# Patient Record
Sex: Female | Born: 1945 | Race: White | Hispanic: No | Smoking: Never smoker
Health system: Southern US, Community
[De-identification: ages and names within clinical notes are randomized; demographics above are authoritative.]

## PROBLEM LIST (undated history)

## (undated) DIAGNOSIS — E890 Postprocedural hypothyroidism: Secondary | ICD-10-CM

## (undated) DIAGNOSIS — T7840XA Allergy, unspecified, initial encounter: Secondary | ICD-10-CM

## (undated) DIAGNOSIS — N840 Polyp of corpus uteri: Secondary | ICD-10-CM

## (undated) DIAGNOSIS — R1900 Intra-abdominal and pelvic swelling, mass and lump, unspecified site: Secondary | ICD-10-CM

## (undated) DIAGNOSIS — I059 Rheumatic mitral valve disease, unspecified: Secondary | ICD-10-CM

## (undated) DIAGNOSIS — M810 Age-related osteoporosis without current pathological fracture: Secondary | ICD-10-CM

## (undated) DIAGNOSIS — I10 Essential (primary) hypertension: Secondary | ICD-10-CM

## (undated) DIAGNOSIS — Z973 Presence of spectacles and contact lenses: Secondary | ICD-10-CM

## (undated) DIAGNOSIS — I340 Nonrheumatic mitral (valve) insufficiency: Secondary | ICD-10-CM

## (undated) DIAGNOSIS — M8580 Other specified disorders of bone density and structure, unspecified site: Secondary | ICD-10-CM

## (undated) DIAGNOSIS — E079 Disorder of thyroid, unspecified: Secondary | ICD-10-CM

## (undated) DIAGNOSIS — E210 Primary hyperparathyroidism: Secondary | ICD-10-CM

## (undated) DIAGNOSIS — I341 Nonrheumatic mitral (valve) prolapse: Secondary | ICD-10-CM

## (undated) DIAGNOSIS — E785 Hyperlipidemia, unspecified: Secondary | ICD-10-CM

## (undated) HISTORY — PX: BREAST SURGERY: SHX581

## (undated) HISTORY — DX: Hyperlipidemia, unspecified: E78.5

## (undated) HISTORY — DX: Allergy, unspecified, initial encounter: T78.40XA

## (undated) HISTORY — DX: Other specified disorders of bone density and structure, unspecified site: M85.80

## (undated) HISTORY — PX: BREAST BIOPSY: SHX20

## (undated) HISTORY — PX: COLONOSCOPY: SHX174

## (undated) HISTORY — DX: Rheumatic mitral valve disease, unspecified: I05.9

## (undated) HISTORY — PX: WISDOM TOOTH EXTRACTION: SHX21

## (undated) HISTORY — DX: Disorder of thyroid, unspecified: E07.9

## (undated) HISTORY — DX: Intra-abdominal and pelvic swelling, mass and lump, unspecified site: R19.00

---

## 1988-03-15 HISTORY — PX: BREAST SURGERY: SHX581

## 1998-05-06 ENCOUNTER — Other Ambulatory Visit: Admission: RE | Admit: 1998-05-06 | Discharge: 1998-05-06 | Payer: Self-pay | Admitting: Obstetrics and Gynecology

## 1999-08-12 ENCOUNTER — Other Ambulatory Visit: Admission: RE | Admit: 1999-08-12 | Discharge: 1999-08-12 | Payer: Self-pay | Admitting: Obstetrics and Gynecology

## 2000-09-19 ENCOUNTER — Other Ambulatory Visit: Admission: RE | Admit: 2000-09-19 | Discharge: 2000-09-19 | Payer: Self-pay | Admitting: Obstetrics and Gynecology

## 2001-10-06 ENCOUNTER — Other Ambulatory Visit: Admission: RE | Admit: 2001-10-06 | Discharge: 2001-10-06 | Payer: Self-pay | Admitting: Obstetrics and Gynecology

## 2002-11-05 ENCOUNTER — Other Ambulatory Visit: Admission: RE | Admit: 2002-11-05 | Discharge: 2002-11-05 | Payer: Self-pay | Admitting: Obstetrics and Gynecology

## 2003-11-11 ENCOUNTER — Other Ambulatory Visit: Admission: RE | Admit: 2003-11-11 | Discharge: 2003-11-11 | Payer: Self-pay | Admitting: Obstetrics and Gynecology

## 2004-11-12 ENCOUNTER — Other Ambulatory Visit: Admission: RE | Admit: 2004-11-12 | Discharge: 2004-11-12 | Payer: Self-pay | Admitting: Obstetrics and Gynecology

## 2005-11-16 ENCOUNTER — Other Ambulatory Visit: Admission: RE | Admit: 2005-11-16 | Discharge: 2005-11-16 | Payer: Self-pay | Admitting: Obstetrics and Gynecology

## 2005-12-06 ENCOUNTER — Encounter: Admission: RE | Admit: 2005-12-06 | Discharge: 2005-12-06 | Payer: Self-pay | Admitting: Endocrinology

## 2005-12-13 DIAGNOSIS — Z8639 Personal history of other endocrine, nutritional and metabolic disease: Secondary | ICD-10-CM

## 2005-12-13 HISTORY — DX: Personal history of other endocrine, nutritional and metabolic disease: Z86.39

## 2005-12-23 ENCOUNTER — Encounter: Admission: RE | Admit: 2005-12-23 | Discharge: 2005-12-23 | Payer: Self-pay | Admitting: Endocrinology

## 2005-12-23 IMAGING — NM NM RAI THERAPY FOR HYPERTHYROIDISM
1 series · 1 of 1 positions shown · non-contrast
Comparison: none

CLINICAL DATA: Hyperthyroidism.  
 NM KLPIGBB THERAPY FOR HYPERTHYROIDISM:
 Today the patient was treated with 16.1 mCi [0X] in oral form.  The patient was given appropriate instructions by the technologist and myself.

[st statics, dual detec · 1 of 1 slices shown]
[im 1/1]
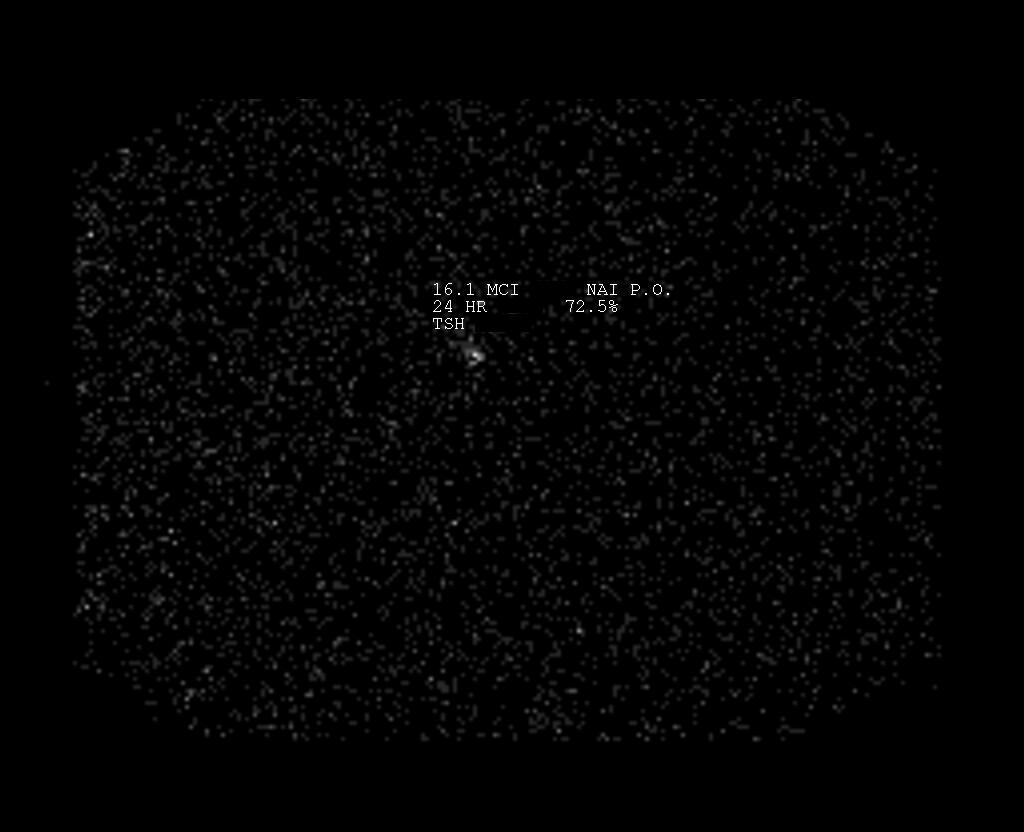

[1 of 1 positions shown; findings below may reference images not displayed]

IMPRESSION: KLPIGBB therapy today with 16.1 mCi [0X].

## 2006-11-24 ENCOUNTER — Other Ambulatory Visit: Admission: RE | Admit: 2006-11-24 | Discharge: 2006-11-24 | Payer: Self-pay | Admitting: Obstetrics and Gynecology

## 2007-11-30 ENCOUNTER — Ambulatory Visit: Payer: Self-pay | Admitting: Obstetrics and Gynecology

## 2007-11-30 ENCOUNTER — Encounter: Payer: Self-pay | Admitting: Obstetrics and Gynecology

## 2007-11-30 ENCOUNTER — Other Ambulatory Visit: Admission: RE | Admit: 2007-11-30 | Discharge: 2007-11-30 | Payer: Self-pay | Admitting: Obstetrics and Gynecology

## 2007-12-19 ENCOUNTER — Ambulatory Visit: Payer: Self-pay | Admitting: Obstetrics and Gynecology

## 2008-12-02 ENCOUNTER — Ambulatory Visit: Payer: Self-pay | Admitting: Obstetrics and Gynecology

## 2008-12-02 ENCOUNTER — Other Ambulatory Visit: Admission: RE | Admit: 2008-12-02 | Discharge: 2008-12-02 | Payer: Self-pay | Admitting: Obstetrics and Gynecology

## 2008-12-02 ENCOUNTER — Encounter: Payer: Self-pay | Admitting: Obstetrics and Gynecology

## 2009-12-16 ENCOUNTER — Ambulatory Visit: Payer: Self-pay | Admitting: Obstetrics and Gynecology

## 2009-12-16 ENCOUNTER — Other Ambulatory Visit: Admission: RE | Admit: 2009-12-16 | Discharge: 2009-12-16 | Payer: Self-pay | Admitting: Obstetrics and Gynecology

## 2010-01-15 ENCOUNTER — Ambulatory Visit: Payer: Self-pay | Admitting: Obstetrics and Gynecology

## 2010-10-05 ENCOUNTER — Other Ambulatory Visit: Payer: Self-pay | Admitting: *Deleted

## 2010-10-05 MED ORDER — ZOLPIDEM TARTRATE 10 MG PO TABS: 10.0000 mg | ORAL_TABLET | Freq: Every evening | ORAL | Status: DC | PRN

## 2010-10-05 NOTE — Telephone Encounter (Signed)
Faxed refill received. Please fill out refills. Last filled 05/24/10. Will be e-scribed.

## 2010-10-05 NOTE — Telephone Encounter (Signed)
Called in refill for Ambien 10 mg #30 no refills to [facility]

## 2011-01-19 ENCOUNTER — Encounter: Payer: Self-pay | Admitting: Gynecology

## 2011-01-19 ENCOUNTER — Other Ambulatory Visit: Payer: Self-pay | Admitting: *Deleted

## 2011-01-19 DIAGNOSIS — N6489 Other specified disorders of breast: Secondary | ICD-10-CM

## 2011-01-20 ENCOUNTER — Other Ambulatory Visit: Payer: Self-pay | Admitting: Obstetrics and Gynecology

## 2011-01-20 DIAGNOSIS — N6489 Other specified disorders of breast: Secondary | ICD-10-CM

## 2011-01-21 ENCOUNTER — Encounter: Payer: Self-pay | Admitting: Obstetrics and Gynecology

## 2011-02-02 ENCOUNTER — Other Ambulatory Visit (HOSPITAL_COMMUNITY)
Admission: RE | Admit: 2011-02-02 | Discharge: 2011-02-02 | Disposition: A | Discharge status: Home / Self Care | Payer: BC Managed Care – PPO | Source: Ambulatory Visit | Attending: Obstetrics and Gynecology | Admitting: Obstetrics and Gynecology

## 2011-02-02 ENCOUNTER — Ambulatory Visit (INDEPENDENT_AMBULATORY_CARE_PROVIDER_SITE_OTHER): Payer: BC Managed Care – PPO | Admitting: Obstetrics and Gynecology

## 2011-02-02 ENCOUNTER — Encounter: Payer: Self-pay | Admitting: Obstetrics and Gynecology

## 2011-02-02 VITALS — BP 124/76 | Ht 63.5 in | Wt 108.0 lb

## 2011-02-02 DIAGNOSIS — M949 Disorder of cartilage, unspecified: Secondary | ICD-10-CM

## 2011-02-02 DIAGNOSIS — Z01419 Encounter for gynecological examination (general) (routine) without abnormal findings: Secondary | ICD-10-CM | POA: Insufficient documentation

## 2011-02-02 DIAGNOSIS — I341 Nonrheumatic mitral (valve) prolapse: Secondary | ICD-10-CM | POA: Insufficient documentation

## 2011-02-02 DIAGNOSIS — E079 Disorder of thyroid, unspecified: Secondary | ICD-10-CM | POA: Insufficient documentation

## 2011-02-02 DIAGNOSIS — R82998 Other abnormal findings in urine: Secondary | ICD-10-CM

## 2011-02-02 DIAGNOSIS — M8580 Other specified disorders of bone density and structure, unspecified site: Secondary | ICD-10-CM

## 2011-02-02 DIAGNOSIS — N952 Postmenopausal atrophic vaginitis: Secondary | ICD-10-CM

## 2011-02-02 MED ORDER — ZOLPIDEM TARTRATE 10 MG PO TABS: 10.0000 mg | ORAL_TABLET | Freq: Every evening | ORAL | Status: DC | PRN

## 2011-02-02 NOTE — Progress Notes (Signed)
The patient came back to see me today for her annual GYN visit. She had a go back for followup mammogram but all was okay. She has low bone mass without an elevated FRAX risk. She is taking her calcium and vitamin D. She's had no fractures. She is having no vaginal bleeding or pelvic pain. She does have vaginal dryness but is not sexually active due to her husband's dementia.  ROS: 12 system review done. Only pertinent positives other than above are mitral valve prolapse and hypothyroidism both been treated.  Physical examination: Kennon Portela present HEENT within normal limits. Neck: Thyroid not large. No masses. Supraclavicular nodes: not enlarged. Breasts: Examined in both sitting midline position. No skin changes and no masses. Abdomen: Soft no guarding rebound or masses or hernia. Pelvic: External: Within normal limits. BUS: Within normal limits. Vaginal:within normal limits. Poor estrogen effect with decreased diameter of vagina. No evidence of cystocele rectocele or enterocele. Cervix: clean. Uterus: Normal size and shape. Adnexa: No masses. Rectovaginal exam: Confirmatory and negative. Extremities: Within normal limits.  Assessment: #1. Atrophic vaginitis #2. Low bone mass  Plan: Continue yearly mammograms. Bone density in 2013.

## 2011-02-02 NOTE — Progress Notes (Signed)
Addended by: Landis Martins R on: 02/02/2011 02:55 PM   Modules accepted: Orders

## 2011-02-04 LAB — URINE CULTURE: Colony Count: 9000

## 2011-11-30 ENCOUNTER — Other Ambulatory Visit: Payer: Self-pay | Admitting: Obstetrics and Gynecology

## 2011-12-02 NOTE — Telephone Encounter (Signed)
Rx phone into pharmacy. .

## 2012-01-27 ENCOUNTER — Encounter: Payer: Self-pay | Admitting: Obstetrics and Gynecology

## 2012-02-08 ENCOUNTER — Encounter: Payer: Self-pay | Admitting: Obstetrics and Gynecology

## 2012-02-08 ENCOUNTER — Other Ambulatory Visit (HOSPITAL_COMMUNITY)
Admission: RE | Admit: 2012-02-08 | Discharge: 2012-02-08 | Disposition: A | Discharge status: Home / Self Care | Payer: Medicare Other | Source: Ambulatory Visit | Attending: Obstetrics and Gynecology | Admitting: Obstetrics and Gynecology

## 2012-02-08 ENCOUNTER — Ambulatory Visit (INDEPENDENT_AMBULATORY_CARE_PROVIDER_SITE_OTHER): Payer: Medicare Other | Admitting: Obstetrics and Gynecology

## 2012-02-08 VITALS — BP 124/80 | Ht 64.0 in | Wt 101.0 lb

## 2012-02-08 DIAGNOSIS — R232 Flushing: Secondary | ICD-10-CM

## 2012-02-08 DIAGNOSIS — M8580 Other specified disorders of bone density and structure, unspecified site: Secondary | ICD-10-CM

## 2012-02-08 DIAGNOSIS — R350 Frequency of micturition: Secondary | ICD-10-CM

## 2012-02-08 DIAGNOSIS — N952 Postmenopausal atrophic vaginitis: Secondary | ICD-10-CM

## 2012-02-08 DIAGNOSIS — N951 Menopausal and female climacteric states: Secondary | ICD-10-CM

## 2012-02-08 DIAGNOSIS — M949 Disorder of cartilage, unspecified: Secondary | ICD-10-CM

## 2012-02-08 DIAGNOSIS — Z124 Encounter for screening for malignant neoplasm of cervix: Secondary | ICD-10-CM | POA: Insufficient documentation

## 2012-02-08 NOTE — Patient Instructions (Signed)
Schedule bone density.    

## 2012-02-08 NOTE — Progress Notes (Signed)
Patient came back to see me today for further followup. She is getting divorced. Her husband has dementia and he is changed  Completely. They have not been sexually active for a while. She has always had normal Pap smears. Her last Pap smear was 2012. She is having no vaginal bleeding. She is having no pelvic pain. She does have significant urinary frequency but drinks a lot of water. She will have occasional nocturia. She has no dysuria, urgency of urination or incontinence. She has osteopenia on bone density. She does not have an elevated fracture risk. Her bone densities remained stable. She has not had a fracture. It is time for followup bone density. She does have atrophic vaginitis but is not clinically symptomatic probably, she is not sexually active. She will have some hot flashes but they're tolerable.Her labs done by her PCP.  ROS: 12 system review done. Pertinent positives above. Patient has significant mitral valve prolapse and is followed by her cardiologist and is stable. Patient also is hypothyroid.  Physical examination:Kim Julian Reil present. HEENT within normal limits. Neck: Thyroid not large. No masses. Supraclavicular nodes: not enlarged. Breasts: Examined in both sitting and lying  position. No skin changes and no masses. Abdomen: Soft no guarding rebound or masses or hernia. Pelvic: External: Within normal limits. BUS: Within normal limits. Vaginal:within normal limits. Poor estrogen effect. No evidence of cystocele rectocele or enterocele. Cervix: clean. Uterus: Normal size and shape. Adnexa: No masses. Rectovaginal exam: Confirmatory and negative. Extremities: Within normal limits.  Assessment: #1. Atrophic vaginitis #2. Osteopenia #3. Urinary frequency #4. Mild menopausal symptoms  Plan: Discussed vaginal estrogen if she becomes sexually active. Continue yearly mammograms. Bone density.The new Pap smear guidelines were discussed with the patient. Pap done at patient's request.

## 2012-03-20 ENCOUNTER — Other Ambulatory Visit: Payer: Self-pay | Admitting: Gynecology

## 2012-03-20 DIAGNOSIS — M8580 Other specified disorders of bone density and structure, unspecified site: Secondary | ICD-10-CM

## 2012-03-30 ENCOUNTER — Ambulatory Visit (INDEPENDENT_AMBULATORY_CARE_PROVIDER_SITE_OTHER): Payer: Medicare Other

## 2012-03-30 DIAGNOSIS — M949 Disorder of cartilage, unspecified: Secondary | ICD-10-CM

## 2012-03-30 DIAGNOSIS — M8580 Other specified disorders of bone density and structure, unspecified site: Secondary | ICD-10-CM

## 2012-04-03 ENCOUNTER — Encounter: Payer: Self-pay | Admitting: Gynecology

## 2012-07-04 ENCOUNTER — Other Ambulatory Visit (HOSPITAL_COMMUNITY): Payer: Self-pay | Admitting: Cardiovascular Disease

## 2012-07-04 DIAGNOSIS — I10 Essential (primary) hypertension: Secondary | ICD-10-CM

## 2012-07-07 ENCOUNTER — Telehealth (HOSPITAL_COMMUNITY): Payer: Self-pay | Admitting: *Deleted

## 2012-07-20 ENCOUNTER — Ambulatory Visit (HOSPITAL_COMMUNITY)
Admission: RE | Admit: 2012-07-20 | Discharge: 2012-07-20 | Disposition: A | Discharge status: Home / Self Care | Payer: Medicare Other | Source: Ambulatory Visit | Attending: Cardiovascular Disease | Admitting: Cardiovascular Disease

## 2012-07-20 DIAGNOSIS — I059 Rheumatic mitral valve disease, unspecified: Secondary | ICD-10-CM | POA: Insufficient documentation

## 2012-07-20 DIAGNOSIS — I10 Essential (primary) hypertension: Secondary | ICD-10-CM | POA: Insufficient documentation

## 2012-07-20 DIAGNOSIS — I509 Heart failure, unspecified: Secondary | ICD-10-CM | POA: Insufficient documentation

## 2012-07-20 DIAGNOSIS — E785 Hyperlipidemia, unspecified: Secondary | ICD-10-CM | POA: Insufficient documentation

## 2012-07-20 DIAGNOSIS — I079 Rheumatic tricuspid valve disease, unspecified: Secondary | ICD-10-CM | POA: Insufficient documentation

## 2012-07-20 NOTE — Progress Notes (Signed)
[facility]   2D echo completed 07/20/2012.   Cindy Edvardo Honse, RDCS  

## 2012-08-11 ENCOUNTER — Telehealth: Payer: Self-pay | Admitting: Cardiovascular Disease

## 2012-08-11 NOTE — Telephone Encounter (Signed)
Left message for patient to call me back to reschedule

## 2012-08-24 ENCOUNTER — Encounter: Payer: Self-pay | Admitting: Cardiovascular Disease

## 2012-08-24 ENCOUNTER — Ambulatory Visit (INDEPENDENT_AMBULATORY_CARE_PROVIDER_SITE_OTHER): Payer: Medicare Other | Admitting: Cardiology

## 2012-08-24 ENCOUNTER — Encounter: Payer: Self-pay | Admitting: Cardiology

## 2012-08-24 VITALS — BP 120/78 | HR 58 | Ht 64.0 in | Wt 101.3 lb

## 2012-08-24 DIAGNOSIS — I059 Rheumatic mitral valve disease, unspecified: Secondary | ICD-10-CM

## 2012-08-24 DIAGNOSIS — I340 Nonrheumatic mitral (valve) insufficiency: Secondary | ICD-10-CM

## 2012-08-24 NOTE — Progress Notes (Signed)
08/24/2012 Brenda Cobb   03-Nov-1945  [id number]  Primary Physicia Olen Cordial, MD Primary Cardiologist: Dr Allyson Sabal  HPI:  Pleasant 67 y/o female followed by Dr Allyson Sabal with MVP and significant MR. She is now getting echos every 6 months. Fortunately she remains asymptomatic. She denies any chest pain, palpitations, or SOB. She has been on Avapro for some time for MR, not HTN, and she tolerates this well.   Current Outpatient Prescriptions  Medication Sig Dispense Refill  . aspirin 325 MG tablet Take 325 mg by mouth daily.      . Calcium Carbonate-Vitamin D (CALCIUM + D PO) Take by mouth.        . Irbesartan (AVAPRO PO) Take 150 mg by mouth daily.       Marland Kitchen levothyroxine (SYNTHROID, LEVOTHROID) 100 MCG tablet Take 100 mcg by mouth daily.        . Multiple Vitamin (MULTIVITAMIN) tablet Take 1 tablet by mouth daily.        Marland Kitchen zolpidem (AMBIEN) 10 MG tablet Take 1 tablet (10 mg total) by mouth at bedtime as needed.  30 tablet  4   No current facility-administered medications for this visit.    No Known Allergies  History   Social History  . Marital Status: Married    Spouse Name: N/A    Number of Children: N/A  . Years of Education: N/A   Occupational History  . Not on file.   Social History Main Topics  . Smoking status: Never Smoker   . Smokeless tobacco: Not on file  . Alcohol Use: 2.0 oz/week    4 drink(s) per week  . Drug Use: Not on file  . Sexually Active: No   Other Topics Concern  . Not on file   Social History Narrative  . No narrative on file     Review of Systems: General: negative for chills, fever, night sweats or weight changes.  Cardiovascular: negative for chest pain, dyspnea on exertion, edema, orthopnea, palpitations, paroxysmal nocturnal dyspnea or shortness of breath Dermatological: negative for rash Respiratory: negative for cough or wheezing Urologic: negative for hematuria Abdominal: negative for nausea, vomiting, diarrhea, bright red blood  per rectum, melena, or hematemesis Neurologic: negative for visual changes, syncope, or dizziness All other systems reviewed and are otherwise negative except as noted above.    Blood pressure 120/78, pulse 58, height 5\' 4"  (1.626 m), weight 101 lb 4.8 oz (45.949 kg).  General appearance: alert, cooperative, no distress and thin Lungs: clear to auscultation bilaterally Heart: regular rate and rhythm and 2/6 mid systolic murmur heard best at the MV area Extrem: no edema  EKG  EKG: normal EKG, normal sinus rhythm, unchanged from previous tracings, sinus bradycardia.  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   PLAN  Echo 6 months. Follow up with Dr Allyson Sabal.   Samir Ishaq KPA-C 08/24/2012 10:13 AM

## 2012-08-24 NOTE — Patient Instructions (Signed)
See Dr Allyson Sabal after your echo in 6 months

## 2012-08-24 NOTE — Assessment & Plan Note (Signed)
She is currently asymptomatic 

## 2013-01-06 ENCOUNTER — Other Ambulatory Visit: Payer: Self-pay | Admitting: Cardiovascular Disease

## 2013-01-08 NOTE — Telephone Encounter (Signed)
Rx was sent to pharmacy electronically. 

## 2013-01-25 ENCOUNTER — Ambulatory Visit (HOSPITAL_COMMUNITY)
Admission: RE | Admit: 2013-01-25 | Discharge: 2013-01-25 | Disposition: A | Discharge status: Home / Self Care | Payer: Medicare Other | Source: Ambulatory Visit | Attending: Cardiovascular Disease | Admitting: Cardiovascular Disease

## 2013-01-25 DIAGNOSIS — I379 Nonrheumatic pulmonary valve disorder, unspecified: Secondary | ICD-10-CM | POA: Insufficient documentation

## 2013-01-25 DIAGNOSIS — I340 Nonrheumatic mitral (valve) insufficiency: Secondary | ICD-10-CM

## 2013-01-25 DIAGNOSIS — I059 Rheumatic mitral valve disease, unspecified: Secondary | ICD-10-CM

## 2013-01-25 DIAGNOSIS — I079 Rheumatic tricuspid valve disease, unspecified: Secondary | ICD-10-CM | POA: Insufficient documentation

## 2013-01-25 NOTE — Progress Notes (Signed)
2D Echo Performed 01/25/2013    Irlanda Croghan, RCS  

## 2013-01-31 NOTE — Progress Notes (Signed)
There has been no change in her microdot prolapse and MR appeared her LV size is normal as is her LV function. I will see her back on December 8  JJB

## 2013-02-02 ENCOUNTER — Encounter: Payer: Self-pay | Admitting: Gynecology

## 2013-02-05 ENCOUNTER — Telehealth: Payer: Self-pay | Admitting: *Deleted

## 2013-02-05 ENCOUNTER — Encounter: Payer: Self-pay | Admitting: *Deleted

## 2013-02-05 DIAGNOSIS — I341 Nonrheumatic mitral (valve) prolapse: Secondary | ICD-10-CM

## 2013-02-05 NOTE — Telephone Encounter (Signed)
Message copied by Marella Bile on Mon Feb 05, 2013  1:42 PM ------      Message from: Runell Gess      Created: Sun Feb 04, 2013 12:59 PM       Unchanged from prior studies. Repeat in 6 months ------

## 2013-02-05 NOTE — Telephone Encounter (Signed)
Order placed for repeat echo in 6 months 

## 2013-02-13 ENCOUNTER — Encounter: Payer: Self-pay | Admitting: Gynecology

## 2013-02-13 ENCOUNTER — Ambulatory Visit (INDEPENDENT_AMBULATORY_CARE_PROVIDER_SITE_OTHER): Payer: Medicare Other | Admitting: Gynecology

## 2013-02-13 VITALS — BP 120/76 | Ht 64.0 in | Wt 98.0 lb

## 2013-02-13 DIAGNOSIS — N952 Postmenopausal atrophic vaginitis: Secondary | ICD-10-CM

## 2013-02-13 DIAGNOSIS — I341 Nonrheumatic mitral (valve) prolapse: Secondary | ICD-10-CM

## 2013-02-13 DIAGNOSIS — M899 Disorder of bone, unspecified: Secondary | ICD-10-CM

## 2013-02-13 DIAGNOSIS — M8580 Other specified disorders of bone density and structure, unspecified site: Secondary | ICD-10-CM

## 2013-02-13 DIAGNOSIS — I059 Rheumatic mitral valve disease, unspecified: Secondary | ICD-10-CM

## 2013-02-13 NOTE — Patient Instructions (Signed)
Schedule colonoscopy with [facility] at [phone] or [facility] at [phone]  

## 2013-02-13 NOTE — Progress Notes (Signed)
Brenda Cobb 25-May-1945 [id number]        67 y.o.  G0P0 for annual exam.  Former patient of Dr. Eda Paschal. Several issues noted below.  Past medical history,surgical history, problem list, medications, allergies, family history and social history were all reviewed and documented in the EPIC chart.  ROS:  Performed and pertinent positives and negatives are included in the history, assessment and plan .  Exam: Kim assistant Filed Vitals:   02/13/13 0947  BP: 120/76  Height: 5\' 4"  (1.626 m)  Weight: 98 lb (44.453 kg)   General appearance  Normal Skin grossly normal Head/Neck normal with no cervical or supraclavicular adenopathy thyroid normal Lungs  clear Cardiac RR, 2-3/6 systolic ejection murmur with click Abdominal  soft, nontender, without masses, organomegaly or hernia Breasts  examined lying and sitting without masses, retractions, discharge or axillary adenopathy. Pelvic  Ext/BUS/vagina  Normal with atrophic changes  Cervix  Normal with atrophic changes  Uterus  axial to retroverted, normal size, shape and contour, midline and mobile nontender   Adnexa  Without masses or tenderness    Anus and perineum  Normal   Rectovaginal  Normal sphincter tone without palpated masses or tenderness.    Assessment/Plan:  67 y.o. G0P0 female for annual exam.   1. Postmenopausal/atrophic genital changes. Patient without significant symptoms of hot flushes, night sweats, vaginal dryness. Is not sexually active. No bleeding. Will monitor at present. Call if any bleeding or other issues. 2. Osteopenia. DEXA 03/2012 with T score -1.2. FRAX 6.7%/0.6%. Repeat DEXA at two-year interval. Increase calcium vitamin D reviewed. 3. Mitral valve prolapse. Actively being followed by cardiology. Does require SBE prophylaxis. 4. Mammography 01/2013. Continue with annual mammography. SBE monthly reviewed. 5. Pap smear 2013. No Pap smear done today. No history of significant abnormal Pap smears. Options to  stop screening altogether as she is over the age of 10 versus less frequent screening intervals discussed. Will readdress on an annual basis. 6. Colonoscopy coming due next year. Patient agrees to arrange. 7. Health maintenance. No blood work done as this is all done through her other physician's offices. Followup one year, sooner as needed.   Note: This document was prepared with digital dictation and possible smart phrase technology. Any transcriptional errors that result from this process are unintentional.   [name] MD, 10:23 AM 02/13/2013

## 2013-02-19 ENCOUNTER — Ambulatory Visit (INDEPENDENT_AMBULATORY_CARE_PROVIDER_SITE_OTHER): Payer: Medicare Other | Admitting: Cardiovascular Disease

## 2013-02-19 ENCOUNTER — Encounter: Payer: Self-pay | Admitting: Cardiovascular Disease

## 2013-02-19 VITALS — BP 110/60 | HR 60 | Ht 64.0 in | Wt 99.6 lb

## 2013-02-19 DIAGNOSIS — I340 Nonrheumatic mitral (valve) insufficiency: Secondary | ICD-10-CM

## 2013-02-19 DIAGNOSIS — I341 Nonrheumatic mitral (valve) prolapse: Secondary | ICD-10-CM

## 2013-02-19 DIAGNOSIS — I059 Rheumatic mitral valve disease, unspecified: Secondary | ICD-10-CM

## 2013-02-19 MED ORDER — AMOXICILLIN 500 MG PO TABS: 500.0000 mg | ORAL_TABLET | Freq: Two times a day (BID) | ORAL | Status: DC

## 2013-02-19 NOTE — Patient Instructions (Signed)
Your physician recommends that you schedule a follow-up appointment in: 6 months with luke  Your physician recommends that you schedule a follow-up appointment in: 1 year with Dr Allyson Sabal  Your physician has requested that you have an echocardiogram. Echocardiography is a painless test that uses sound waves to create images of your heart. It provides your doctor with information about the size and shape of your heart and how well your heart's chambers and valves are working. This procedure takes approximately one hour. There are no restrictions for this procedure. In 6 months and in 1 year

## 2013-02-19 NOTE — Assessment & Plan Note (Addendum)
Long history of MVP/MR. She gets semiannual to the echo is the most recent one was last month that showed severe bileaflet problems with severe MR, preserved LV size and function. She is completely asymptomatic. She does take oral antibiotics for SBE prophylaxis.

## 2013-02-19 NOTE — Progress Notes (Signed)
02/19/2013 Brenda Cobb   [birth year]  [id number]  Primary Physician Brenda Cordial, Cobb Primary Cardiologist: Brenda Cobb Brenda Cobb   HPI:  The patient returns today for followup. She is a delightful, 67 year old, thin and fit-appearing, married Caucasian female with no children who I last saw in the office 6 months ago. She has a history of bileaflet severe mitral valve prolapse with regurgitation which she is totally asymptomatic from. She also has mild hyperlipidemia. Her recent 2D echo performed January 07, 2012, revealed normal LV size and function with mild concentric LVH, mildly dilated left atrium of 42 mm. Her echo really has not changed significantly since it was done prior to that.since I saw her a year ago she is completely asymptomatic. She did see Brenda Cobb PAC back 6 months ago. Her most recent 2-D echo is completely without change.    Current Outpatient Prescriptions  Medication Sig Dispense Refill  . aspirin 325 MG tablet Take 325 mg by mouth daily.      . Calcium Carbonate-Vitamin D (CALCIUM + D PO) Take by mouth.        . irbesartan (AVAPRO) 150 MG tablet TAKE 1 TABLET BY MOUTH EVERY DAY  30 tablet  6  . levothyroxine (SYNTHROID, LEVOTHROID) 100 MCG tablet Take 100 mcg by mouth daily.        . Multiple Vitamin (MULTIVITAMIN) tablet Take 1 tablet by mouth daily.        Marland Kitchen zolpidem (AMBIEN) 10 MG tablet Take 1 tablet (10 mg total) by mouth at bedtime as needed.  30 tablet  4   No current facility-administered medications for this visit.    No Known Allergies  History   Social History  . Marital Status: Married    Spouse Name: N/A    Number of Children: N/A  . Years of Education: N/A   Occupational History  . Not on file.   Social History Main Topics  . Smoking status: Never Smoker   . Smokeless tobacco: Never Used  . Alcohol Use: 2.0 oz/week    4 drink(s) per week  . Drug Use: No  . Sexual Activity: No   Other Topics  Concern  . Not on file   Social History Narrative  . No narrative on file     Review of Systems: General: negative for chills, fever, night sweats or weight changes.  Cardiovascular: negative for chest pain, dyspnea on exertion, edema, orthopnea, palpitations, paroxysmal nocturnal dyspnea or shortness of breath Dermatological: negative for rash Respiratory: negative for cough or wheezing Urologic: negative for hematuria Abdominal: negative for nausea, vomiting, diarrhea, bright red blood per rectum, melena, or hematemesis Neurologic: negative for visual changes, syncope, or dizziness All other systems reviewed and are otherwise negative except as noted above.    Blood pressure 110/60, pulse 60, height 5\' 4"  (1.626 m), weight 99 lb 9.6 oz (45.178 kg).  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 She gets semiannual to the echo is the most recent one was last month  that showed severe bileaflet problems with severe MR, preserved LV size and function. She is completely asymptomatic. She does take oral antibiotics for SBE prophylaxis.      Brenda Cobb FACP,FACC,FAHA, [facility] 02/19/2013 11:46 AM

## 2013-02-20 ENCOUNTER — Encounter: Payer: Self-pay | Admitting: Cardiovascular Disease

## 2013-07-31 ENCOUNTER — Other Ambulatory Visit: Payer: Self-pay | Admitting: Cardiovascular Disease

## 2013-07-31 NOTE — Telephone Encounter (Signed)
Rx was sent to pharmacy electronically. 

## 2013-08-16 ENCOUNTER — Ambulatory Visit (HOSPITAL_COMMUNITY)
Admission: RE | Admit: 2013-08-16 | Discharge: 2013-08-16 | Disposition: A | Discharge status: Home / Self Care | Payer: Medicare Other | Source: Ambulatory Visit | Attending: Internal Medicine | Admitting: Internal Medicine

## 2013-08-16 DIAGNOSIS — I059 Rheumatic mitral valve disease, unspecified: Secondary | ICD-10-CM

## 2013-08-16 DIAGNOSIS — I340 Nonrheumatic mitral (valve) insufficiency: Secondary | ICD-10-CM

## 2013-08-16 DIAGNOSIS — I341 Nonrheumatic mitral (valve) prolapse: Secondary | ICD-10-CM

## 2013-08-16 NOTE — Progress Notes (Signed)
2D Echo Performed 08/16/2013    Zebulun Deman, RCS  

## 2013-08-23 ENCOUNTER — Encounter: Payer: Self-pay | Admitting: *Deleted

## 2013-08-31 ENCOUNTER — Ambulatory Visit (INDEPENDENT_AMBULATORY_CARE_PROVIDER_SITE_OTHER): Payer: Medicare Other | Admitting: Cardiology

## 2013-08-31 ENCOUNTER — Encounter: Payer: Self-pay | Admitting: Cardiology

## 2013-08-31 VITALS — BP 100/60 | HR 60 | Ht 64.0 in | Wt 99.0 lb

## 2013-08-31 DIAGNOSIS — I340 Nonrheumatic mitral (valve) insufficiency: Secondary | ICD-10-CM

## 2013-08-31 DIAGNOSIS — E079 Disorder of thyroid, unspecified: Secondary | ICD-10-CM

## 2013-08-31 DIAGNOSIS — I059 Rheumatic mitral valve disease, unspecified: Secondary | ICD-10-CM

## 2013-08-31 DIAGNOSIS — I341 Nonrheumatic mitral (valve) prolapse: Secondary | ICD-10-CM

## 2013-08-31 DIAGNOSIS — R011 Cardiac murmur, unspecified: Secondary | ICD-10-CM

## 2013-08-31 NOTE — Assessment & Plan Note (Signed)
Hyporthyroid

## 2013-08-31 NOTE — Assessment & Plan Note (Signed)
Asymptomatic. 

## 2013-08-31 NOTE — Assessment & Plan Note (Signed)
On SBE prophylaxis

## 2013-08-31 NOTE — Progress Notes (Signed)
    08/31/2013 Brenda Cobb   1946-01-29  [id number]  Primary Physicia Talmage Coin, MD Primary Cardiologist: Dr Gwenlyn Found  HPI:  68 year old, thin female with a history of bileaflet mitral valve prolapse with regurgitation which she is  asymptomatic from. Her recent 2D echo performed August 16 2013, revealed normal LV size and function with Moderate prolapse, involving the anterior leafletand the posterior leaflet. There was moderate regurgitation. There was mild-moderate Tricuspid regurgitation. Her echo looked better than her previous echo Nov 2014. She is asymptomatic- she denies chest pain, palpitations, or SOB.   .    Current Outpatient Prescriptions  Medication Sig Dispense Refill  . amoxicillin (AMOXIL) 500 MG tablet Take 1 tablet (500 mg total) by mouth 2 (two) times daily.  24 tablet  2  . aspirin 325 MG tablet Take 325 mg by mouth daily.      . Calcium Carbonate-Vitamin D (CALCIUM + D PO) Take by mouth.        . irbesartan (AVAPRO) 150 MG tablet TAKE 1 TABLET BY MOUTH EVERY DAY  30 tablet  7  . levothyroxine (SYNTHROID, LEVOTHROID) 100 MCG tablet Take 100 mcg by mouth daily.        . Multiple Vitamin (MULTIVITAMIN) tablet Take 1 tablet by mouth daily.        Marland Kitchen zolpidem (AMBIEN) 10 MG tablet Take 1 tablet (10 mg total) by mouth at bedtime as needed.  30 tablet  4   No current facility-administered medications for this visit.    No Known Allergies  History   Social History  . Marital Status: Married    Spouse Name: N/A    Number of Children: N/A  . Years of Education: N/A   Occupational History  . Not on file.   Social History Main Topics  . Smoking status: Never Smoker   . Smokeless tobacco: Never Used  . Alcohol Use: 2.0 oz/week    4 drink(s) per week  . Drug Use: No  . Sexual Activity: No   Other Topics Concern  . Not on file   Social History Narrative  . No narrative on file     Review of Systems: General: negative for chills, fever, night sweats or  weight changes.  Cardiovascular: negative for chest pain, dyspnea on exertion, edema, orthopnea, palpitations, paroxysmal nocturnal dyspnea or shortness of breath Dermatological: negative for rash Respiratory: negative for cough or wheezing Urologic: negative for hematuria Abdominal: negative for nausea, vomiting, diarrhea, bright red blood per rectum, melena, or hematemesis Neurologic: negative for visual changes, syncope, or dizziness All other systems reviewed and are otherwise negative except as noted above.    Blood pressure 100/60, pulse 60, height 5\' 4"  (1.626 m), weight 99 lb (44.906 kg).  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 Her previous echos have shown severe prolapse and MR. He gets echos every 6 months. Her B/P is a little low on Avapro 150 mg but she assures me she has no orthostatic symptoms.   KILROY,LUKE KPA-C 08/31/2013 8:56 AM

## 2013-08-31 NOTE — Patient Instructions (Addendum)
1. Continue taking the same meds your on  2.we will schedule an ECHO in 6 months and a follow up appt. With Dr. Gwenlyn Found after that ECHO

## 2013-09-07 ENCOUNTER — Ambulatory Visit: Payer: Medicare Other | Admitting: Cardiovascular Disease

## 2014-01-10 ENCOUNTER — Other Ambulatory Visit (HOSPITAL_COMMUNITY): Payer: Self-pay | Admitting: Cardiovascular Disease

## 2014-01-10 DIAGNOSIS — I341 Nonrheumatic mitral (valve) prolapse: Secondary | ICD-10-CM

## 2014-01-10 DIAGNOSIS — I340 Nonrheumatic mitral (valve) insufficiency: Secondary | ICD-10-CM

## 2014-01-11 ENCOUNTER — Encounter (HOSPITAL_COMMUNITY): Payer: Self-pay | Admitting: *Deleted

## 2014-02-06 ENCOUNTER — Encounter: Payer: Self-pay | Admitting: Gynecology

## 2014-02-14 ENCOUNTER — Ambulatory Visit (INDEPENDENT_AMBULATORY_CARE_PROVIDER_SITE_OTHER): Payer: Medicare Other | Admitting: Gynecology

## 2014-02-14 ENCOUNTER — Encounter: Payer: Self-pay | Admitting: Gynecology

## 2014-02-14 VITALS — BP 116/74 | Ht 64.0 in | Wt 104.0 lb

## 2014-02-14 DIAGNOSIS — N952 Postmenopausal atrophic vaginitis: Secondary | ICD-10-CM

## 2014-02-14 DIAGNOSIS — I341 Nonrheumatic mitral (valve) prolapse: Secondary | ICD-10-CM

## 2014-02-14 DIAGNOSIS — M8580 Other specified disorders of bone density and structure, unspecified site: Secondary | ICD-10-CM

## 2014-02-14 NOTE — Progress Notes (Signed)
Brenda Cobb 09/04/1945 [id number]        68 y.o.  G0P0 for follow up exam. Several issues noted below.  Past medical history,surgical history, problem list, medications, allergies, family history and social history were all reviewed and documented as reviewed in the EPIC chart.  ROS:  12 system ROS performed with pertinent positives and negatives included in the history, assessment and plan.   Additional significant findings :  none   Exam: Kim Counsellor Vitals:   02/14/14 0944  BP: 116/74  Height: 5\' 4"  (1.626 m)  Weight: 104 lb (47.174 kg)   General appearance:  Normal affect, orientation and appearance. Skin: Grossly normal HEENT: Without gross lesions.  No cervical or supraclavicular adenopathy. Thyroid normal.  Lungs:  Clear without wheezing, rales or rhonchi Cardiac: RR, with 4-4/8 systolic ejection murmur with click Abdominal:  Soft, nontender, without masses, guarding, rebound, organomegaly or hernia Breasts:  Examined lying and sitting without masses, retractions, discharge or axillary adenopathy. Pelvic:  Ext/BUS/vagina with atrophic changes.  Cervix with atrophic changes  Uterus retroverted, normal size, shape and contour, midline and mobile nontender   Adnexa  Without masses or tenderness    Anus and perineum  Normal   Rectovaginal  Normal sphincter tone without palpated masses or tenderness.    Assessment/Plan:  68 y.o. G0P0 female for follow up exam.  1. Postmenopausal/atrophic genital changes. Without significant symptoms of hot flashes or night sweats. No vaginal bleeding. Vagina is somewhat compromised from the atrophic changes to admit one finger. Not sexually active but had questions about if she did become sexually active. I reviewed both vaginal estrogen products and Osphena. Options for vaginal dilatation also discussed. Will follow up if/when she decides to become sexually active for further discussion. 2. Osteopenia. DEXA 03/2012 T score -1.2 FRAX  6.7%/0.6%. Plan repeat DEXA next year at two-year interval. Increased calcium and vitamin D reviewed. 3. Mammography 01/2014. Continue with annual mammography. SBE monthly reviewed. 4. Pap smear 2013. No Pap smear done today. Repeat Pap smear next year at three-year interval. Options to stop screening altogether as she is over the age of 79 and has no history of significant abnormal Pap smears discussed. Will readdress on an annual basis. 5. MVP. Actively being followed by cardiology. 6. Colonoscopy 2005. Patient is due this coming year and she knows to schedule and agrees to do so. 7. Health maintenance. No routine blood work done as this is done at her primary physician's office. Follow up for bone density this coming year otherwise follow up in 1 year, sooner as needed.     [name] MD, 10:11 AM 02/14/2014    .[email or web address]

## 2014-02-14 NOTE — Patient Instructions (Signed)
You may obtain a copy of any labs that were done today by logging onto MyChart as outlined in the instructions provided with your AVS (after visit summary). The office will not call with normal lab results but certainly if there are any significant abnormalities then we will contact you.   Health Maintenance, Female A healthy lifestyle and preventative care can promote health and wellness.  Maintain regular health, dental, and eye exams.  Eat a healthy diet. Foods like vegetables, fruits, whole grains, low-fat dairy products, and lean protein foods contain the nutrients you need without too many calories. Decrease your intake of foods high in solid fats, added sugars, and salt. Get information about a proper diet from your caregiver, if necessary.  Regular physical exercise is one of the most important things you can do for your health. Most adults should get at least 150 minutes of moderate-intensity exercise (any activity that increases your heart rate and causes you to sweat) each week. In addition, most adults need muscle-strengthening exercises on 2 or more days a week.   Maintain a healthy weight. The body mass index (BMI) is a screening tool to identify possible weight problems. It provides an estimate of body fat based on height and weight. Your caregiver can help determine your BMI, and can help you achieve or maintain a healthy weight. For adults 20 years and older:  A BMI below 18.5 is considered underweight.  A BMI of 18.5 to 24.9 is normal.  A BMI of 25 to 29.9 is considered overweight.  A BMI of 30 and above is considered obese.  Maintain normal blood lipids and cholesterol by exercising and minimizing your intake of saturated fat. Eat a balanced diet with plenty of fruits and vegetables. Blood tests for lipids and cholesterol should begin at age 61 and be repeated every 5 years. If your lipid or cholesterol levels are high, you are over 50, or you are a high risk for heart  disease, you may need your cholesterol levels checked more frequently.Ongoing high lipid and cholesterol levels should be treated with medicines if diet and exercise are not effective.  If you smoke, find out from your caregiver how to quit. If you do not use tobacco, do not start.  Lung cancer screening is recommended for adults aged 33 80 years who are at high risk for developing lung cancer because of a history of smoking. Yearly low-dose computed tomography (CT) is recommended for people who have at least a 30-pack-year history of smoking and are a current smoker or have quit within the past 15 years. A pack year of smoking is smoking an average of 1 pack of cigarettes a day for 1 year (for example: 1 pack a day for 30 years or 2 packs a day for 15 years). Yearly screening should continue until the smoker has stopped smoking for at least 15 years. Yearly screening should also be stopped for people who develop a health problem that would prevent them from having lung cancer treatment.  If you are pregnant, do not drink alcohol. If you are breastfeeding, be very cautious about drinking alcohol. If you are not pregnant and choose to drink alcohol, do not exceed 1 drink per day. One drink is considered to be 12 ounces (355 mL) of beer, 5 ounces (148 mL) of wine, or 1.5 ounces (44 mL) of liquor.  Avoid use of street drugs. Do not share needles with anyone. Ask for help if you need support or instructions about stopping  the use of drugs.  High blood pressure causes heart disease and increases the risk of stroke. Blood pressure should be checked at least every 1 to 2 years. Ongoing high blood pressure should be treated with medicines, if weight loss and exercise are not effective.  If you are 59 to 67 years old, ask your caregiver if you should take aspirin to prevent strokes.  Diabetes screening involves taking a blood sample to check your fasting blood sugar level. This should be done once every 3  years, after age 91, if you are within normal weight and without risk factors for diabetes. Testing should be considered at a younger age or be carried out more frequently if you are overweight and have at least 1 risk factor for diabetes.  Breast cancer screening is essential preventative care for women. You should practice "breast self-awareness." This means understanding the normal appearance and feel of your breasts and may include breast self-examination. Any changes detected, no matter how small, should be reported to a caregiver. Women in their 66s and 30s should have a clinical breast exam (CBE) by a caregiver as part of a regular health exam every 1 to 3 years. After age 101, women should have a CBE every year. Starting at age 100, women should consider having a mammogram (breast X-ray) every year. Women who have a family history of breast cancer should talk to their caregiver about genetic screening. Women at a high risk of breast cancer should talk to their caregiver about having an MRI and a mammogram every year.  Breast cancer gene (BRCA)-related cancer risk assessment is recommended for women who have family members with BRCA-related cancers. BRCA-related cancers include breast, ovarian, tubal, and peritoneal cancers. Having family members with these cancers may be associated with an increased risk for harmful changes (mutations) in the breast cancer genes BRCA1 and BRCA2. Results of the assessment will determine the need for genetic counseling and BRCA1 and BRCA2 testing.  The Pap test is a screening test for cervical cancer. Women should have a Pap test starting at age 57. Between ages 25 and 35, Pap tests should be repeated every 2 years. Beginning at age 37, you should have a Pap test every 3 years as long as the past 3 Pap tests have been normal. If you had a hysterectomy for a problem that was not cancer or a condition that could lead to cancer, then you no longer need Pap tests. If you are  between ages 50 and 76, and you have had normal Pap tests going back 10 years, you no longer need Pap tests. If you have had past treatment for cervical cancer or a condition that could lead to cancer, you need Pap tests and screening for cancer for at least 20 years after your treatment. If Pap tests have been discontinued, risk factors (such as a new sexual partner) need to be reassessed to determine if screening should be resumed. Some women have medical problems that increase the chance of getting cervical cancer. In these cases, your caregiver may recommend more frequent screening and Pap tests.  The human papillomavirus (HPV) test is an additional test that may be used for cervical cancer screening. The HPV test looks for the virus that can cause the cell changes on the cervix. The cells collected during the Pap test can be tested for HPV. The HPV test could be used to screen women aged 44 years and older, and should be used in women of any age  who have unclear Pap test results. After the age of 55, women should have HPV testing at the same frequency as a Pap test.  Colorectal cancer can be detected and often prevented. Most routine colorectal cancer screening begins at the age of 44 and continues through age 20. However, your caregiver may recommend screening at an earlier age if you have risk factors for colon cancer. On a yearly basis, your caregiver may provide home test kits to check for hidden blood in the stool. Use of a small camera at the end of a tube, to directly examine the colon (sigmoidoscopy or colonoscopy), can detect the earliest forms of colorectal cancer. Talk to your caregiver about this at age 86, when routine screening begins. Direct examination of the colon should be repeated every 5 to 10 years through age 13, unless early forms of pre-cancerous polyps or small growths are found.  Hepatitis C blood testing is recommended for all people born from [birth year] through 1964 and any  individual with known risks for hepatitis C.  Practice safe sex. Use condoms and avoid high-risk sexual practices to reduce the spread of sexually transmitted infections (STIs). Sexually active women aged 36 and younger should be checked for Chlamydia, which is a common sexually transmitted infection. Older women with new or multiple partners should also be tested for Chlamydia. Testing for other STIs is recommended if you are sexually active and at increased risk.  Osteoporosis is a disease in which the bones lose minerals and strength with aging. This can result in serious bone fractures. The risk of osteoporosis can be identified using a bone density scan. Women ages 20 and over and women at risk for fractures or osteoporosis should discuss screening with their caregivers. Ask your caregiver whether you should be taking a calcium supplement or vitamin D to reduce the rate of osteoporosis.  Menopause can be associated with physical symptoms and risks. Hormone replacement therapy is available to decrease symptoms and risks. You should talk to your caregiver about whether hormone replacement therapy is right for you.  Use sunscreen. Apply sunscreen liberally and repeatedly throughout the day. You should seek shade when your shadow is shorter than you. Protect yourself by wearing long sleeves, pants, a wide-brimmed hat, and sunglasses year round, whenever you are outdoors.  Notify your caregiver of new moles or changes in moles, especially if there is a change in shape or color. Also notify your caregiver if a mole is larger than the size of a pencil eraser.  Stay current with your immunizations. Document Released: 09/14/2010 Document Revised: 06/26/2012 Document Reviewed: 09/14/2010 [facility] Patient Information 2014 [employer].

## 2014-02-15 LAB — URINALYSIS W MICROSCOPIC + REFLEX CULTURE
BACTERIA UA: NONE SEEN
Bilirubin Urine: NEGATIVE
CASTS: NONE SEEN
Crystals: NONE SEEN
GLUCOSE, UA: NEGATIVE mg/dL
HGB URINE DIPSTICK: NEGATIVE
KETONES UR: NEGATIVE mg/dL
LEUKOCYTES UA: NEGATIVE
Nitrite: NEGATIVE
PH: 7.5 (ref 5.0–8.0)
PROTEIN: NEGATIVE mg/dL
Specific Gravity, Urine: 1.007 (ref 1.005–1.030)
Squamous Epithelial / LPF: NONE SEEN
Urobilinogen, UA: 0.2 mg/dL (ref 0.0–1.0)

## 2014-03-21 ENCOUNTER — Ambulatory Visit (HOSPITAL_COMMUNITY)
Admission: RE | Admit: 2014-03-21 | Discharge: 2014-03-21 | Disposition: A | Discharge status: Home / Self Care | Payer: Medicare Other | Source: Ambulatory Visit | Attending: Cardiovascular Disease | Admitting: Cardiovascular Disease

## 2014-03-21 DIAGNOSIS — I341 Nonrheumatic mitral (valve) prolapse: Secondary | ICD-10-CM | POA: Diagnosis not present

## 2014-03-21 DIAGNOSIS — I340 Nonrheumatic mitral (valve) insufficiency: Secondary | ICD-10-CM

## 2014-03-21 DIAGNOSIS — I059 Rheumatic mitral valve disease, unspecified: Secondary | ICD-10-CM

## 2014-03-21 NOTE — Progress Notes (Signed)
2D Echocardiogram Complete.  03/21/2014   Brenda Cobb [place], RDCS

## 2014-03-29 ENCOUNTER — Ambulatory Visit (INDEPENDENT_AMBULATORY_CARE_PROVIDER_SITE_OTHER): Payer: Medicare Other | Admitting: Cardiovascular Disease

## 2014-03-29 ENCOUNTER — Encounter: Payer: Self-pay | Admitting: Cardiovascular Disease

## 2014-03-29 VITALS — BP 100/58 | HR 63 | Ht 64.0 in | Wt 101.0 lb

## 2014-03-29 DIAGNOSIS — I341 Nonrheumatic mitral (valve) prolapse: Secondary | ICD-10-CM

## 2014-03-29 NOTE — Progress Notes (Signed)
03/29/2014 Brenda Cobb   09-08-1945  [id number]  Primary Physician Talmage Coin, MD Primary Cardiologist: Lorretta Harp MD Renae Gloss   HPI:  The patient returns today for followup. She is a delightful, 69 year old, thin and fit-appearing, married Caucasian female with no children who I last saw in the office 12 months ago. She saw Kerin Ransom [facility] in the office 6 months ago.She has a history of bileaflet severe mitral valve prolapse with regurgitation which she is totally asymptomatic from. She also has mild hyperlipidemia. Her recent 2D echo performed January 07, 2012, revealed normal LV size and function with mild concentric LVH, mildly dilated left atrium of 42 mm. Her echo really has not changed significantly since it was done prior to that.since I saw her a year ago she is completely asymptomatic. She did see Kerin Ransom PAC back 6 months ago. Her most recent 2-D echo is completely without change.   Current Outpatient Prescriptions  Medication Sig Dispense Refill  . amoxicillin (AMOXIL) 500 MG tablet Take 1 tablet (500 mg total) by mouth 2 (two) times daily. (Patient taking differently: Take 500 mg by mouth as needed. ) 24 tablet 2  . aspirin 325 MG tablet Take 325 mg by mouth daily.    . Calcium Carbonate-Vitamin D (CALCIUM + D PO) Take by mouth.      . irbesartan (AVAPRO) 150 MG tablet TAKE 1 TABLET BY MOUTH EVERY DAY 30 tablet 7  . levothyroxine (SYNTHROID, LEVOTHROID) 100 MCG tablet Take 100 mcg by mouth daily.      . Multiple Vitamin (MULTIVITAMIN) tablet Take 1 tablet by mouth daily.      Marland Kitchen zolpidem (AMBIEN) 10 MG tablet Take 1 tablet (10 mg total) by mouth at bedtime as needed. 30 tablet 4   No current facility-administered medications for this visit.    No Known Allergies  History   Social History  . Marital Status: Married    Spouse Name: N/A    Number of Children: N/A  . Years of Education: N/A   Occupational History  . Not on file.    Social History Main Topics  . Smoking status: Never Smoker   . Smokeless tobacco: Never Used  . Alcohol Use: 2.0 oz/week    4 Not specified per week  . Drug Use: No  . Sexual Activity: No   Other Topics Concern  . Not on file   Social History Narrative     Review of Systems: General: negative for chills, fever, night sweats or weight changes.  Cardiovascular: negative for chest pain, dyspnea on exertion, edema, orthopnea, palpitations, paroxysmal nocturnal dyspnea or shortness of breath Dermatological: negative for rash Respiratory: negative for cough or wheezing Urologic: negative for hematuria Abdominal: negative for nausea, vomiting, diarrhea, bright red blood per rectum, melena, or hematemesis Neurologic: negative for visual changes, syncope, or dizziness All other systems reviewed and are otherwise negative except as noted above.    Blood pressure 100/58, pulse 63, height 5\' 4"  (1.626 m), weight 101 lb (45.813 kg).  General appearance: alert and no distress Neck: no adenopathy, no carotid bruit, no JVD, supple, symmetrical, trachea midline and thyroid not enlarged, symmetric, no tenderness/mass/nodules Lungs: clear to auscultation bilaterally Heart: April goals systolic click and murmur consistent with mitral valve prolapse Extremities: extremities normal, atraumatic, no cyanosis or edema  EKG normal sinus rhythm at 63 with nonspecific ST and T-wave changes. I personally reviewed this EKG  ASSESSMENT AND PLAN:   MVP (mitral valve  prolapse) History of severe bileaflet mitral valve prolapse which we have been following for years by 2-D echocardiography. She is completely asymptomatic. Her most recent echo performed a week ago showed normal LV size and function, severe bileaflet MR with moderate left atrial enlargement. She remains in sinus rhythm. We'll continue to follow her by 2-D echocardiography.       Lorretta Harp MD FACP,FACC,FAHA,  [facility] 03/29/2014 11:23 AM

## 2014-03-29 NOTE — Patient Instructions (Signed)
  We will see you back in follow up in 6 months with Lurena Joiner and 1 year with Dr Gwenlyn Found.   Dr Gwenlyn Found has ordered:  Echocardiogram (echo every 6 months). Echocardiography is a painless test that uses sound waves to create images of your heart. It provides your doctor with information about the size and shape of your heart and how well your heart's chambers and valves are working. This procedure takes approximately one hour. There are no restrictions for this procedure.

## 2014-03-29 NOTE — Assessment & Plan Note (Signed)
History of severe bileaflet mitral valve prolapse which we have been following for years by 2-D echocardiography. She is completely asymptomatic. Her most recent echo performed a week ago showed normal LV size and function, severe bileaflet MR with moderate left atrial enlargement. She remains in sinus rhythm. We'll continue to follow her by 2-D echocardiography.

## 2014-04-02 ENCOUNTER — Other Ambulatory Visit: Payer: Self-pay | Admitting: Cardiovascular Disease

## 2014-04-15 DIAGNOSIS — M8580 Other specified disorders of bone density and structure, unspecified site: Secondary | ICD-10-CM

## 2014-04-15 HISTORY — DX: Other specified disorders of bone density and structure, unspecified site: M85.80

## 2014-04-18 ENCOUNTER — Encounter: Payer: Self-pay | Admitting: Gynecology

## 2014-04-18 ENCOUNTER — Ambulatory Visit (INDEPENDENT_AMBULATORY_CARE_PROVIDER_SITE_OTHER): Payer: Medicare Other

## 2014-04-18 ENCOUNTER — Other Ambulatory Visit: Payer: Self-pay | Admitting: Gynecology

## 2014-04-18 DIAGNOSIS — Z13820 Encounter for screening for osteoporosis: Secondary | ICD-10-CM

## 2014-04-18 DIAGNOSIS — M8580 Other specified disorders of bone density and structure, unspecified site: Secondary | ICD-10-CM

## 2014-04-22 ENCOUNTER — Other Ambulatory Visit: Payer: Self-pay | Admitting: Gynecology

## 2014-04-22 DIAGNOSIS — M8580 Other specified disorders of bone density and structure, unspecified site: Secondary | ICD-10-CM

## 2014-08-05 ENCOUNTER — Telehealth (HOSPITAL_COMMUNITY): Payer: Self-pay | Admitting: *Deleted

## 2014-08-05 DIAGNOSIS — I341 Nonrheumatic mitral (valve) prolapse: Secondary | ICD-10-CM

## 2014-08-05 NOTE — Telephone Encounter (Signed)
Order placed

## 2014-08-05 NOTE — Telephone Encounter (Signed)
Pt called to schedule her 6 month echo appt.  There isn't an order or recall for it in epic.  Ive already scheduled the appt per pt but I do need an order to be placed.  Thanks E

## 2014-09-23 ENCOUNTER — Ambulatory Visit (HOSPITAL_COMMUNITY)
Admission: RE | Admit: 2014-09-23 | Discharge: 2014-09-23 | Disposition: A | Discharge status: Home / Self Care | Payer: Medicare Other | Source: Ambulatory Visit | Attending: Cardiovascular Disease | Admitting: Cardiovascular Disease

## 2014-09-23 DIAGNOSIS — I071 Rheumatic tricuspid insufficiency: Secondary | ICD-10-CM | POA: Diagnosis not present

## 2014-09-23 DIAGNOSIS — I340 Nonrheumatic mitral (valve) insufficiency: Secondary | ICD-10-CM | POA: Diagnosis not present

## 2014-09-23 DIAGNOSIS — I517 Cardiomegaly: Secondary | ICD-10-CM | POA: Insufficient documentation

## 2014-09-23 DIAGNOSIS — I341 Nonrheumatic mitral (valve) prolapse: Secondary | ICD-10-CM | POA: Diagnosis not present

## 2014-09-24 ENCOUNTER — Encounter: Payer: Self-pay | Admitting: *Deleted

## 2014-09-27 ENCOUNTER — Other Ambulatory Visit: Payer: Self-pay | Admitting: Cardiovascular Disease

## 2014-10-08 ENCOUNTER — Encounter: Payer: Self-pay | Admitting: Cardiology

## 2014-10-08 ENCOUNTER — Ambulatory Visit (INDEPENDENT_AMBULATORY_CARE_PROVIDER_SITE_OTHER): Payer: Medicare Other | Admitting: Cardiology

## 2014-10-08 VITALS — BP 100/72 | HR 62 | Ht 64.0 in | Wt 102.1 lb

## 2014-10-08 DIAGNOSIS — E079 Disorder of thyroid, unspecified: Secondary | ICD-10-CM | POA: Diagnosis not present

## 2014-10-08 DIAGNOSIS — I340 Nonrheumatic mitral (valve) insufficiency: Secondary | ICD-10-CM

## 2014-10-08 DIAGNOSIS — I341 Nonrheumatic mitral (valve) prolapse: Secondary | ICD-10-CM

## 2014-10-08 NOTE — Assessment & Plan Note (Signed)
Mitral regurgitation/mitral valve prolapse- severe now by echo, still asymptomatic

## 2014-10-08 NOTE — Patient Instructions (Signed)
Your physician wants you to follow-up in: 6 Months with Dr Gwenlyn Found. You will receive a reminder letter in the mail two months in advance. If you don't receive a letter, please call our office to schedule the follow-up appointment.  Your physician has requested that you have an echocardiogram. Echocardiography is a painless test that uses sound waves to create images of your heart. It provides your doctor with information about the size and shape of your heart and how well your heart's chambers and valves are working. This procedure takes approximately one hour. There are no restrictions for this procedure. 6 Months

## 2014-10-08 NOTE — Assessment & Plan Note (Signed)
Hypothyroid, followed by Dr Bubba Camp

## 2014-10-08 NOTE — Progress Notes (Addendum)
    10/08/2014 Brenda Cobb   [birth year]  [id number]  Primary Physician Talmage Coin, MD Primary Cardiologist: Dr Gwenlyn Found  HPI:  69 year old, thin female with a history of bileaflet mitral valve prolapse with regurgitation which she is asymptomatic from. She gets an echo every 6 months. Her MR has gone from moderate to severe on her last two echos but she remains asymptomatic and in NSR. Her LVF remains normal.     Current Outpatient Prescriptions  Medication Sig Dispense Refill  . amoxicillin (AMOXIL) 500 MG capsule Take 2,000 mg by mouth as needed. One hours prior to dental work    . aspirin 325 MG tablet Take 325 mg by mouth daily.    . Calcium Carbonate-Vitamin D (CALCIUM + D PO) Take by mouth.      . irbesartan (AVAPRO) 150 MG tablet TAKE 1 TABLET BY MOUTH EVERY DAY 30 tablet 1  . levothyroxine (SYNTHROID, LEVOTHROID) 100 MCG tablet Take 100 mcg by mouth daily.      . Multiple Vitamin (MULTIVITAMIN) tablet Take 1 tablet by mouth daily.      Marland Kitchen zolpidem (AMBIEN) 10 MG tablet Take 1 tablet (10 mg total) by mouth at bedtime as needed. 30 tablet 4   No current facility-administered medications for this visit.    No Known Allergies  History   Social History  . Marital Status: Married    Spouse Name: N/A  . Number of Children: N/A  . Years of Education: N/A   Occupational History  . Not on file.   Social History Main Topics  . Smoking status: Never Smoker   . Smokeless tobacco: Never Used  . Alcohol Use: 2.0 oz/week    4 Standard drinks or equivalent per week  . Drug Use: No  . Sexual Activity: No   Other Topics Concern  . Not on file   Social History Narrative     Review of Systems: General: negative for chills, fever, night sweats or weight changes.  Cardiovascular: negative for chest pain, dyspnea on exertion, edema, orthopnea, palpitations, paroxysmal nocturnal dyspnea or shortness of breath Dermatological: negative for rash Respiratory: negative for cough  or wheezing Urologic: negative for hematuria Abdominal: negative for nausea, vomiting, diarrhea, bright red blood per rectum, melena, or hematemesis Neurologic: negative for visual changes, syncope, or dizziness All other systems reviewed and are otherwise negative except as noted above.    Blood pressure 100/72, pulse 62, height 5\' 4"  (1.626 m), weight 102 lb 1.6 oz (46.312 kg).  General appearance: alert, cooperative, no distress and thin Neck: no carotid bruit and no JVD Lungs: clear to auscultation bilaterally Heart: regular rate and rhythm and 1/6 mid systolic murmur at MV area Extremities: no edema Skin: Skin color, texture, turgor normal. 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 She had labs drawn at her PCP office in [place] in May, we will try and obtain copies. Repeat echo in 6 months. She knows to contact us if she develops DOE, orthopnea, palpitations, or edema.   Erlene Quan PA-C 10/08/2014 10:02 AM   [name] had labs in jan 2016- Renal, LFTs, and CBC all WNL. LDL was 139.

## 2014-10-09 ENCOUNTER — Encounter: Payer: Self-pay | Admitting: Cardiovascular Disease

## 2014-11-28 ENCOUNTER — Other Ambulatory Visit: Payer: Self-pay | Admitting: Cardiovascular Disease

## 2015-01-03 ENCOUNTER — Other Ambulatory Visit: Payer: Self-pay | Admitting: Cardiovascular Disease

## 2015-02-17 ENCOUNTER — Ambulatory Visit (INDEPENDENT_AMBULATORY_CARE_PROVIDER_SITE_OTHER): Payer: Medicare Other | Admitting: Gynecology

## 2015-02-17 ENCOUNTER — Other Ambulatory Visit (HOSPITAL_COMMUNITY)
Admission: RE | Admit: 2015-02-17 | Discharge: 2015-02-17 | Disposition: A | Discharge status: Home / Self Care | Payer: Medicare Other | Source: Ambulatory Visit | Attending: Gynecology | Admitting: Gynecology

## 2015-02-17 ENCOUNTER — Encounter: Payer: Self-pay | Admitting: Gynecology

## 2015-02-17 VITALS — BP 118/76 | Ht 64.0 in | Wt 104.0 lb

## 2015-02-17 DIAGNOSIS — N952 Postmenopausal atrophic vaginitis: Secondary | ICD-10-CM | POA: Diagnosis not present

## 2015-02-17 DIAGNOSIS — I341 Nonrheumatic mitral (valve) prolapse: Secondary | ICD-10-CM | POA: Diagnosis not present

## 2015-02-17 DIAGNOSIS — Z124 Encounter for screening for malignant neoplasm of cervix: Secondary | ICD-10-CM

## 2015-02-17 DIAGNOSIS — Z01419 Encounter for gynecological examination (general) (routine) without abnormal findings: Secondary | ICD-10-CM

## 2015-02-17 DIAGNOSIS — M8580 Other specified disorders of bone density and structure, unspecified site: Secondary | ICD-10-CM | POA: Diagnosis not present

## 2015-02-17 NOTE — Patient Instructions (Signed)

## 2015-02-17 NOTE — Addendum Note (Signed)
Addended by: Nelva Nay on: 02/17/2015 10:48 AM   Modules accepted: Orders

## 2015-02-17 NOTE — Progress Notes (Signed)
Brenda Cobb 1945/06/24 [id number]        69 y.o.  G0P0  No LMP recorded. Patient is postmenopausal. for breast and pelvic exam.  Past medical history,surgical history, problem list, medications, allergies, family history and social history were all reviewed and documented as reviewed in the EPIC chart.  ROS:  Performed with pertinent positives and negatives included in the history, assessment and plan.   Additional significant findings :  none   Exam: Kim Counsellor Vitals:   02/17/15 1013  BP: 118/76  Height: 5\' 4"  (1.626 m)  Weight: 104 lb (47.174 kg)   General appearance:  Normal affect, orientation and appearance. Skin: Grossly normal HEENT: Without gross lesions.  No cervical or supraclavicular adenopathy. Thyroid normal.  Lungs:  Clear without wheezing, rales or rhonchi Cardiac: RR, with 2/6 systolic murmur consistent with history of MVP Abdominal:  Soft, nontender, without masses, guarding, rebound, organomegaly or hernia Breasts:  Examined lying and sitting without masses, retractions, discharge or axillary adenopathy. Pelvic:  Ext/BUS/vagina with atrophic changes  Cervix with atrophic changes. Pap smear done  Uterus anteverted, normal size, shape and contour, midline and mobile nontender   Adnexa  Without masses or tenderness    Anus and perineum  Normal   Rectovaginal  Normal sphincter tone without palpated masses or tenderness.    Assessment/Plan:  69 y.o. G0P0 female for breast and pelvic exam.   1. Postmenopausal/atrophic genital changes. Without significant hot flushes, night sweats, vaginal dryness or any vaginal bleeding. Continue monitoring report any vaginal bleeding. 2. Osteopenia. DEXA 2016 T score -1.1 FRAX 6.9%/0.7%. Repeat a 2 year interval. Increased calcium vitamin D reviewed. 3. Mammography scheduled patient will follow up for this. SBE monthly reviewed. 4. Pap smear 2013. Pap smear done today. No history of significant abnormal Pap smears.  Options to stop screening altogether reviewed based on age greater than 31. Patient uncomfortable with this. 5. Colonoscopy due now on patients going to call and schedule. 6. Occasional insomnia. Uses Ambien as needed. Does not require prescription at this time. 7. History of MVP. Actively followed by cardiology every 6 months. 8. Health maintenance. No routine lab work done as this is done at her primary physician's office. Follow up 1 year, sooner as needed.   [name] MD, 10:29 AM 02/17/2015

## 2015-02-18 LAB — CYTOLOGY - PAP

## 2015-03-27 ENCOUNTER — Encounter: Payer: Self-pay | Admitting: Gynecology

## 2015-04-04 ENCOUNTER — Ambulatory Visit: Payer: BLUE CROSS/BLUE SHIELD | Admitting: Cardiovascular Disease

## 2015-04-10 ENCOUNTER — Other Ambulatory Visit (HOSPITAL_COMMUNITY): Payer: BLUE CROSS/BLUE SHIELD

## 2015-04-24 ENCOUNTER — Ambulatory Visit (HOSPITAL_COMMUNITY): Payer: Medicare Other | Attending: Cardiology

## 2015-04-24 ENCOUNTER — Other Ambulatory Visit: Payer: Self-pay

## 2015-04-24 DIAGNOSIS — I340 Nonrheumatic mitral (valve) insufficiency: Secondary | ICD-10-CM | POA: Diagnosis not present

## 2015-04-24 DIAGNOSIS — I358 Other nonrheumatic aortic valve disorders: Secondary | ICD-10-CM | POA: Insufficient documentation

## 2015-04-24 DIAGNOSIS — I341 Nonrheumatic mitral (valve) prolapse: Secondary | ICD-10-CM | POA: Diagnosis not present

## 2015-04-24 DIAGNOSIS — I071 Rheumatic tricuspid insufficiency: Secondary | ICD-10-CM | POA: Diagnosis not present

## 2015-04-30 ENCOUNTER — Ambulatory Visit (INDEPENDENT_AMBULATORY_CARE_PROVIDER_SITE_OTHER): Payer: Medicare Other | Admitting: Cardiovascular Disease

## 2015-04-30 ENCOUNTER — Encounter: Payer: Self-pay | Admitting: Cardiovascular Disease

## 2015-04-30 VITALS — BP 122/72 | HR 80 | Ht 64.0 in | Wt 104.0 lb

## 2015-04-30 DIAGNOSIS — I340 Nonrheumatic mitral (valve) insufficiency: Secondary | ICD-10-CM | POA: Diagnosis not present

## 2015-04-30 DIAGNOSIS — I341 Nonrheumatic mitral (valve) prolapse: Secondary | ICD-10-CM

## 2015-04-30 NOTE — Progress Notes (Signed)
04/30/2015 Brenda Cobb   Feb 05, 1946  [id number]  Primary Physician Talmage Coin, MD Primary Cardiologist: Lorretta Harp MD Renae Gloss   HPI:  The patient returns today for followup. 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 She saw Kerin Ransom [facility] in the office 6 months ago. She has a history of bileaflet severe mitral valve prolapse with regurgitation which she is totally asymptomatic from. She also has mild hyperlipidemia. Her recent 2D echo performed January 07, 2012, revealed normal LV size and function with mild concentric LVH, mildly dilated left atrium of 42 mm. Her echo really has not changed significantly since it was done prior to that.since I saw her a year ago she is completely asymptomatic. She did see Kerin Ransom PAC back 6 months ago. Her most recent 2-D echo is completely without change.   Current Outpatient Prescriptions  Medication Sig Dispense Refill  . amoxicillin (AMOXIL) 500 MG capsule Take 2,000 mg by mouth as needed. One hours prior to dental work    . aspirin 325 MG tablet Take 325 mg by mouth daily.    . Calcium Carbonate-Vitamin D (CALCIUM + D PO) Take by mouth.      . irbesartan (AVAPRO) 150 MG tablet TAKE 1 TABLET BY MOUTH EVERY DAY 90 tablet 2  . levothyroxine (SYNTHROID, LEVOTHROID) 100 MCG tablet Take 100 mcg by mouth daily.      . Multiple Vitamin (MULTIVITAMIN) tablet Take 1 tablet by mouth daily.      Marland Kitchen zolpidem (AMBIEN) 10 MG tablet Take 1 tablet (10 mg total) by mouth at bedtime as needed. 30 tablet 4   No current facility-administered medications for this visit.    No Known Allergies  Social History   Social History  . Marital Status: Married    Spouse Name: N/A  . Number of Children: N/A  . Years of Education: N/A   Occupational History  . Not on file.   Social History Main Topics  . Smoking status: Never  Smoker   . Smokeless tobacco: Never Used  . Alcohol Use: 2.4 oz/week    4 Standard drinks or equivalent per week  . Drug Use: No  . Sexual Activity: No     Comment: 1st intercourse 70 yo-Fewer than 5 partners   Other Topics Concern  . Not on file   Social History Narrative     Review of Systems: General: negative for chills, fever, night sweats or weight changes.  Cardiovascular: negative for chest pain, dyspnea on exertion, edema, orthopnea, palpitations, paroxysmal nocturnal dyspnea or shortness of breath Dermatological: negative for rash Respiratory: negative for cough or wheezing Urologic: negative for hematuria Abdominal: negative for nausea, vomiting, diarrhea, bright red blood per rectum, melena, or hematemesis Neurologic: negative for visual changes, syncope, or dizziness All other systems reviewed and are otherwise negative except as noted above.    Blood pressure 122/72, pulse 80, height 5\' 4"  (1.626 m), weight 104 lb (47.174 kg).  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 Her LV size and function has remained normal. She is completely asymptomatic. She does follow SBE prophylaxis.      Lorretta Harp MD FACP,FACC,FAHA, [facility] 04/30/2015 9:39 AM

## 2015-04-30 NOTE — Assessment & Plan Note (Signed)
Brenda Cobb has bileaflet mitral valve prolapse with severe MR and moderate left atrial dilatation which we have been following by semiannual 2-D echo. Her LV size and function has remained normal. She is completely asymptomatic. She does follow SBE prophylaxis.

## 2015-04-30 NOTE — Patient Instructions (Signed)
Medication Instructions:  Your physician recommends that you continue on your current medications as directed. Please refer to the Current Medication list given to you today.   Labwork: none  Testing/Procedures: Your physician has requested that you have an echocardiogram. Echocardiography is a painless test that uses sound waves to create images of your heart. It provides your doctor with information about the size and shape of your heart and how well your heart's chambers and valves are working. This procedure takes approximately one hour. There are no restrictions for this procedure. Pt needs ECHO BEFORE 6 month f/u; with Lurena Joiner; AND BEFORE 12 month f/u with Gwenlyn Found.    Follow-Up: We request that you follow-up in: 6 months with Kerin Ransom with an extender and in 12 months with Dr Andria Rhein will receive a reminder letter in the mail two months in advance. If you don't receive a letter, please call our office to schedule the follow-up appointment.    Any Other Special Instructions Will Be Listed Below (If Applicable).     If you need a refill on your cardiac medications before your next appointment, please call your pharmacy.

## 2015-09-29 ENCOUNTER — Other Ambulatory Visit: Payer: Self-pay | Admitting: Cardiovascular Disease

## 2015-09-29 NOTE — Telephone Encounter (Signed)
Rx(s) sent to pharmacy electronically.  

## 2015-10-23 ENCOUNTER — Ambulatory Visit (HOSPITAL_COMMUNITY): Payer: Medicare Other | Attending: Cardiovascular Disease

## 2015-10-23 ENCOUNTER — Encounter (INDEPENDENT_AMBULATORY_CARE_PROVIDER_SITE_OTHER): Payer: Self-pay

## 2015-10-23 ENCOUNTER — Other Ambulatory Visit: Payer: Self-pay

## 2015-10-23 DIAGNOSIS — E785 Hyperlipidemia, unspecified: Secondary | ICD-10-CM | POA: Insufficient documentation

## 2015-10-23 DIAGNOSIS — I517 Cardiomegaly: Secondary | ICD-10-CM | POA: Insufficient documentation

## 2015-10-23 DIAGNOSIS — I340 Nonrheumatic mitral (valve) insufficiency: Secondary | ICD-10-CM | POA: Diagnosis not present

## 2015-10-23 DIAGNOSIS — I059 Rheumatic mitral valve disease, unspecified: Secondary | ICD-10-CM | POA: Diagnosis present

## 2015-10-24 ENCOUNTER — Telehealth: Payer: Self-pay | Admitting: Cardiovascular Disease

## 2015-10-24 NOTE — Telephone Encounter (Deleted)
error 

## 2015-10-30 ENCOUNTER — Encounter: Payer: Self-pay | Admitting: Cardiology

## 2015-10-30 ENCOUNTER — Ambulatory Visit (INDEPENDENT_AMBULATORY_CARE_PROVIDER_SITE_OTHER): Payer: Medicare Other | Admitting: Cardiology

## 2015-10-30 VITALS — BP 122/82 | HR 65 | Ht 64.0 in | Wt 105.2 lb

## 2015-10-30 DIAGNOSIS — I340 Nonrheumatic mitral (valve) insufficiency: Secondary | ICD-10-CM

## 2015-10-30 DIAGNOSIS — I341 Nonrheumatic mitral (valve) prolapse: Secondary | ICD-10-CM

## 2015-10-30 MED ORDER — AMOXICILLIN 500 MG PO CAPS: 2000.0000 mg | ORAL_CAPSULE | ORAL | 0 refills | Status: DC | PRN

## 2015-10-30 NOTE — Patient Instructions (Signed)
Your physician wants you to follow-up in: 6 months with Dr. Gwenlyn Found. You will receive a reminder letter in the mail two months in advance. If you don't receive a letter, please call our office to schedule the follow-up appointment.  If you need a refill on your cardiac medications before your next appointment, please call your pharmacy.

## 2015-10-30 NOTE — Progress Notes (Signed)
    10/30/2015 Brenda Cobb   [birth year]  [id number]  Primary Physician Talmage Coin, MD Primary Cardiologist: Dr Gwenlyn Found  HPI:  70 year old, thin female with a history of bileaflet mitral valve prolapse with regurgitation which she is asymptomatic from. Her MR has gone from moderate to severe but she remains asymptomatic and in NSR. Her LVF remains normal with grade 2 DD. She has moderate LAE. She denies exertional dyspnea or palpitations.    Current Outpatient Prescriptions  Medication Sig Dispense Refill  . amoxicillin (AMOXIL) 500 MG capsule Take 2,000 mg by mouth as needed. One hours prior to dental work    . aspirin 325 MG tablet Take 325 mg by mouth daily.    . Calcium Carbonate-Vitamin D (CALCIUM + D PO) Take 1 tablet by mouth daily.     . irbesartan (AVAPRO) 150 MG tablet Take 1 tablet (150 mg total) by mouth daily. 90 tablet 2  . levothyroxine (SYNTHROID, LEVOTHROID) 100 MCG tablet Take 100 mcg by mouth daily.      . Multiple Vitamin (MULTIVITAMIN) tablet Take 1 tablet by mouth daily.      Marland Kitchen zolpidem (AMBIEN) 10 MG tablet Take 0.25 mg by mouth at bedtime as needed for sleep.     No current facility-administered medications for this visit.     No Known Allergies  Social History   Social History  . Marital status: Married    Spouse name: N/A  . Number of children: N/A  . Years of education: N/A   Occupational History  . Not on file.   Social History Main Topics  . Smoking status: Never Smoker  . Smokeless tobacco: Never Used  . Alcohol use 2.4 oz/week    4 Standard drinks or equivalent per week  . Drug use: No  . Sexual activity: No     Comment: 1st intercourse 70 yo-Fewer than 5 partners   Other Topics Concern  . Not on file   Social History Narrative  . No narrative on file     Review of Systems: General: negative for chills, fever, night sweats or weight changes.  Cardiovascular: negative for chest pain, dyspnea on exertion, edema, orthopnea,  palpitations, paroxysmal nocturnal dyspnea or shortness of breath Dermatological: negative for rash Respiratory: negative for cough or wheezing Urologic: negative for hematuria Abdominal: negative for nausea, vomiting, diarrhea, bright red blood per rectum, melena, or hematemesis Neurologic: negative for visual changes, syncope, or dizziness All other systems reviewed and are otherwise negative except as noted above.    Blood pressure 122/82, pulse 65, height 5\' 4"  (1.626 m), weight 105 lb 3.2 oz (47.7 kg).  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 She remains asymptomatic.  Kerin Ransom PA-C 10/30/2015 10:32 AM

## 2015-10-30 NOTE — Assessment & Plan Note (Signed)
Mitral regurgitation/mitral valve prolapse- severe now by echo, still asymptomatic 

## 2015-10-30 NOTE — Assessment & Plan Note (Signed)
Antibiotic required for procedures

## 2015-11-01 NOTE — Progress Notes (Signed)
I've reviewed 2D MR severe with MVP which we have been following annually for years. LV size and FXN have remained nl and pt asymptomatic. Still no indication for MVR/ repair. Continue to follow non invasively  JJB

## 2015-11-03 ENCOUNTER — Telehealth: Payer: Self-pay | Admitting: Cardiovascular Disease

## 2015-11-03 NOTE — Telephone Encounter (Signed)
New Message   *STAT* If patient is at the pharmacy, call can be transferred to refill team.   1. Which medications need to be refilled? (please list name of each medication and dose if known) Amoxicillin   2. Which pharmacy/location (including street and city if local pharmacy) is medication to be sent to? [facility] [place] [place]  3. Do they need a 30 day or 90 day supply? 30 day  Pt voiced she has only 4 capsules and not 24 which is the norm.  Pt voiced it could be an error or typo because she takes 2 by mouth.  Pt voiced for someone to contact her back.  Please follow up with pt. Thanks!

## 2015-11-03 NOTE — Telephone Encounter (Signed)
Returned call. Discussed prophylactic amoxicillin dose w/ patient. Advised the 2,000mg  prescribed was appropriate, according to protocol for dental procedures. She informed me she usually gets 8 tablets with a couple of refills because she has dental procedures every 3 months. Pt informs me she is aware of appropriate dose but wanted me to send larger count of tablets so that she didn't have to return to the pharmacy so frequently.  Informed patient I would need to check w/ provider to see if OK. She voiced understanding.

## 2015-11-13 NOTE — Telephone Encounter (Signed)
OK per DPR to leave detailed msg. Left msg w instruction and to call when she needs refills for this, or if further needs.

## 2015-11-13 NOTE — Telephone Encounter (Signed)
2 refills sounds OK  Garrick Midgley PA-C 11/13/2015 3:29 PM

## 2015-11-28 NOTE — Telephone Encounter (Signed)
error 

## 2016-01-09 ENCOUNTER — Other Ambulatory Visit: Payer: Self-pay | Admitting: Cardiology

## 2016-02-18 ENCOUNTER — Ambulatory Visit (INDEPENDENT_AMBULATORY_CARE_PROVIDER_SITE_OTHER): Payer: Medicare Other | Admitting: Gynecology

## 2016-02-18 ENCOUNTER — Encounter: Payer: Self-pay | Admitting: Gynecology

## 2016-02-18 VITALS — BP 122/76 | Ht 64.0 in | Wt 104.0 lb

## 2016-02-18 DIAGNOSIS — N952 Postmenopausal atrophic vaginitis: Secondary | ICD-10-CM

## 2016-02-18 DIAGNOSIS — M8580 Other specified disorders of bone density and structure, unspecified site: Secondary | ICD-10-CM

## 2016-02-18 DIAGNOSIS — Z01411 Encounter for gynecological examination (general) (routine) with abnormal findings: Secondary | ICD-10-CM

## 2016-02-18 NOTE — Progress Notes (Signed)
    CITHLALY STURGIS [birth year] [id number]        70 y.o.  G0P0  for breast and pelvic exam  Past medical history,surgical history, problem list, medications, allergies, family history and social history were all reviewed and documented as reviewed in the EPIC chart.  ROS:  Performed with pertinent positives and negatives included in the history, assessment and plan.   Additional significant findings :  None   Exam: [name] assistant Vitals:   02/18/16 0949  BP: 122/76  Weight: 104 lb (47.2 kg)  Height: 5\' 4"  (1.626 m)   Body mass index is 17.85 kg/m.  General appearance:  Normal affect, orientation and appearance. Skin: Grossly normal HEENT: Without gross lesions.  No cervical or supraclavicular adenopathy. Thyroid normal.  Lungs:  Clear without wheezing, rales or rhonchi Cardiac: RR, with 2/6 systolic murmur consistent with MVP Abdominal:  Soft, nontender, without masses, guarding, rebound, organomegaly or hernia Breasts:  Examined lying and sitting without masses, retractions, discharge or axillary adenopathy. Pelvic:  Ext, BUS, Vagina with atrophic changes. Admits 1 finger  Cervix with atrophic  Uterus anteverted, normal size, shape and contour, midline and mobile nontender   Adnexa without masses or tenderness    Anus and perineum normal   Rectovaginal normal sphincter tone without palpated masses or tenderness.    Assessment/Plan:  70 y.o. G0P0 female for breast and pelvic.   1. Postmenopausal/atrophic genital changes. No significant hot flushes, night sweats, vaginal dryness or any bleeding. Continue to monitor report any issues or bleeding. 2. Osteopenia. DEXA 2016 T score -1.1 FRAX 6.9%/0.7%. Plan repeat DEXA next year. 3. Mammography scheduled in January. SBE monthly reviewed. 4. Pap smear 2016. No Pap smear done today. No history of significant abnormal Pap smears. Options to stop screening based on age and current recommendations reviewed. 5. Colonoscopy  overdue patients going to call [facility] and schedule this. 6. Occasional Ambien use. Patient has supply will call when needs more. 7. Health maintenance.  No routine lab work done as patient does this elsewhere. Actively followed by cardiology for her MVP. Follow up in one year, sooner as needed.   [name] MD, 10:06 AM 02/18/2016

## 2016-02-18 NOTE — Patient Instructions (Signed)
Schedule your colonoscopy with:  [facility]   Address: [address], [place], [place] [address]  [phone]  You may obtain a copy of any labs that were done today by logging onto MyChart as outlined in the instructions provided with your AVS (after visit summary). The office will not call with normal lab results but certainly if there are any significant abnormalities then we will contact you.   Health Maintenance Adopting a healthy lifestyle and getting preventive care can go a long way to promote health and wellness. Talk with your health care provider about what schedule of regular examinations is right for you. This is a good chance for you to check in with your provider about disease prevention and staying healthy. In between checkups, there are plenty of things you can do on your own. Experts have done a lot of research about which lifestyle changes and preventive measures are most likely to keep you healthy. Ask your health care provider for more information. WEIGHT AND DIET  Eat a healthy diet  Be sure to include plenty of vegetables, fruits, low-fat dairy products, and lean protein.  Do not eat a lot of foods high in solid fats, added sugars, or salt.  Get regular exercise. This is one of the most important things you can do for your health.  Most adults should exercise for at least 150 minutes each week. The exercise should increase your heart rate and make you sweat (moderate-intensity exercise).  Most adults should also do strengthening exercises at least twice a week. This is in addition to the moderate-intensity exercise.  Maintain a healthy weight  Body mass index (BMI) is a measurement that can be used to identify possible weight problems. It estimates body fat based on height and weight. Your health care provider can help determine your BMI and help you achieve or maintain a healthy weight.  For females 55 years of age and older:   A BMI below  18.5 is considered underweight.  A BMI of 18.5 to 24.9 is normal.  A BMI of 25 to 29.9 is considered overweight.  A BMI of 30 and above is considered obese.  Watch levels of cholesterol and blood lipids  You should start having your blood tested for lipids and cholesterol at 69 years of age, then have this test every 5 years.  You may need to have your cholesterol levels checked more often if:  Your lipid or cholesterol levels are high.  You are older than 69 years of age.  You are at high risk for heart disease.  CANCER SCREENING   Lung Cancer  Lung cancer screening is recommended for adults 63-36 years old who are at high risk for lung cancer because of a history of smoking.  A yearly low-dose CT scan of the lungs is recommended for people who:  Currently smoke.  Have quit within the past 15 years.  Have at least a 30-pack-year history of smoking. A pack year is smoking an average of one pack of cigarettes a day for 1 year.  Yearly screening should continue until it has been 15 years since you quit.  Yearly screening should stop if you develop a health problem that would prevent you from having lung cancer treatment.  Breast Cancer  Practice breast self-awareness. This means understanding how your breasts normally appear and feel.  It also means doing regular breast self-exams. Let your health care provider know about any changes, no matter how small.  If  you are in your 20s or 30s, you should have a clinical breast exam (CBE) by a health care provider every 1-3 years as part of a regular health exam.  If you are 5 or older, have a CBE every year. Also consider having a breast X-ray (mammogram) every year.  If you have a family history of breast cancer, talk to your health care provider about genetic screening.  If you are at high risk for breast cancer, talk to your health care provider about having an MRI and a mammogram every year.  Breast cancer gene (BRCA)  assessment is recommended for women who have family members with BRCA-related cancers. BRCA-related cancers include:  Breast.  Ovarian.  Tubal.  Peritoneal cancers.  Results of the assessment will determine the need for genetic counseling and BRCA1 and BRCA2 testing. Cervical Cancer Routine pelvic examinations to screen for cervical cancer are no longer recommended for nonpregnant women who are considered low risk for cancer of the pelvic organs (ovaries, uterus, and vagina) and who do not have symptoms. A pelvic examination may be necessary if you have symptoms including those associated with pelvic infections. Ask your health care provider if a screening pelvic exam is right for you.   The Pap test is the screening test for cervical cancer for women who are considered at risk.  If you had a hysterectomy for a problem that was not cancer or a condition that could lead to cancer, then you no longer need Pap tests.  If you are older than 65 years, and you have had normal Pap tests for the past 10 years, you no longer need to have Pap tests.  If you have had past treatment for cervical cancer or a condition that could lead to cancer, you need Pap tests and screening for cancer for at least 20 years after your treatment.  If you no longer get a Pap test, assess your risk factors if they change (such as having a new sexual partner). This can affect whether you should start being screened again.  Some women have medical problems that increase their chance of getting cervical cancer. If this is the case for you, your health care provider may recommend more frequent screening and Pap tests.  The human papillomavirus (HPV) test is another test that may be used for cervical cancer screening. The HPV test looks for the virus that can cause cell changes in the cervix. The cells collected during the Pap test can be tested for HPV.  The HPV test can be used to screen women 72 years of age and older.  Getting tested for HPV can extend the interval between normal Pap tests from three to five years.  An HPV test also should be used to screen women of any age who have unclear Pap test results.  After 69 years of age, women should have HPV testing as often as Pap tests.  Colorectal Cancer  This type of cancer can be detected and often prevented.  Routine colorectal cancer screening usually begins at 69 years of age and continues through 69 years of age.  Your health care provider may recommend screening at an earlier age if you have risk factors for colon cancer.  Your health care provider may also recommend using home test kits to check for hidden blood in the stool.  A small camera at the end of a tube can be used to examine your colon directly (sigmoidoscopy or colonoscopy). This is done to check for the  earliest forms of colorectal cancer.  Routine screening usually begins at age 8.  Direct examination of the colon should be repeated every 5-10 years through 69 years of age. However, you may need to be screened more often if early forms of precancerous polyps or small growths are found. Skin Cancer  Check your skin from head to toe regularly.  Tell your health care provider about any new moles or changes in moles, especially if there is a change in a mole's shape or color.  Also tell your health care provider if you have a mole that is larger than the size of a pencil eraser.  Always use sunscreen. Apply sunscreen liberally and repeatedly throughout the day.  Protect yourself by wearing long sleeves, pants, a wide-brimmed hat, and sunglasses whenever you are outside. HEART DISEASE, DIABETES, AND HIGH BLOOD PRESSURE   Have your blood pressure checked at least every 1-2 years. High blood pressure causes heart disease and increases the risk of stroke.  If you are between 49 years and 42 years old, ask your health care provider if you should take aspirin to prevent  strokes.  Have regular diabetes screenings. This involves taking a blood sample to check your fasting blood sugar level.  If you are at a normal weight and have a low risk for diabetes, have this test once every three years after 69 years of age.  If you are overweight and have a high risk for diabetes, consider being tested at a younger age or more often. PREVENTING INFECTION  Hepatitis B  If you have a higher risk for hepatitis B, you should be screened for this virus. You are considered at high risk for hepatitis B if:  You were born in a country where hepatitis B is common. Ask your health care provider which countries are considered high risk.  Your parents were born in a high-risk country, and you have not been immunized against hepatitis B (hepatitis B vaccine).  You have HIV or AIDS.  You use needles to inject street drugs.  You live with someone who has hepatitis B.  You have had sex with someone who has hepatitis B.  You get hemodialysis treatment.  You take certain medicines for conditions, including cancer, organ transplantation, and autoimmune conditions. Hepatitis C  Blood testing is recommended for:  Everyone born from [birth year] through 1964.  Anyone with known risk factors for hepatitis C. Sexually transmitted infections (STIs)  You should be screened for sexually transmitted infections (STIs) including gonorrhea and chlamydia if:  You are sexually active and are younger than 69 years of age.  You are older than 69 years of age and your health care provider tells you that you are at risk for this type of infection.  Your sexual activity has changed since you were last screened and you are at an increased risk for chlamydia or gonorrhea. Ask your health care provider if you are at risk.  If you do not have HIV, but are at risk, it may be recommended that you take a prescription medicine daily to prevent HIV infection. This is called pre-exposure prophylaxis  (PrEP). You are considered at risk if:  You are sexually active and do not regularly use condoms or know the HIV status of your partner(s).  You take drugs by injection.  You are sexually active with a partner who has HIV. Talk with your health care provider about whether you are at high risk of being infected with HIV. If you choose  to begin PrEP, you should first be tested for HIV. You should then be tested every 3 months for as long as you are taking PrEP.  PREGNANCY   If you are premenopausal and you may become pregnant, ask your health care provider about preconception counseling.  If you may become pregnant, take 400 to 800 micrograms (mcg) of folic acid every day.  If you want to prevent pregnancy, talk to your health care provider about birth control (contraception). OSTEOPOROSIS AND MENOPAUSE   Osteoporosis is a disease in which the bones lose minerals and strength with aging. This can result in serious bone fractures. Your risk for osteoporosis can be identified using a bone density scan.  If you are 85 years of age or older, or if you are at risk for osteoporosis and fractures, ask your health care provider if you should be screened.  Ask your health care provider whether you should take a calcium or vitamin D supplement to lower your risk for osteoporosis.  Menopause may have certain physical symptoms and risks.  Hormone replacement therapy may reduce some of these symptoms and risks. Talk to your health care provider about whether hormone replacement therapy is right for you.  HOME CARE INSTRUCTIONS   Schedule regular health, dental, and eye exams.  Stay current with your immunizations.   Do not use any tobacco products including cigarettes, chewing tobacco, or electronic cigarettes.  If you are pregnant, do not drink alcohol.  If you are breastfeeding, limit how much and how often you drink alcohol.  Limit alcohol intake to no more than 1 drink per day for  nonpregnant women. One drink equals 12 ounces of beer, 5 ounces of wine, or 1 ounces of hard liquor.  Do not use street drugs.  Do not share needles.  Ask your health care provider for help if you need support or information about quitting drugs.  Tell your health care provider if you often feel depressed.  Tell your health care provider if you have ever been abused or do not feel safe at home. Document Released: 09/14/2010 Document Revised: 07/16/2013 Document Reviewed: 01/31/2013 [facility] Patient Information 2015 [place], [place]. This information is not intended to replace advice given to you by your health care provider. Make sure you discuss any questions you have with your health care provider.

## 2016-03-10 ENCOUNTER — Telehealth: Payer: Self-pay | Admitting: Cardiovascular Disease

## 2016-03-10 DIAGNOSIS — I341 Nonrheumatic mitral (valve) prolapse: Secondary | ICD-10-CM

## 2016-03-10 DIAGNOSIS — I340 Nonrheumatic mitral (valve) insufficiency: Secondary | ICD-10-CM

## 2016-03-10 NOTE — Telephone Encounter (Signed)
Pt was calling to sched her annual Echo but there wasn't an order in the system. Pt stated she would call back tomorrow to see if order has been placed to sched.

## 2016-03-10 NOTE — Telephone Encounter (Signed)
Notes Recorded by Lorretta Harp, MD on 10/24/2015 at 1:44 PM EDT Nl LV size and Fxn with MVP and severe MR unchanged from prior study. Repeat 12 months  ---------------  Called pt, no answer, goes to VM - Ok to leave detailed msg per DPR. Left msg explaining last results were in august and indicated 1 year repeat - she does not need echo at this time but if she has further questions or needs, to call.

## 2016-03-10 NOTE — Telephone Encounter (Signed)
Patient called back - clarifies that for several years she's been having her echo done every 6 months, would be done in February - wants to see if this can be ordered in advance of her return visit w Dr. Gwenlyn Found in February. Asks that she be able to discuss w Dr. Gwenlyn Found if the frequency of rechecks is being changed.  Will route for OK on echo order, further recommendations.  Affirmed that she has a recall order for return Feb appt.

## 2016-03-10 NOTE — Telephone Encounter (Signed)
Following 2D annually. Can perform prior to her next OV

## 2016-03-11 NOTE — Telephone Encounter (Signed)
Left msg for patient. There is an echo already in system and recall is set up for her visit in Feb. This can be scheduled prior to visit. Have advised her to call if she has questions.

## 2016-03-15 HISTORY — PX: BREAST BIOPSY: SHX20

## 2016-04-22 ENCOUNTER — Other Ambulatory Visit: Payer: Self-pay | Admitting: Cardiology

## 2016-04-22 NOTE — Telephone Encounter (Signed)
Rx(s) sent to pharmacy electronically.  

## 2016-04-29 ENCOUNTER — Other Ambulatory Visit: Payer: Self-pay | Admitting: Cardiovascular Disease

## 2016-04-29 ENCOUNTER — Other Ambulatory Visit: Payer: Self-pay

## 2016-04-29 ENCOUNTER — Ambulatory Visit (HOSPITAL_COMMUNITY): Payer: Medicare Other | Attending: Cardiovascular Disease

## 2016-04-29 DIAGNOSIS — I501 Left ventricular failure: Secondary | ICD-10-CM | POA: Insufficient documentation

## 2016-04-29 DIAGNOSIS — I340 Nonrheumatic mitral (valve) insufficiency: Secondary | ICD-10-CM | POA: Diagnosis not present

## 2016-05-03 ENCOUNTER — Encounter: Payer: Self-pay | Admitting: Gynecology

## 2016-05-05 ENCOUNTER — Encounter: Payer: Self-pay | Admitting: Gynecology

## 2016-05-05 ENCOUNTER — Encounter: Payer: Self-pay | Admitting: Cardiovascular Disease

## 2016-05-05 ENCOUNTER — Ambulatory Visit (INDEPENDENT_AMBULATORY_CARE_PROVIDER_SITE_OTHER): Payer: Medicare Other | Admitting: Cardiovascular Disease

## 2016-05-05 ENCOUNTER — Other Ambulatory Visit: Payer: Self-pay | Admitting: Radiology

## 2016-05-05 VITALS — BP 124/70 | HR 83 | Ht 64.0 in | Wt 102.4 lb

## 2016-05-05 DIAGNOSIS — I340 Nonrheumatic mitral (valve) insufficiency: Secondary | ICD-10-CM

## 2016-05-05 DIAGNOSIS — I341 Nonrheumatic mitral (valve) prolapse: Secondary | ICD-10-CM | POA: Diagnosis not present

## 2016-05-05 MED ORDER — IRBESARTAN 150 MG PO TABS: 150.0000 mg | ORAL_TABLET | Freq: Every day | ORAL | 2 refills | Status: DC

## 2016-05-05 MED ORDER — AMOXICILLIN 500 MG PO CAPS: ORAL_CAPSULE | ORAL | 0 refills | Status: DC

## 2016-05-05 NOTE — Patient Instructions (Signed)
Medication Instructions: Your physician recommends that you continue on your current medications as directed. Please refer to the Current Medication list given to you today.  Testing/Procedures: Your physician has requested that you have an echocardiogram in 1 year--prior to appt with Dr. Berry. Echocardiography is a painless test that uses sound waves to create images of your heart. It provides your doctor with information about the size and shape of your heart and how well your heart's chambers and valves are working. This procedure takes approximately one hour. There are no restrictions for this procedure.  Follow-Up: Your physician wants you to follow-up in: 1 year with Dr. Berry. You will receive a reminder letter in the mail two months in advance. If you don't receive a letter, please call our office to schedule the follow-up appointment.  If you need a refill on your cardiac medications before your next appointment, please call your pharmacy.  

## 2016-05-05 NOTE — Progress Notes (Signed)
05/05/2016 Brenda Cobb   Apr 09, 1945  [id number]  Primary Physician Talmage Coin, MD Primary Cardiologist: Lorretta Harp MD Renae Gloss  HPI:  The patient returns today for followup. She is a delightful, the 71 year old, thin and fit-appearing, married Caucasian female with no children who I last saw in the office 04/30/15.Marland Kitchen She has a history of bileaflet severe mitral valve prolapse with regurgitation which she is totally asymptomatic from. She also has mild hyperlipidemia. Her recent 2D echo performed 04/29/16 revealed normal LV size and function with mild LVH, moderate mitral regurgitation and normal left atrial size. She does have a history of bileaflet prolapse. She does take antibiotics for SBE prophylaxis.  Current Outpatient Prescriptions  Medication Sig Dispense Refill  . amoxicillin (AMOXIL) 500 MG capsule TAKE 4 CAPSULES BY MOUTH 1 HOUR PRIOR TO DENTAL APPOINTMENT 4 capsule 0  . aspirin 325 MG tablet Take 325 mg by mouth daily.    . Calcium Carbonate-Vitamin D (CALCIUM + D PO) Take 1 tablet by mouth daily.     . irbesartan (AVAPRO) 150 MG tablet Take 1 tablet (150 mg total) by mouth daily. 90 tablet 2  . levothyroxine (SYNTHROID, LEVOTHROID) 100 MCG tablet Take 100 mcg by mouth daily.      . Multiple Vitamin (MULTIVITAMIN) tablet Take 1 tablet by mouth daily.      Marland Kitchen zolpidem (AMBIEN) 10 MG tablet Take 0.25 mg by mouth at bedtime as needed for sleep.     No current facility-administered medications for this visit.     No Known Allergies  Social History   Social History  . Marital status: Married    Spouse name: N/A  . Number of children: N/A  . Years of education: N/A   Occupational History  . Not on file.   Social History Main Topics  . Smoking status: Never Smoker  . Smokeless tobacco: Never Used  . Alcohol use 2.4 oz/week    4 Standard drinks or equivalent per week  . Drug use: No  . Sexual activity: No     Comment: 1st intercourse 71  yo-Fewer than 5 partners   Other Topics Concern  . Not on file   Social History Narrative  . No narrative on file     Review of Systems: General: negative for chills, fever, night sweats or weight changes.  Cardiovascular: negative for chest pain, dyspnea on exertion, edema, orthopnea, palpitations, paroxysmal nocturnal dyspnea or shortness of breath Dermatological: negative for rash Respiratory: negative for cough or wheezing Urologic: negative for hematuria Abdominal: negative for nausea, vomiting, diarrhea, bright red blood per rectum, melena, or hematemesis Neurologic: negative for visual changes, syncope, or dizziness All other systems reviewed and are otherwise negative except as noted above.    Blood pressure 124/70, pulse 83, height 5\' 4"  (1.626 m), weight 102 lb 6.4 oz (46.4 kg), SpO2 100 %.  General appearance: alert and no distress Neck: no adenopathy, no carotid bruit, no JVD, supple, symmetrical, trachea midline and thyroid not enlarged, symmetric, no tenderness/mass/nodules Lungs: clear to auscultation bilaterally Heart: 2/6 systolic ejection murmur at the apex consistent with mitral regurgitation. Extremities: 2/6 holosystolic apical murmur consistent with mitral regurgitation  EKG normal sinus rhythm at 70 with evidence of LVH with repolarization changes. I personally reviewed this EKG  ASSESSMENT AND PLAN:   MVP (mitral valve prolapse) History of mitral prolapse with moderate mitral regurgitation with asymptomatic. Recent 2-D echo showed moderate MR, normal LV size and function with mild  concentric LVH performed 04/29/16. She does take antibiotics for SBE prophylaxis.      Lorretta Harp MD FACP,FACC,FAHA, [facility] 05/05/2016 11:27 AM

## 2016-05-05 NOTE — Assessment & Plan Note (Signed)
History of mitral prolapse with moderate mitral regurgitation with asymptomatic. Recent 2-D echo showed moderate MR, normal LV size and function with mild concentric LVH performed 04/29/16. She does take antibiotics for SBE prophylaxis.

## 2016-05-06 ENCOUNTER — Encounter: Payer: Self-pay | Admitting: Gynecology

## 2016-05-06 NOTE — Addendum Note (Signed)
Addended by: Zebedee Iba on: 05/06/2016 09:59 AM   Modules accepted: Orders

## 2016-07-28 ENCOUNTER — Other Ambulatory Visit: Payer: Self-pay | Admitting: Cardiovascular Disease

## 2016-08-05 ENCOUNTER — Telehealth: Payer: Self-pay | Admitting: Cardiovascular Disease

## 2016-08-05 NOTE — Telephone Encounter (Signed)
Please call,she needs to talk you about her medicine.Brenda Cobb

## 2016-08-05 NOTE — Telephone Encounter (Signed)
Spoke with pt she states that she foes and has dental procedures every month or every-other-month and states that she has been having a hard time getting refills for prophylaxis for these dental procedures. Is it ok to send amox 500mg  #4with 6 refills since she goes so frequently? Please advise

## 2016-08-06 ENCOUNTER — Other Ambulatory Visit: Payer: Self-pay | Admitting: Cardiovascular Disease

## 2016-08-06 NOTE — Telephone Encounter (Signed)
Rx has been sent to the pharmacy electronically. ° °

## 2016-08-09 NOTE — Telephone Encounter (Signed)
Fine with me

## 2016-08-10 NOTE — Telephone Encounter (Signed)
Rx sent 08-06-16

## 2017-02-24 ENCOUNTER — Ambulatory Visit (INDEPENDENT_AMBULATORY_CARE_PROVIDER_SITE_OTHER): Payer: Medicare Other | Admitting: Gynecology

## 2017-02-24 ENCOUNTER — Encounter: Payer: Self-pay | Admitting: Gynecology

## 2017-02-24 VITALS — BP 120/72 | Ht 64.0 in | Wt 104.0 lb

## 2017-02-24 DIAGNOSIS — M8580 Other specified disorders of bone density and structure, unspecified site: Secondary | ICD-10-CM

## 2017-02-24 DIAGNOSIS — Z01411 Encounter for gynecological examination (general) (routine) with abnormal findings: Secondary | ICD-10-CM

## 2017-02-24 DIAGNOSIS — N952 Postmenopausal atrophic vaginitis: Secondary | ICD-10-CM

## 2017-02-24 NOTE — Progress Notes (Signed)
    Brenda Cobb 09-Jan-1946 [id number]        71 y.o.  G0P0 for breast and pelvic exam  Past medical history,surgical history, problem list, medications, allergies, family history and social history were all reviewed and documented as reviewed in the EPIC chart.  ROS:  Performed with pertinent positives and negatives included in the history, assessment and plan.   Additional significant findings : None   Exam: [name] assistant Vitals:   02/24/17 0947  BP: 120/72  Weight: 104 lb (47.2 kg)  Height: 5\' 4"  (1.626 m)   Body mass index is 17.85 kg/m.  General appearance:  Normal affect, orientation and appearance. Skin: Grossly normal HEENT: Without gross lesions.  No cervical or supraclavicular adenopathy. Thyroid normal.  Lungs:  Clear without wheezing, rales or rhonchi Cardiac: RR, without RMG Abdominal:  Soft, nontender, without masses, guarding, rebound, organomegaly or hernia Breasts:  Examined lying and sitting without masses, retractions, discharge or axillary adenopathy. Pelvic:  Ext, BUS, Vagina: With atrophic changes  Cervix: With atrophic changes  Uterus: Anteverted, normal size, shape and contour, midline and mobile nontender   Adnexa: Without masses or tenderness    Anus and perineum: Normal   Rectovaginal: Normal sphincter tone without palpated masses or tenderness.    Assessment/Plan:  71 y.o. G0P0 female for breast and pelvic exam.   1. Postmenopausal/atrophic genital changes.  No significant hot flushes, night sweats, vaginal dryness or any vaginal bleeding.  Continue to monitor and report any issues or bleeding. 2. Osteopenia.  DEXA 2016 T score -1.1.  FRAX 6.9% / 0.7%.  Schedule follow-up DEXA beginning of next year and she is going to arrange for this. 3. Mammography coming due in February and she is going to follow-up for this.  Breast exam normal today.  Had benign breast biopsy this past year. 4. Colonoscopy overdue and patient knows to schedule  and plans to do so. 5. Pap smear 2016.  No Pap smear done today.  No history of abnormal Pap smears.  Options to stop screening per current screening guidelines based on age reviewed.  Will readdress on annual basis. 6. Health maintenance.  No routine lab work done as patient does this elsewhere.  Follow-up 1 year, sooner as needed.   [name] MD, 10:14 AM 02/24/2017

## 2017-02-24 NOTE — Patient Instructions (Addendum)
Schedule colonoscopy  [facility]   Address: [address], [place], [place] [address]  [phone]   Follow up for bone density as scheduled

## 2017-03-19 ENCOUNTER — Other Ambulatory Visit: Payer: Self-pay | Admitting: Cardiovascular Disease

## 2017-05-04 ENCOUNTER — Ambulatory Visit (HOSPITAL_COMMUNITY): Payer: Medicare Other | Attending: Cardiology

## 2017-05-04 ENCOUNTER — Other Ambulatory Visit: Payer: Self-pay

## 2017-05-04 DIAGNOSIS — I081 Rheumatic disorders of both mitral and tricuspid valves: Secondary | ICD-10-CM | POA: Insufficient documentation

## 2017-05-04 DIAGNOSIS — I340 Nonrheumatic mitral (valve) insufficiency: Secondary | ICD-10-CM

## 2017-05-05 ENCOUNTER — Encounter: Payer: Self-pay | Admitting: Gynecology

## 2017-05-05 ENCOUNTER — Ambulatory Visit (INDEPENDENT_AMBULATORY_CARE_PROVIDER_SITE_OTHER): Payer: Medicare Other

## 2017-05-05 ENCOUNTER — Other Ambulatory Visit: Payer: Self-pay | Admitting: Gynecology

## 2017-05-05 DIAGNOSIS — M8580 Other specified disorders of bone density and structure, unspecified site: Secondary | ICD-10-CM

## 2017-05-05 DIAGNOSIS — M8589 Other specified disorders of bone density and structure, multiple sites: Secondary | ICD-10-CM

## 2017-05-11 ENCOUNTER — Encounter: Payer: Self-pay | Admitting: Cardiovascular Disease

## 2017-05-11 ENCOUNTER — Ambulatory Visit (INDEPENDENT_AMBULATORY_CARE_PROVIDER_SITE_OTHER): Payer: Medicare Other | Admitting: Cardiovascular Disease

## 2017-05-11 DIAGNOSIS — I340 Nonrheumatic mitral (valve) insufficiency: Secondary | ICD-10-CM | POA: Diagnosis not present

## 2017-05-11 NOTE — Progress Notes (Signed)
05/11/2017 [name]   [birth year]  [id number]  Primary Physician No primary care provider on file. Primary Cardiologist: Lorretta Harp MD Garret Reddish, [place], [place]  HPI:  Brenda Cobb is a 72 y.o.  thin and fit-appearing, married Caucasian female with no children who I last saw in the office 05/05/16 . She has a history of bileaflet severe mitral valve prolapse with regurgitation which she is totally asymptomatic from. She also has mild hyperlipidemia. Her recent 2D echo performed January 07, 2012, revealed normal LV size and function with mild concentric LVH, mildly dilated left atrium of 42 mm. Her echo really has not changed significantly since it was done prior to that.since I saw her a year ago she is completely asymptomatic. Recent 2-D echocardiogram performed 05/04/17 showed severe bileaflet prolapse with moderate to severe MR and basal septal hypertrophy with severe left atrial enlargement and grade 2 diastolic dysfunction.  Current Meds  Medication Sig  . amoxicillin (AMOXIL) 500 MG capsule TAKE 4 CAPSULES BY MOUTH 1 HOUR PRIOR TO DENTAL APPOINTMENT  . amoxicillin (AMOXIL) 500 MG capsule TAKE 4 CAPSULES BY MOUTH 1 HOUR BEFORE DENTAL APPOINTMENT  . aspirin 325 MG tablet Take 325 mg by mouth daily.  . Calcium Carbonate-Vitamin D (CALCIUM + D PO) Take 1 tablet by mouth daily.   . irbesartan (AVAPRO) 150 MG tablet TAKE 1 TABLET(150 MG) BY MOUTH DAILY  . levothyroxine (SYNTHROID, LEVOTHROID) 100 MCG tablet Take 100 mcg by mouth daily.    . Multiple Vitamin (MULTIVITAMIN) tablet Take 1 tablet by mouth daily.    Marland Kitchen zolpidem (AMBIEN) 10 MG tablet Take 0.25 mg by mouth at bedtime as needed for sleep.     No Known Allergies  Social History   Socioeconomic History  . Marital status: Married    Spouse name: Not on file  . Number of children: Not on file  . Years of education: Not on file  . Highest education level: Not on file  Social Needs  . Financial resource strain: Not  on file  . Food insecurity - worry: Not on file  . Food insecurity - inability: Not on file  . Transportation needs - medical: Not on file  . Transportation needs - non-medical: Not on file  Occupational History  . Not on file  Tobacco Use  . Smoking status: Never Smoker  . Smokeless tobacco: Never Used  Substance and Sexual Activity  . Alcohol use: Yes    Alcohol/week: 2.4 oz    Types: 4 Standard drinks or equivalent per week  . Drug use: No  . Sexual activity: No    Birth control/protection: Post-menopausal    Comment: 1st intercourse 72 yo-Fewer than 5 partners  Other Topics Concern  . Not on file  Social History Narrative  . Not on file     Review of Systems: General: negative for chills, fever, night sweats or weight changes.  Cardiovascular: negative for chest pain, dyspnea on exertion, edema, orthopnea, palpitations, paroxysmal nocturnal dyspnea or shortness of breath Dermatological: negative for rash Respiratory: negative for cough or wheezing Urologic: negative for hematuria Abdominal: negative for nausea, vomiting, diarrhea, bright red blood per rectum, melena, or hematemesis Neurologic: negative for visual changes, syncope, or dizziness All other systems reviewed and are otherwise negative except as noted above.    Blood pressure 126/78, pulse 64, height 5\' 4"  (1.626 m), weight 100 lb 12.8 oz (45.7 kg).  General appearance: alert and no distress Neck: no adenopathy, no  carotid bruit, no JVD, supple, symmetrical, trachea midline and thyroid not enlarged, symmetric, no tenderness/mass/nodules Lungs: clear to auscultation bilaterally Heart: 3/6 apical systolic murmur consistent with severe mitral regurgitation with late systolic click Extremities: extremities normal, atraumatic, no cyanosis or edema Pulses: 2+ and symmetric Skin: Skin color, texture, turgor normal. No rashes or lesions Neurologic: Alert and oriented X 3, normal strength and tone. Normal symmetric  reflexes. Normal coordination and gait  EKG sinus rhythm at 64 with nonspecific ST-T wave changes. I personally reviewed this EKG.  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 She is totally asymptomatic. We'll repeat her echo in 12 months.      Lorretta Harp MD FACP,FACC,FAHA, [facility] 05/11/2017 12:51 PM

## 2017-05-11 NOTE — Patient Instructions (Signed)
Medication Instructions: Your physician recommends that you continue on your current medications as directed. Please refer to the Current Medication list given to you today.   Testing/Procedures: Your physician has requested that you have an echocardiogram in 1 year. Echocardiography is a painless test that uses sound waves to create images of your heart. It provides your doctor with information about the size and shape of your heart and how well your heart's chambers and valves are working. This procedure takes approximately one hour. There are no restrictions for this procedure.   Follow-Up: Your physician wants you to follow-up in: 1 year with Dr. Gwenlyn Found. You will receive a reminder letter in the mail two months in advance. If you don't receive a letter, please call our office to schedule the follow-up appointment.  If you need a refill on your cardiac medications before your next appointment, please call your pharmacy.

## 2017-05-11 NOTE — Assessment & Plan Note (Signed)
History of bileaflet prolapse with moderate to severe MR and preserved LV function. She is totally asymptomatic. We'll repeat her echo in 12 months.

## 2017-06-27 ENCOUNTER — Telehealth: Payer: Self-pay | Admitting: Cardiovascular Disease

## 2017-06-27 NOTE — Telephone Encounter (Signed)
°*  STAT* If patient is at the pharmacy, call can be transferred to refill team.   1. Which medications need to be refilled? (please list name of each medication and dose if known) irbesartan (AVAPRO) 150 MG tablet     TAKE 1 TABLET(150 MG) BY MOUTH DAILY   2. Which pharmacy/location (including street and city if local pharmacy) is medication to be sent to?  [facility] [place], [place] [phone] (Phone) [phone] (Fax)   3. Do they need a 30 day or 90 day supply? [place]

## 2017-06-28 MED ORDER — IRBESARTAN 150 MG PO TABS: 150.0000 mg | ORAL_TABLET | Freq: Every day | ORAL | 0 refills | Status: DC

## 2017-06-28 NOTE — Telephone Encounter (Signed)
Rx has been sent to the pharmacy electronically. ° °

## 2017-07-28 ENCOUNTER — Other Ambulatory Visit: Payer: Self-pay | Admitting: Cardiovascular Disease

## 2017-07-28 ENCOUNTER — Other Ambulatory Visit: Payer: Self-pay

## 2017-07-28 MED ORDER — IRBESARTAN 150 MG PO TABS: 150.0000 mg | ORAL_TABLET | Freq: Every day | ORAL | 6 refills | Status: DC

## 2018-02-20 ENCOUNTER — Other Ambulatory Visit: Payer: Self-pay | Admitting: Cardiovascular Disease

## 2018-03-01 ENCOUNTER — Encounter: Payer: Medicare Other | Admitting: Gynecology

## 2018-03-03 ENCOUNTER — Ambulatory Visit (INDEPENDENT_AMBULATORY_CARE_PROVIDER_SITE_OTHER): Payer: Medicare Other | Admitting: Gynecology

## 2018-03-03 ENCOUNTER — Encounter: Payer: Self-pay | Admitting: Gynecology

## 2018-03-03 VITALS — BP 116/76 | Ht 63.0 in | Wt 97.0 lb

## 2018-03-03 DIAGNOSIS — Z01419 Encounter for gynecological examination (general) (routine) without abnormal findings: Secondary | ICD-10-CM

## 2018-03-03 DIAGNOSIS — N952 Postmenopausal atrophic vaginitis: Secondary | ICD-10-CM

## 2018-03-03 DIAGNOSIS — Z124 Encounter for screening for malignant neoplasm of cervix: Secondary | ICD-10-CM | POA: Diagnosis not present

## 2018-03-03 DIAGNOSIS — M8580 Other specified disorders of bone density and structure, unspecified site: Secondary | ICD-10-CM

## 2018-03-03 NOTE — Addendum Note (Signed)
Addended by: Nelva Nay on: 03/03/2018 10:31 AM   Modules accepted: Orders

## 2018-03-03 NOTE — Progress Notes (Signed)
    Brenda Cobb [birth year] [id number]        72 y.o.  G0P0 for breast and pelvic exam.  Doing well without gynecologic complaints  Past medical history,surgical history, problem list, medications, allergies, family history and social history were all reviewed and documented as reviewed in the EPIC chart.  ROS:  Performed with pertinent positives and negatives included in the history, assessment and plan.   Additional significant findings : None   Exam: [name] assistant Vitals:   03/03/18 0917  BP: 116/76  Weight: 97 lb (44 kg)  Height: 5\' 3"  (1.6 m)   Body mass index is 17.18 kg/m.  General appearance:  Normal affect, orientation and appearance. Skin: Grossly normal HEENT: Without gross lesions.  No cervical or supraclavicular adenopathy. Thyroid normal.  Lungs:  Clear without wheezing, rales or rhonchi Cardiac: RR, without RMG Abdominal:  Soft, nontender, without masses, guarding, rebound, organomegaly or hernia Breasts:  Examined lying and sitting without masses, retractions, discharge or axillary adenopathy. Pelvic:  Ext, BUS, Vagina: With atrophic changes  Cervix: With atrophic changes.  Pap smear done  Uterus: Anteverted, normal size, shape and contour, midline and mobile nontender   Adnexa: Without masses or tenderness    Anus and perineum: Normal   Rectovaginal: Normal sphincter tone without palpated masses or tenderness.    Assessment/Plan:  72 y.o. G0P0 female for breast and pelvic exam.   1. Postmenopausal.  No significant menopausal symptoms or any vaginal bleeding. 2. Osteopenia.  DEXA 04/2017 T score -1.2 FRAX 7% / 0.9%.  Plan repeat DEXA in several years. 3. Mammography 04/2017.  Continue with annual mammography next year.  Breast exam normal today. 4. Colonoscopy 2006.  Recommend patient schedule screening colonoscopy or discuss Cologuard with her primary physician. 5. Pap smear 2016.  Pap smear done today.  Patient uncomfortable with stop screening  recommendations.  No history of abnormal Pap smears previously. 6. Health maintenance.  No routine lab work done as patient does this elsewhere.  Follow-up 1 year, sooner as needed.   [name] MD, 9:45 AM 03/03/2018

## 2018-03-03 NOTE — Patient Instructions (Signed)
Schedule a screening colonoscopy or discussed the possibilities of Cologuard with your primary physician.  Follow-up in 1 year for annual exam.

## 2018-03-07 LAB — PAP IG W/ RFLX HPV ASCU

## 2018-05-09 ENCOUNTER — Ambulatory Visit (HOSPITAL_COMMUNITY): Payer: Medicare Other | Attending: Cardiovascular Disease

## 2018-05-09 DIAGNOSIS — I340 Nonrheumatic mitral (valve) insufficiency: Secondary | ICD-10-CM | POA: Insufficient documentation

## 2018-05-12 ENCOUNTER — Encounter: Payer: Self-pay | Admitting: Cardiovascular Disease

## 2018-05-12 ENCOUNTER — Ambulatory Visit (INDEPENDENT_AMBULATORY_CARE_PROVIDER_SITE_OTHER): Payer: Medicare Other | Admitting: Cardiovascular Disease

## 2018-05-12 DIAGNOSIS — I340 Nonrheumatic mitral (valve) insufficiency: Secondary | ICD-10-CM | POA: Diagnosis not present

## 2018-05-12 MED ORDER — IRBESARTAN 150 MG PO TABS: 150.0000 mg | ORAL_TABLET | Freq: Every day | ORAL | 3 refills | Status: DC

## 2018-05-12 MED ORDER — AMOXICILLIN 500 MG PO CAPS: 2000.0000 mg | ORAL_CAPSULE | ORAL | 5 refills | Status: DC

## 2018-05-12 MED ORDER — ASPIRIN 81 MG PO TABS: 81.0000 mg | ORAL_TABLET | Freq: Every day | ORAL | 3 refills | Status: DC

## 2018-05-12 NOTE — Progress Notes (Signed)
05/12/2018 Brenda Cobb   08-Aug-1945  [id number]  Primary Physician Dhivianathan, Candida Peeling, MD Primary Cardiologist: Lorretta Harp MD FACP, [place], [place], [place]  HPI:  Brenda Cobb is a 73 y.o.  thin and fit-appearing, married Caucasian female with no children who I last saw in the office  05/11/2017 . She has a history of bileaflet severe mitral valve prolapse with regurgitation which she is totally asymptomatic from. She also has mild hyperlipidemia. Her recent 2D echo performed January 07, 2012, revealed normal LV size and function with mild concentric LVH, mildly dilated left atrium of 42 mm. Her echo really has not changed significantly since it was done prior to that.since I saw her a year ago she is completely asymptomatic. Recent 2-D echocardiogram performed 05/09/2018 showed severe bileaflet prolapse with moderate to severe MR and basal septal hypertrophy with severe left atrial enlargement unchanged from her prior echo.  Current Meds  Medication Sig  . amoxicillin (AMOXIL) 500 MG capsule TAKE 4 CAPSULES BY MOUTH 1 HOUR BEFORE DENTAL APPOINTMENT  . aspirin 325 MG tablet Take 325 mg by mouth daily.  . Calcium Carbonate-Vitamin D (CALCIUM + D PO) Take 1 tablet by mouth daily.   . irbesartan (AVAPRO) 150 MG tablet TAKE 1 TABLET(150 MG) BY MOUTH DAILY  . levothyroxine (SYNTHROID, LEVOTHROID) 100 MCG tablet Take 100 mcg by mouth daily.    . Multiple Vitamin (MULTIVITAMIN) tablet Take 1 tablet by mouth daily.    Marland Kitchen zolpidem (AMBIEN) 10 MG tablet Take 0.25 mg by mouth at bedtime as needed for sleep.  . [DISCONTINUED] amoxicillin (AMOXIL) 500 MG capsule TAKE 4 CAPSULES BY MOUTH 1 HOUR PRIOR TO DENTAL APPOINTMENT  . [DISCONTINUED] irbesartan (AVAPRO) 150 MG tablet TAKE 1 TABLET(150 MG) BY MOUTH DAILY     No Known Allergies  Social History   Socioeconomic History  . Marital status: Married    Spouse name: Not on file  . Number of children: Not on file  . Years of education: Not  on file  . Highest education level: Not on file  Occupational History  . Not on file  Social Needs  . Financial resource strain: Not on file  . Food insecurity:    Worry: Not on file    Inability: Not on file  . Transportation needs:    Medical: Not on file    Non-medical: Not on file  Tobacco Use  . Smoking status: Never Smoker  . Smokeless tobacco: Never Used  Substance and Sexual Activity  . Alcohol use: Yes    Alcohol/week: 4.0 standard drinks    Types: 4 Standard drinks or equivalent per week  . Drug use: No  . Sexual activity: Never    Birth control/protection: Post-menopausal    Comment: 1st intercourse 73 yo-Fewer than 5 partners  Lifestyle  . Physical activity:    Days per week: Not on file    Minutes per session: Not on file  . Stress: Not on file  Relationships  . Social connections:    Talks on phone: Not on file    Gets together: Not on file    Attends religious service: Not on file    Active member of club or organization: Not on file    Attends meetings of clubs or organizations: Not on file    Relationship status: Not on file  . Intimate partner violence:    Fear of current or ex partner: Not on file    Emotionally abused: Not  on file    Physically abused: Not on file    Forced sexual activity: Not on file  Other Topics Concern  . Not on file  Social History Narrative  . Not on file     Review of Systems: General: negative for chills, fever, night sweats or weight changes.  Cardiovascular: negative for chest pain, dyspnea on exertion, edema, orthopnea, palpitations, paroxysmal nocturnal dyspnea or shortness of breath Dermatological: negative for rash Respiratory: negative for cough or wheezing Urologic: negative for hematuria Abdominal: negative for nausea, vomiting, diarrhea, bright red blood per rectum, melena, or hematemesis Neurologic: negative for visual changes, syncope, or dizziness All other systems reviewed and are otherwise negative  except as noted above.    Blood pressure 120/62, pulse 63, height 5\' 4"  (1.626 m), weight 101 lb (45.8 kg), SpO2 98 %.  General appearance: alert and no distress Neck: no adenopathy, no carotid bruit, no JVD, supple, symmetrical, trachea midline and thyroid not enlarged, symmetric, no tenderness/mass/nodules Lungs: clear to auscultation bilaterally Heart: 3/6 apical systolic murmur consistent with severe MR with a systolic click consistent with mitral valve prolapse Extremities: extremities normal, atraumatic, no cyanosis or edema Pulses: 2+ and symmetric Skin: Skin color, texture, turgor normal. No rashes or lesions Neurologic: Alert and oriented X 3, normal strength and tone. Normal symmetric reflexes. Normal coordination and gait  EKG sinus rhythm at 61 with nonspecific ST and T wave changes. 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  This will be repeated on an annual basis.      Lorretta Harp MD FACP,FACC,FAHA, [facility] 05/12/2018 11:40 AM

## 2018-05-12 NOTE — Assessment & Plan Note (Signed)
History of severe bileaflet mitral valve prolapse with moderate MR and recent echo which is done 05/09/2018 (done on annual basis) showing normal LV size and function.  This will be repeated on an annual basis.

## 2018-05-12 NOTE — Patient Instructions (Addendum)
Medication Instructions:  Your physician has recommended you make the following change in your medication:   DECREASE ASPIRIN TO 81 MG DAILY  If you need a refill on your cardiac medications before your next appointment, please call your pharmacy.   Lab work: NONE If you have labs (blood work) drawn today and your tests are completely normal, you will receive your results only by: Marland Kitchen MyChart Message (if you have MyChart) OR . A paper copy in the mail If you have any lab test that is abnormal or we need to change your treatment, we will call you to review the results.  Testing/Procedures: Your physician has requested that you have an echocardiogram. Echocardiography is a painless test that uses sound waves to create images of your heart. It provides your doctor with information about the size and shape of your heart and how well your heart's chambers and valves are working. This procedure takes approximately one hour. There are no restrictions for this procedure. [place], [place], [place] [address]  SCHEDULE February 2021  Follow-Up: At [facility] you and your health needs are our priority.  As part of our continuing mission to provide you with exceptional heart care, we have created designated Provider Care Teams.  These Care Teams include your primary Cardiologist (physician) and Advanced Practice Providers (APPs -  Physician Assistants and Nurse Practitioners) who all work together to provide you with the care you need, when you need it. . You will need a follow up appointment in 12 months.  Please call our office 2 months in advance to schedule this appointment.  You may see Dr. Gwenlyn Found or one of the following Advanced Practice Providers on your designated Care Team:   . Kerin Ransom, [place] . Almyra Deforest, PA-C . Fabian Sharp, PA-C . Jory Sims, DNP . Rosaria Ferries, PA-C . Roby Lofts, PA-C . Sande Rives, PA-C

## 2018-05-20 ENCOUNTER — Other Ambulatory Visit: Payer: Self-pay | Admitting: Cardiovascular Disease

## 2018-05-24 ENCOUNTER — Encounter: Payer: Self-pay | Admitting: Gynecology

## 2018-12-20 ENCOUNTER — Encounter: Payer: Self-pay | Admitting: Gynecology

## 2019-03-07 NOTE — Telephone Encounter (Signed)
completed

## 2019-03-28 ENCOUNTER — Encounter: Payer: Medicare Other | Admitting: Gynecology

## 2019-04-13 ENCOUNTER — Encounter: Payer: Medicare Other | Admitting: Obstetrics and Gynecology

## 2019-04-17 ENCOUNTER — Other Ambulatory Visit: Payer: Self-pay

## 2019-04-18 ENCOUNTER — Encounter: Payer: Self-pay | Admitting: Obstetrics and Gynecology

## 2019-04-18 ENCOUNTER — Ambulatory Visit (INDEPENDENT_AMBULATORY_CARE_PROVIDER_SITE_OTHER): Payer: Medicare Other | Admitting: Obstetrics and Gynecology

## 2019-04-18 VITALS — BP 116/76 | Ht 64.0 in | Wt 100.0 lb

## 2019-04-18 DIAGNOSIS — Z01419 Encounter for gynecological examination (general) (routine) without abnormal findings: Secondary | ICD-10-CM | POA: Diagnosis not present

## 2019-04-18 DIAGNOSIS — Z780 Asymptomatic menopausal state: Secondary | ICD-10-CM

## 2019-04-18 DIAGNOSIS — M8580 Other specified disorders of bone density and structure, unspecified site: Secondary | ICD-10-CM

## 2019-04-18 NOTE — Progress Notes (Signed)
   Brenda Cobb [birth year] [id number]  SUBJECTIVE:  74 y.o. G0P0 female for annual routine gynecologic exam. She has no gynecologic concerns.  Current Outpatient Medications  Medication Sig Dispense Refill  . aspirin 81 MG tablet Take 1 tablet (81 mg total) by mouth daily. 90 tablet 3  . irbesartan (AVAPRO) 150 MG tablet TAKE 1 TABLET(150 MG) BY MOUTH DAILY 30 tablet 1  . levothyroxine (SYNTHROID) 75 MCG tablet Take 75 mcg by mouth daily before breakfast.    . Multiple Vitamin (MULTIVITAMIN) tablet Take 1 tablet by mouth daily.      Marland Kitchen zolpidem (AMBIEN) 10 MG tablet Take 0.25 mg by mouth at bedtime as needed for sleep.    Marland Kitchen amoxicillin (AMOXIL) 500 MG capsule Take 4 capsules (2,000 mg total) by mouth as directed. Take 1 hour prior to dental appointments. (Patient not taking: Reported on 04/18/2019) 4 capsule 5   No current facility-administered medications for this visit.   Allergies: Patient has no known allergies.  No LMP recorded. Patient is postmenopausal.  Past medical history,surgical history, problem list, medications, allergies, family history and social history were all reviewed and documented as reviewed in the EPIC chart.  ROS:  Feeling well. No dyspnea or chest pain on exertion.  No abdominal pain, change in bowel habits, black or bloody stools.  No urinary tract symptoms. GYN ROS: normal menses, no abnormal bleeding, pelvic pain or discharge, no breast pain or new or enlarging lumps on self exam. No neurological complaints.    OBJECTIVE:  BP 116/76   Ht 5\' 4"  (1.626 m)   Wt 100 lb (45.4 kg)   BMI 17.16 kg/m  The patient appears well, alert, oriented x 3, in no distress. ENT normal.  Neck supple. No cervical or supraclavicular adenopathy or thyromegaly.  Lungs are clear, good air entry, no wheezes, rhonchi or rales. S1 and S2 normal, +holosystolic murmur, regular rate and rhythm.  Abdomen soft without tenderness, guarding, mass or organomegaly.  Neurological is normal,  no focal findings.  BREAST EXAM: breasts appear normal, no suspicious masses, no skin or nipple changes or axillary nodes  PELVIC EXAM: VULVA: normal appearing vulva with no masses, tenderness or lesions, atrophic changes, VAGINA: normal appearing vagina with normal color and discharge, no lesions, CERVIX: normal appearing cervix without discharge or lesions, UTERUS: uterus is normal size, shape, consistency and nontender, anteverted, ADNEXA: normal adnexa in size, nontender and no masses, RECTAL: normal rectal, no masses Chaperone: [name] present during the examination  ASSESSMENT:  74 y.o. G0P0 here for annual gynecologic exam  PLAN:   1. Postmenopausal.  No significant menopausal symptoms or vaginal bleeding. 2. Pap smear 03/2017 normal.. Not repeated today. Next Pap smear due 2022 following the current screening guidelines calling for the 3-year interval, and she preferred to continue screening at the time of her last exam. 3. Mammogram 01/2019. Will continue with annual mammography. Breast exam normal today. 4. Colonoscopy 2006. Recommended that she schedule another screening but she would like to defer until after the pandemic has settled down.   5. Osteopenia.  DEXA 04/2017 T score -1.2 FRAX 7% / 0.9%.  Recommend repeating DEXA this year.  She will plan to schedule. 6. Health maintenance.  No lab work as she has this completed with her primary care provider.  Followed by cardiology for mitral regurgitation.    Return annually or sooner, prn.  Joseph Pierini MD  04/18/19

## 2019-04-18 NOTE — Patient Instructions (Addendum)
We will plan to repeat the DEXA scan this year.  Continue weight bearing exercise, and vitamin D/calcium intake.  Please schedule this study when convenient this year.

## 2019-04-20 NOTE — Addendum Note (Signed)
Addended by: Nelva Nay on: 04/20/2019 09:41 AM   Modules accepted: Orders

## 2019-05-17 ENCOUNTER — Other Ambulatory Visit: Payer: Self-pay

## 2019-05-17 ENCOUNTER — Ambulatory Visit (HOSPITAL_COMMUNITY): Payer: Medicare Other | Attending: Cardiology

## 2019-05-17 DIAGNOSIS — I340 Nonrheumatic mitral (valve) insufficiency: Secondary | ICD-10-CM | POA: Diagnosis not present

## 2019-05-17 DIAGNOSIS — I341 Nonrheumatic mitral (valve) prolapse: Secondary | ICD-10-CM | POA: Insufficient documentation

## 2019-05-17 DIAGNOSIS — I351 Nonrheumatic aortic (valve) insufficiency: Secondary | ICD-10-CM | POA: Insufficient documentation

## 2019-05-22 ENCOUNTER — Ambulatory Visit (INDEPENDENT_AMBULATORY_CARE_PROVIDER_SITE_OTHER): Payer: Medicare Other | Admitting: Cardiovascular Disease

## 2019-05-22 ENCOUNTER — Encounter: Payer: Self-pay | Admitting: Cardiovascular Disease

## 2019-05-22 ENCOUNTER — Other Ambulatory Visit: Payer: Self-pay

## 2019-05-22 DIAGNOSIS — I340 Nonrheumatic mitral (valve) insufficiency: Secondary | ICD-10-CM | POA: Diagnosis not present

## 2019-05-22 MED ORDER — AMOXICILLIN 500 MG PO CAPS: 2000.0000 mg | ORAL_CAPSULE | ORAL | 5 refills | Status: DC

## 2019-05-22 MED ORDER — IRBESARTAN 150 MG PO TABS: ORAL_TABLET | ORAL | 3 refills | Status: DC

## 2019-05-22 NOTE — Assessment & Plan Note (Signed)
History of severe mitral valve prolapse with severe MR and normal LV function by recent 2D echo 05/17/2019.  She does have a severely dilated left atrium.  She is completely asymptomatic and walks several miles a day without limitation.  We will recheck a 2D echo in 12 months.

## 2019-05-22 NOTE — Patient Instructions (Signed)
Medication Instructions:  Your physician recommends that you continue on your current medications as directed. Please refer to the Current Medication list given to you today.  If you need a refill on your cardiac medications before your next appointment, please call your pharmacy.   Lab work: NONE  Testing/Procedures: Your physician has requested that you have an echocardiogram in [place]. Echocardiography is a painless test that uses sound waves to create images of your heart. It provides your doctor with information about the size and shape of your heart and how well your heart's chambers and valves are working. This procedure takes approximately one hour. There are no restrictions for this procedure. [address]  Follow-Up: At [facility], you and your health needs are our priority.  As part of our continuing mission to provide you with exceptional heart care, we have created designated Provider Care Teams.  These Care Teams include your primary Cardiologist (physician) and Advanced Practice Providers (APPs -  Physician Assistants and Nurse Practitioners) who all work together to provide you with the care you need, when you need it. You may see Dr. Gwenlyn Found or one of the following Advanced Practice Providers on your designated Care Team:    Kerin Ransom, PA-C  [place], [place]  Coletta Memos, [place]  Your physician wants you to follow-up in: 1 year with Dr. Gwenlyn Found. You will receive a reminder letter in the mail two months in advance. If you don't receive a letter, please call our office to schedule the follow-up appointment.

## 2019-05-22 NOTE — Progress Notes (Signed)
05/22/2019 [name]   06/03/1945  [id number]  Primary Physician Dhivianathan, Candida Peeling, MD Primary Cardiologist: Lorretta Harp MD FACP, [place], [place], [place]  HPI:  Brenda CURL is a 74 y.o.   thin and fit-appearing, married Caucasian female with no children who I last saw in the office  05/12/2018.She has a history of bileaflet severe mitral valve prolapse with regurgitation which she is totally asymptomatic from. She also has mild hyperlipidemia. Her recent 2D echo performed January 07, 2012, revealed normal LV size and function with mild concentric LVH, mildly dilated left atrium of 42 mm. Her echo really has not changed significantly since it was done prior to that.since I saw her a year ago she is completely asymptomatic.Recent 2-D echocardiogram performed  05/17/2019 showed severe bileaflet prolapse with moderate to severe MR and basal septal hypertrophy with severe left atrial enlargement unchanged from her prior echo.  She just got a new puppy, Springer spaniel, and [place] several miles at a time without limitation or symptoms.    Current Meds  Medication Sig  . aspirin 81 MG tablet Take 1 tablet (81 mg total) by mouth daily.  . irbesartan (AVAPRO) 150 MG tablet TAKE 1 TABLET(150 MG) BY MOUTH DAILY  . levothyroxine (SYNTHROID) 75 MCG tablet Take 75 mcg by mouth daily before breakfast.  . Multiple Vitamin (MULTIVITAMIN) tablet Take 1 tablet by mouth daily.    Marland Kitchen zolpidem (AMBIEN) 10 MG tablet Take 0.25 mg by mouth at bedtime as needed for sleep.     No Known Allergies  Social History   Socioeconomic History  . Marital status: Married    Spouse name: Not on file  . Number of children: Not on file  . Years of education: Not on file  . Highest education level: Not on file  Occupational History  . Not on file  Tobacco Use  . Smoking status: Never Smoker  . Smokeless tobacco: Never Used  Substance and Sexual Activity  . Alcohol  use: Yes    Alcohol/week: 4.0 standard drinks    Types: 4 Standard drinks or equivalent per week  . Drug use: No  . Sexual activity: Never    Birth control/protection: Post-menopausal    Comment: 1st intercourse 74 yo-Fewer than 5 partners  Other Topics Concern  . Not on file  Social History Narrative  . Not on file   Social Determinants of Health   Financial Resource Strain:   . Difficulty of Paying Living Expenses: Not on file  Food Insecurity:   . Worried About Charity fundraiser in the Last Year: Not on file  . Ran Out of Food in the Last Year: Not on file  Transportation Needs:   . Lack of Transportation (Medical): Not on file  . Lack of Transportation (Non-Medical): Not on file  Physical Activity:   . Days of Exercise per Week: Not on file  . Minutes of Exercise per Session: Not on file  Stress:   . Feeling of Stress : Not on file  Social Connections:   . Frequency of Communication with Friends and Family: Not on file  . Frequency of Social Gatherings with Friends and Family: Not on file  . Attends Religious Services: Not on file  . Active Member of Clubs or Organizations: Not on file  . Attends Archivist Meetings: Not on file  . Marital Status: Not on file  Intimate Partner Violence:   .  Fear of Current or Ex-Partner: Not on file  . Emotionally Abused: Not on file  . Physically Abused: Not on file  . Sexually Abused: Not on file     Review of Systems: General: negative for chills, fever, night sweats or weight changes.  Cardiovascular: negative for chest pain, dyspnea on exertion, edema, orthopnea, palpitations, paroxysmal nocturnal dyspnea or shortness of breath Dermatological: negative for rash Respiratory: negative for cough or wheezing Urologic: negative for hematuria Abdominal: negative for nausea, vomiting, diarrhea, bright red blood per rectum, melena, or hematemesis Neurologic: negative for visual changes, syncope, or dizziness All other  systems reviewed and are otherwise negative except as noted above.    Blood pressure 128/72, pulse 76, temperature 97.7 F (36.5 C), resp. rate (!) 21, height 5\' 4"  (1.626 m), weight 101 lb 12.8 oz (46.2 kg), SpO2 96 %.  General appearance: alert and no distress Neck: no adenopathy, no carotid bruit, no JVD, supple, symmetrical, trachea midline and thyroid not enlarged, symmetric, no tenderness/mass/nodules Lungs: clear to auscultation bilaterally Heart: 2-3 over 6 apical systolic murmur with late systolic click consistent with mitral valve prolapse with MR. Extremities: extremities normal, atraumatic, no cyanosis or edema Pulses: 2+ and symmetric Skin: Skin color, texture, turgor normal. No rashes or lesions Neurologic: Alert and oriented X 3, normal strength and tone. Normal symmetric reflexes. Normal coordination and gait  EKG sinus rhythm at 68 with nonspecific ST and T wave changes.  I personally reviewed this EKG.  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  She does have a severely dilated left atrium.  She is completely asymptomatic and walks several miles a day without limitation.  We will recheck a 2D echo in 12 months.      Lorretta Harp MD FACP,FACC,FAHA, [facility] 05/22/2019 11:14 AM

## 2020-04-21 ENCOUNTER — Encounter: Payer: Medicare Other | Admitting: Obstetrics and Gynecology

## 2020-04-23 ENCOUNTER — Ambulatory Visit: Payer: Medicare Other | Admitting: Obstetrics and Gynecology

## 2020-04-25 ENCOUNTER — Ambulatory Visit (INDEPENDENT_AMBULATORY_CARE_PROVIDER_SITE_OTHER): Payer: Medicare Other | Admitting: Obstetrics and Gynecology

## 2020-04-25 ENCOUNTER — Encounter: Payer: Self-pay | Admitting: Obstetrics and Gynecology

## 2020-04-25 ENCOUNTER — Other Ambulatory Visit: Payer: Self-pay

## 2020-04-25 VITALS — BP 126/60 | HR 68 | Ht 63.25 in | Wt 101.0 lb

## 2020-04-25 DIAGNOSIS — M8588 Other specified disorders of bone density and structure, other site: Secondary | ICD-10-CM

## 2020-04-25 DIAGNOSIS — Z124 Encounter for screening for malignant neoplasm of cervix: Secondary | ICD-10-CM | POA: Diagnosis not present

## 2020-04-25 DIAGNOSIS — Z01411 Encounter for gynecological examination (general) (routine) with abnormal findings: Secondary | ICD-10-CM

## 2020-04-25 DIAGNOSIS — M8580 Other specified disorders of bone density and structure, unspecified site: Secondary | ICD-10-CM

## 2020-04-25 DIAGNOSIS — R1900 Intra-abdominal and pelvic swelling, mass and lump, unspecified site: Secondary | ICD-10-CM

## 2020-04-25 NOTE — Progress Notes (Signed)
   Brenda Cobb 10/08/1945 [id number]  SUBJECTIVE:  75 y.o. G0P0 female for annual routine gynecologic exam. She has no gynecologic concerns.  Current Outpatient Medications  Medication Sig Dispense Refill  . amoxicillin (AMOXIL) 500 MG capsule Take 4 capsules (2,000 mg total) by mouth as directed. Take 1 hour prior to dental appointments. 4 capsule 5  . aspirin 81 MG tablet Take 1 tablet (81 mg total) by mouth daily. 90 tablet 3  . irbesartan (AVAPRO) 150 MG tablet TAKE 1 TABLET(150 MG) BY MOUTH DAILY 90 tablet 3  . levothyroxine (SYNTHROID) 75 MCG tablet Take 75 mcg by mouth daily before breakfast.    . Multiple Vitamin (MULTIVITAMIN) tablet Take 1 tablet by mouth daily.    Marland Kitchen zolpidem (AMBIEN) 10 MG tablet Take 0.25 mg by mouth at bedtime as needed for sleep.     No current facility-administered medications for this visit.   Allergies: Patient has no known allergies.  No LMP recorded. Patient is postmenopausal.  Past medical history,surgical history, problem list, medications, allergies, family history and social history were all reviewed and documented as reviewed in the EPIC chart.  ROS: Pertinent positives and negatives as reviewed in HPI   OBJECTIVE:  BP 126/60 (BP Location: Right Arm, Patient Position: Sitting, Cuff Size: Normal)   Pulse 68   Ht 5' 3.25" (1.607 m)   Wt 101 lb (45.8 kg)   BMI 17.75 kg/m  The patient appears well, alert, oriented, in no distress.  BREAST EXAM: breasts appear normal, no suspicious masses, no skin or nipple changes or axillary nodes  PELVIC EXAM: VULVA: normal appearing vulva with no masses, tenderness or lesions, atrophic changes, VAGINA: normal appearing vagina with normal color and discharge, no lesions, CERVIX: normal appearing cervix without discharge or lesions, UTERUS: Density in midline pelvis and posteriorly along uterus, difficult to discern versus bony pelvic structures to orientation of uterus, ADNEXA: normal adnexa in size,  nontender and no masses, RECTAL: normal rectal, no masses Chaperone: Terence Lux present during the examination  ASSESSMENT:  75 y.o. G0P0 here for annual gynecologic exam  PLAN:   1. Postmenopausal.  No significant menopausal symptoms or vaginal bleeding. 2. Pap smear 03/2017 normal.  Pap smear is collected today as she does not want to discontinue Pap smear screening after discussing the guidelines. 3.  Possible pelvic mass.  Uncertain if I am feeling firm abdominal musculature or bony pelvic structures given her low BMI, but with the palpable density from within the vagina posterior to the cervix I would like her to get a pelvic ultrasound to better define her pelvic organ structure.  She will plan to schedule a checkout today. 4. Mammogram 06/2019. Will continue with annual mammography. Breast exam normal today. 5. Colonoscopy 2006. Recommended that she schedule another screening, particularly given she is overdue and the question of possible pelvic mass detected on the exam today.  She will first complete pelvic ultrasound as noted above. 6. Osteopenia.  DEXA 04/2017 T score -1.2 FRAX 7% / 0.9%.  Recommend repeating DEXA last year and this was not done, so I recommend scheduling this year as able.  She will plan to schedule. 7. Health maintenance.  No lab work as she has this completed with her primary care provider.  Followed by cardiology for mitral regurgitation.    Return within next week or two for pelvic ultrasound.   Joseph Pierini MD  04/25/20

## 2020-04-28 LAB — PAP IG W/ RFLX HPV ASCU

## 2020-04-29 ENCOUNTER — Encounter: Payer: Self-pay | Admitting: Obstetrics and Gynecology

## 2020-05-15 ENCOUNTER — Ambulatory Visit (INDEPENDENT_AMBULATORY_CARE_PROVIDER_SITE_OTHER): Payer: Medicare Other

## 2020-05-15 ENCOUNTER — Ambulatory Visit (INDEPENDENT_AMBULATORY_CARE_PROVIDER_SITE_OTHER): Payer: Medicare Other | Admitting: Obstetrics and Gynecology

## 2020-05-15 ENCOUNTER — Encounter: Payer: Self-pay | Admitting: Obstetrics and Gynecology

## 2020-05-15 ENCOUNTER — Telehealth: Payer: Self-pay | Admitting: *Deleted

## 2020-05-15 ENCOUNTER — Other Ambulatory Visit: Payer: Self-pay

## 2020-05-15 VITALS — BP 132/70 | HR 81 | Resp 99 | Ht 63.25 in | Wt 101.0 lb

## 2020-05-15 DIAGNOSIS — R1900 Intra-abdominal and pelvic swelling, mass and lump, unspecified site: Secondary | ICD-10-CM

## 2020-05-15 DIAGNOSIS — Z01411 Encounter for gynecological examination (general) (routine) with abnormal findings: Secondary | ICD-10-CM

## 2020-05-15 NOTE — Telephone Encounter (Signed)
-----   Message from Joseph Pierini, MD sent at 05/15/2020  9:20 AM EST ----- Regarding: pelvic MRI This patient has a newly discovered pelvic mass and she had a pelvic ultrasound today, but I would like her to get a pelvic MRI for better characterization and to be more certain that it is just a fibroid

## 2020-05-15 NOTE — Progress Notes (Signed)
   Brenda Cobb January 08, 1946 [id number]  SUBJECTIVE:  75 y.o. G0P0 female presents for a pelvic ultrasound to evaluate a pelvic mass detected on her routine pelvic exam last month.  She does not have any known history of fibroids but there was a density noted in her midline pelvis on the recent exam.  She is not having any pain, change in bowel movements or appetite, nor vaginal bleeding.  She does bring her friend in today for our discussion.   Current Outpatient Medications  Medication Sig Dispense Refill  . amoxicillin (AMOXIL) 500 MG capsule Take 4 capsules (2,000 mg total) by mouth as directed. Take 1 hour prior to dental appointments. 4 capsule 5  . aspirin 81 MG tablet Take 1 tablet (81 mg total) by mouth daily. 90 tablet 3  . irbesartan (AVAPRO) 150 MG tablet TAKE 1 TABLET(150 MG) BY MOUTH DAILY 90 tablet 3  . levothyroxine (SYNTHROID) 75 MCG tablet Take 75 mcg by mouth daily before breakfast.    . Multiple Vitamin (MULTIVITAMIN) tablet Take 1 tablet by mouth daily.    Marland Kitchen zolpidem (AMBIEN) 10 MG tablet Take 0.25 mg by mouth at bedtime as needed for sleep.     No current facility-administered medications for this visit.   Allergies: Patient has no known allergies.  No LMP recorded. Patient is postmenopausal.  Past medical history,surgical history, problem list, medications, allergies, family history and social history were all reviewed and documented as reviewed in the EPIC chart.    OBJECTIVE:  BP 132/70   Pulse 81   Resp (!) 99   Ht 5' 3.25" (1.607 m)   Wt 101 lb (45.8 kg)   BMI 17.75 kg/m   PELVIC EXAM: Deferred  Pelvic ultrasound Anteverted enlarged uterus 7.4 x 9.8 cm with fibroid mass 9.9 x 11.6 mm attached inferiorly and to the right in the lower uterine segment.  Uterus is distorted to the left pelvis. Symmetrical endometrium approximately 4 mm but difficult to visualize due to distortion from the fibroid. Normal bilateral ovaries. No adnexal masses.  No pelvic  free fluid.  ASSESSMENT:  75 y.o. 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  I think for better characterization given its large size and the fact that it has not been detected previously on her exams, I want to get a pelvic MRI for more reassurance that it is a leiomyoma rather than another more concerning pelvic mass.  We discussed possibility of leiomyosarcoma or other malignant pelvic masses if this is in fact a newly developed mass.  I do not see any other imaging in the EMR to compare today's imaging to.  I will have staff call the patient to coordinate the pelvic MRI. She knows to make sure that she seeks follow-up with one of my physician partners as I will no longer be at the practice after this week.   Joseph Pierini MD 05/15/20

## 2020-05-16 NOTE — Telephone Encounter (Signed)
Patient scheduled on 05/29/20 @ 1:00pm at [facility]. Will route to [facility] to check benefits.

## 2020-05-16 NOTE — Telephone Encounter (Signed)
Number provided by patient to call and schedule at [facility] order placed.

## 2020-05-21 ENCOUNTER — Ambulatory Visit (HOSPITAL_COMMUNITY): Payer: Medicare Other | Attending: Cardiology

## 2020-05-21 ENCOUNTER — Other Ambulatory Visit: Payer: Self-pay

## 2020-05-21 DIAGNOSIS — I340 Nonrheumatic mitral (valve) insufficiency: Secondary | ICD-10-CM | POA: Insufficient documentation

## 2020-05-21 LAB — ECHOCARDIOGRAM COMPLETE
Area-P 1/2: 2.62 cm2
P 1/2 time: 618 msec
S' Lateral: 2.8 cm

## 2020-05-23 ENCOUNTER — Ambulatory Visit (INDEPENDENT_AMBULATORY_CARE_PROVIDER_SITE_OTHER): Payer: Medicare Other | Admitting: Cardiovascular Disease

## 2020-05-23 ENCOUNTER — Other Ambulatory Visit: Payer: Self-pay

## 2020-05-23 ENCOUNTER — Encounter: Payer: Self-pay | Admitting: Cardiovascular Disease

## 2020-05-23 VITALS — BP 138/62 | HR 60 | Ht 63.25 in | Wt 99.0 lb

## 2020-05-23 DIAGNOSIS — I341 Nonrheumatic mitral (valve) prolapse: Secondary | ICD-10-CM | POA: Diagnosis not present

## 2020-05-23 DIAGNOSIS — I5189 Other ill-defined heart diseases: Secondary | ICD-10-CM

## 2020-05-23 DIAGNOSIS — I340 Nonrheumatic mitral (valve) insufficiency: Secondary | ICD-10-CM

## 2020-05-23 MED ORDER — IRBESARTAN 150 MG PO TABS: ORAL_TABLET | ORAL | 3 refills | Status: DC

## 2020-05-23 MED ORDER — AMOXICILLIN 500 MG PO CAPS: 2000.0000 mg | ORAL_CAPSULE | ORAL | 5 refills | Status: DC

## 2020-05-23 NOTE — Patient Instructions (Signed)
Medication Instructions:  Your physician recommends that you continue on your current medications as directed. Please refer to the Current Medication list given to you today.  *If you need a refill on your cardiac medications before your next appointment, please call your pharmacy*   Testing/Procedures: Your physician has requested that you have a cardiac MRI. Cardiac MRI uses a computer to create images of your heart as its beating, producing both still and moving pictures of your heart and major blood vessels. For further information please visit [email or web address] Please follow the instruction sheet given to you today for more information.    Follow-Up: At [facility], you and your health needs are our priority.  As part of our continuing mission to provide you with exceptional heart care, we have created designated Provider Care Teams.  These Care Teams include your primary Cardiologist (physician) and Advanced Practice Providers (APPs -  Physician Assistants and Nurse Practitioners) who all work together to provide you with the care you need, when you need it.  We recommend signing up for the patient portal called "MyChart".  Sign up information is provided on this After Visit Summary.  MyChart is used to connect with patients for Virtual Visits (Telemedicine).  Patients are able to view lab/test results, encounter notes, upcoming appointments, etc.  Non-urgent messages can be sent to your provider as well.   To learn more about what you can do with MyChart, go to [email or web address].    Your next appointment:   3 month(s)  The format for your next appointment:   In Person  Provider:   Quay Burow, MD

## 2020-05-23 NOTE — Progress Notes (Signed)
05/23/2020 [name]   05-23-1945  [id number]  Primary Physician Dhivianathan, Candida Peeling, MD Primary Cardiologist: Lorretta Harp MD FACP, [place], [place], [place]  HPI:  Brenda Cobb is a 75 y.o.  thin and fit-appearing, married Caucasian female with no children who I last saw in the office 05/22/2019.She has a history of bileaflet severe mitral valve prolapse with regurgitation which she is totally asymptomatic from. She also has mild hyperlipidemia. Her recent 2D echo performed January 07, 2012, revealed normal LV size and function with mild concentric LVH, mildly dilated left atrium of 42 mm. Her echo really has not changed significantly since it was done prior to that.since I saw her a year ago she is completely asymptomatic.Recent 2-D echocardiogram performed 3/4/2021showed severe bileaflet prolapse with moderate to severe MR and basal septal hypertrophy with severe left atrial enlargementunchanged from her prior echo.  She just got a new puppy, Springer spaniel, and [place] several miles at a time without limitation or symptoms.  Since I saw her a year ago she continues to do well.  She still walks 3 miles a day 5 days a week without symptoms with her springer spaniel.  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   Current Meds  Medication Sig  . amoxicillin (AMOXIL) 500 MG capsule Take 4 capsules (2,000 mg total) by mouth as directed. Take 1 hour prior to dental appointments.  Marland Kitchen aspirin 81 MG tablet Take 1 tablet (81 mg total) by mouth daily.  . irbesartan (AVAPRO) 150 MG tablet TAKE 1 TABLET(150 MG) BY MOUTH DAILY  . levothyroxine (SYNTHROID) 75 MCG tablet Take 75 mcg by mouth daily before breakfast.  . Multiple Vitamin (MULTIVITAMIN) tablet Take 1 tablet by mouth daily.  Marland Kitchen zolpidem (AMBIEN) 10 MG tablet Take  0.25 mg by mouth at bedtime as needed for sleep.     No Known Allergies  Social History   Socioeconomic History  . Marital status: Married    Spouse name: Not on file  . Number of children: Not on file  . Years of education: Not on file  . Highest education level: Not on file  Occupational History  . Not on file  Tobacco Use  . Smoking status: Never Smoker  . Smokeless tobacco: Never Used  Vaping Use  . Vaping Use: Never used  Substance and Sexual Activity  . Alcohol use: Yes    Alcohol/week: 4.0 standard drinks    Types: 4 Standard drinks or equivalent per week  . Drug use: No  . Sexual activity: Never    Birth control/protection: Post-menopausal    Comment: 1st intercourse 75 yo-Fewer than 5 partners  Other Topics Concern  . Not on file  Social History Narrative  . Not on file   Social Determinants of Health   Financial Resource Strain: Not on file  Food Insecurity: Not on file  Transportation Needs: Not on file  Physical Activity: Not on file  Stress: Not on file  Social Connections: Not on file  Intimate Partner Violence: Not on file     Review of Systems: General: negative for chills, fever, night sweats or weight changes.  Cardiovascular: negative for chest pain, dyspnea on exertion, edema, orthopnea, palpitations, paroxysmal nocturnal dyspnea or shortness of breath Dermatological: negative for rash Respiratory: negative for cough or wheezing  Urologic: negative for hematuria Abdominal: negative for nausea, vomiting, diarrhea, bright red blood per rectum, melena, or hematemesis Neurologic: negative for visual changes, syncope, or dizziness All other systems reviewed and are otherwise negative except as noted above.    Blood pressure 138/62, pulse 60, height 5' 3.25" (1.607 m), weight 99 lb (44.9 kg).  General appearance: alert and no distress Neck: no adenopathy, no carotid bruit, no JVD, supple, symmetrical, trachea midline and thyroid not enlarged,  symmetric, no tenderness/mass/nodules Lungs: clear to auscultation bilaterally Heart: 3/6 apical systolic murmur with late systolic click and a tumor plop consistent with mitral valve prolapse. Extremities: extremities normal, atraumatic, no cyanosis or edema Pulses: 2+ and symmetric Skin: Skin color, texture, turgor normal. No rashes or lesions Neurologic: Alert and oriented X 3, normal strength and tone. Normal symmetric reflexes. Normal coordination and gait  EKG normal sinus rhythm at 60 without ST or T wave changes.  I personally reviewed this EKG.  ASSESSMENT AND PLAN:   MVP (mitral valve prolapse) History of severe bileaflet mitral valve prolapse with severe MR which we have been following on annual basis by 2D echo.  She is completely asymptomatic.  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  Her LV size and function are normal.  Her left atrium is severely dilated.  The uncle to get a cardiac MRI to further evaluate her myxoma and refer her to Dr. Roxy Manns for surgical consultation.      Lorretta Harp MD FACP,FACC,FAHA, [facility] 05/23/2020 9:42 AM

## 2020-05-23 NOTE — Assessment & Plan Note (Signed)
History of severe bileaflet mitral valve prolapse with severe MR which we have been following on annual basis by 2D echo.  She is completely asymptomatic.  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  Her LV size and function are normal.  Her left atrium is severely dilated.  The uncle to get a cardiac MRI to further evaluate her myxoma and refer her to Dr. Roxy Manns for surgical consultation.

## 2020-05-27 ENCOUNTER — Telehealth: Payer: Self-pay | Admitting: Cardiovascular Disease

## 2020-05-27 ENCOUNTER — Encounter: Payer: Self-pay | Admitting: Cardiovascular Disease

## 2020-05-27 NOTE — Telephone Encounter (Signed)
Spoke with patient regarding the Wednesday 07/02/20 12:00pm Cardiac MRI appointment at [facility]---arrival time is 11;30 am--1st floor admissions office Will mail information and it is available in My Chart.  Patient voiced her understanding.

## 2020-05-29 ENCOUNTER — Ambulatory Visit
Admission: RE | Admit: 2020-05-29 | Discharge: 2020-05-29 | Disposition: A | Discharge status: Home / Self Care | Payer: Medicare Other | Source: Ambulatory Visit | Attending: Obstetrics and Gynecology | Admitting: Obstetrics and Gynecology

## 2020-05-29 DIAGNOSIS — R1900 Intra-abdominal and pelvic swelling, mass and lump, unspecified site: Secondary | ICD-10-CM

## 2020-05-29 IMAGING — MR MR PELVIS WO/W CM
18 series · 48 of 48 positions shown · IV contrast (8 ML MULTIHANCE)
Comparison: Pelvic sonogram from [DATE]
COMPARISON: Pelvic sonogram from [DATE]

Addendum:
CLINICAL DATA: Pelvic mass.

EXAM:
MRI PELVIS WITHOUT AND WITH CONTRAST
TECHNIQUE: Multiplanar multisequence MR imaging of the pelvis was performed
both before and after administration of intravenous contrast.
CONTRAST:  9mL MULTIHANCE GADOBENATE DIMEGLUMINE 529 MG/ML IV SOLN

[Series 3: cor haste · coronal · 6.0mm · 0.74mm/px · 1 of 24 slices shown]
[im 1/24]
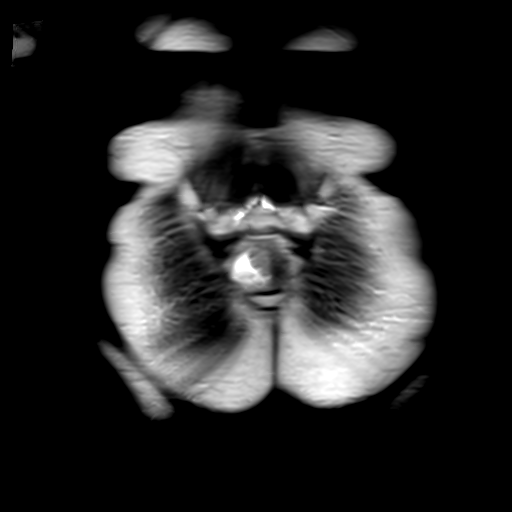

[Series 4: T2 · axial · 5.0mm · 0.94mm/px · 1 of 35 slices shown]
[im 1/35]
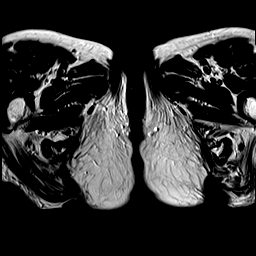

[Series 5: t2_tse axial fs · axial · 5.0mm · 0.94mm/px · 1 of 35 slices shown]
[im 1/35]
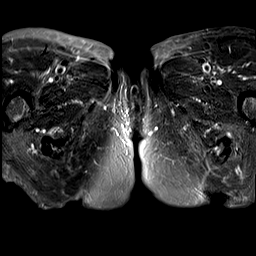

[Series 6: t2_tse_sag · sagittal · 5.0mm · 0.94mm/px · 1 of 25 slices shown]
[im 1/25]
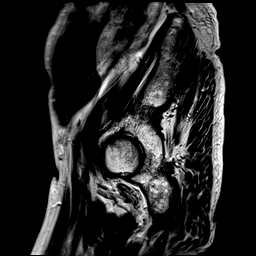

[Series 7: T1 · axial · 5.0mm · 0.55mm/px · z∈[-95,+103]mm · 2 of 74 slices shown]
[im 1/74]
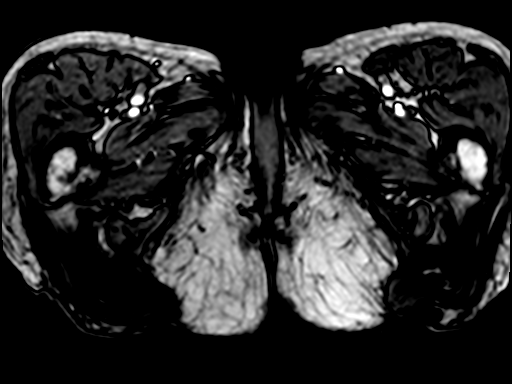
[im 74/74]
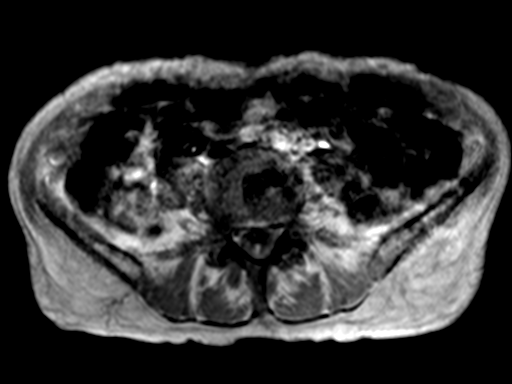

[Series 8: DWI · axial · 6.0mm · 1.82mm/px · z∈[-108,+115]mm · 3 of 96 slices shown]
[im 1/96]
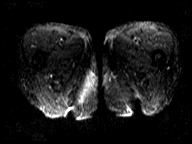
[im 48/96]
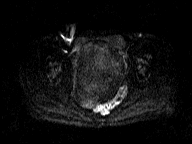
[im 96/96]
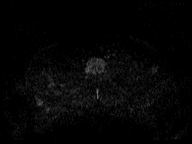

[Series 9: axial dwi_adc · axial · 6.0mm · 1.82mm/px · 1 of 32 slices shown]
[im 1/32]
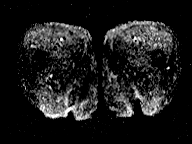

[Series 10: T1 dynamic · axial · non-contrast · 2.3mm · 1.37mm/px · z∈[-97,+103]mm · 3 of 88 slices shown]
[im 1/88]
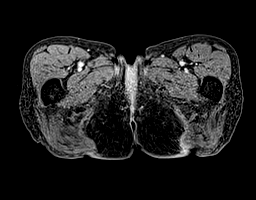
[im 44/88]
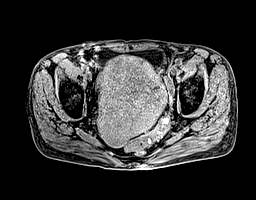
[im 88/88]
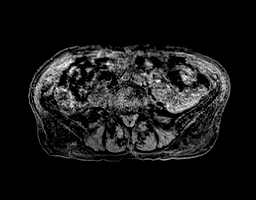

[Series 11: ax post dynamic · axial · 2.3mm · 1.37mm/px · z∈[-97,+103]mm · 4 of 88 slices shown (1 of 2)]
[im 1/88]
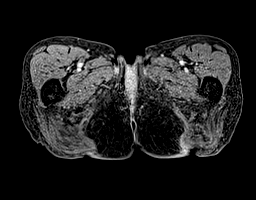
[im 30/88]
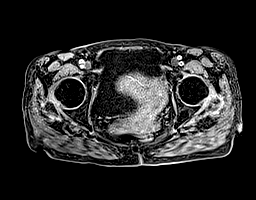
[im 59/88]
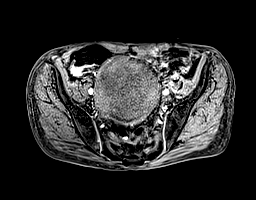
[im 88/88]
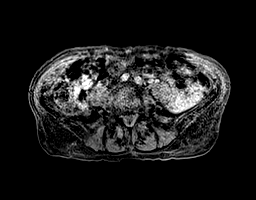

[Series 12: ax post dynamic · axial · 2.3mm · 1.37mm/px · z∈[-97,+103]mm · 4 of 88 slices shown (2 of 2)]
[im 1/88]
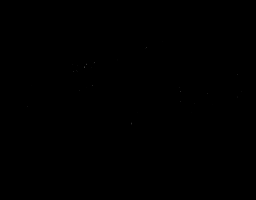
[im 30/88]
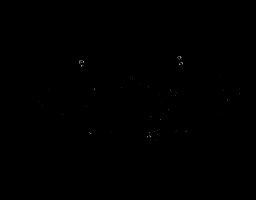
[im 59/88]
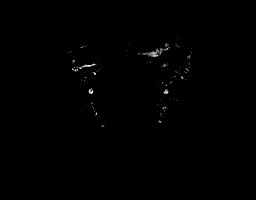
[im 88/88]
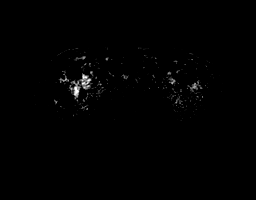

[Series 13: ax post 45 · axial · 2.3mm · 1.37mm/px · z∈[-97,+103]mm · 4 of 88 slices shown (1 of 2)]
[im 1/88]
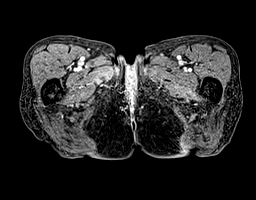
[im 30/88]
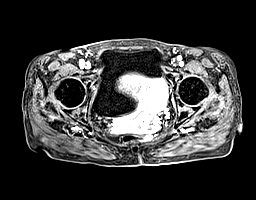
[im 59/88]
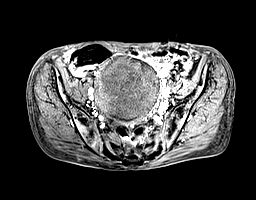
[im 88/88]
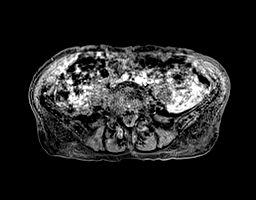

[Series 14: ax post 45 · axial · 2.3mm · 1.37mm/px · z∈[-97,+103]mm · 4 of 88 slices shown (2 of 2)]
[im 1/88]
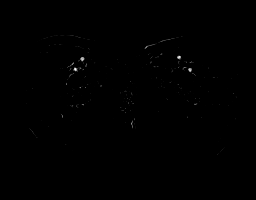
[im 30/88]
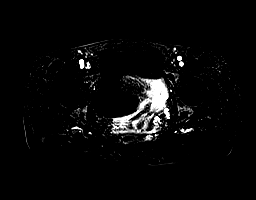
[im 59/88]
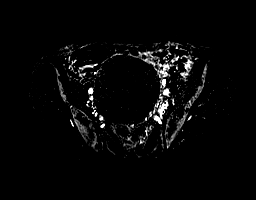
[im 88/88]
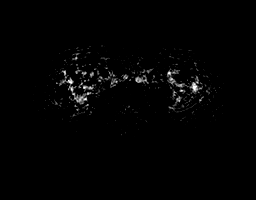

[Series 15: ax post 90 · axial · 2.3mm · 1.37mm/px · z∈[-97,+103]mm · 4 of 88 slices shown (1 of 2)]
[im 1/88]
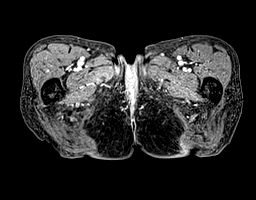
[im 30/88]
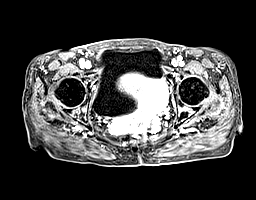
[im 59/88]
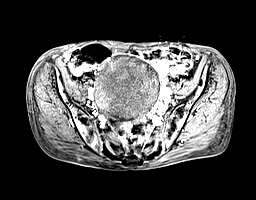
[im 88/88]
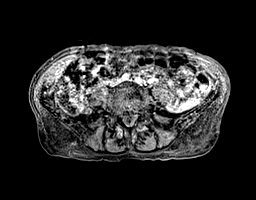

[Series 16: ax post 90 · axial · 2.3mm · 1.37mm/px · z∈[-97,+103]mm · 4 of 88 slices shown (2 of 2)]
[im 1/88]
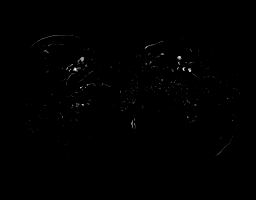
[im 30/88]
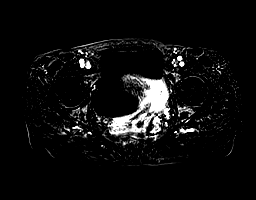
[im 59/88]
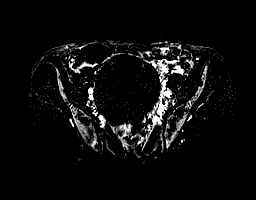
[im 88/88]
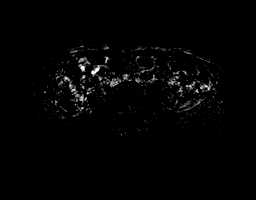

[Series 17: T1 dynamic post-contrast · sagittal · 2.5mm · 0.68mm/px · 2 of 56 slices shown]
[im 1/56]
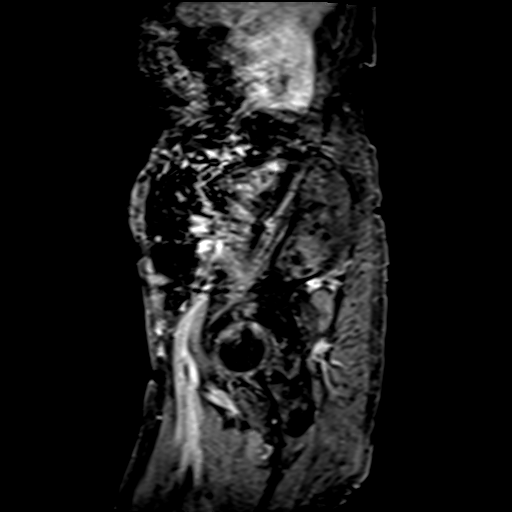
[im 56/56]
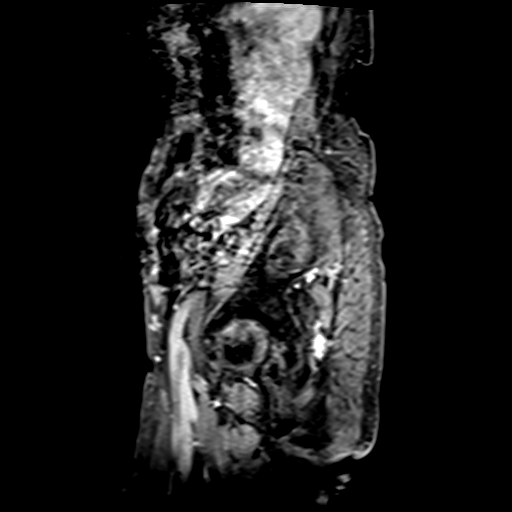

[Series 18: ax post 3+ · axial · 2.3mm · 1.37mm/px · z∈[-97,+103]mm · 4 of 88 slices shown (1 of 2)]
[im 1/88]
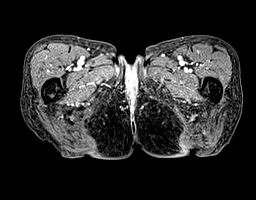
[im 30/88]
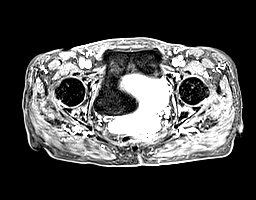
[im 59/88]
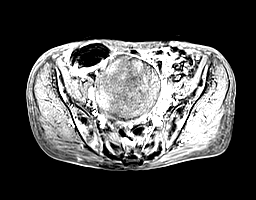
[im 88/88]
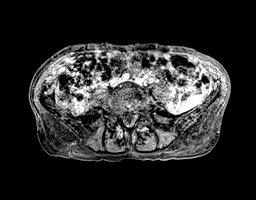

[Series 19: ax post 3+ · axial · 2.3mm · 1.37mm/px · z∈[-97,+103]mm · 4 of 88 slices shown (2 of 2)]
[im 1/88]
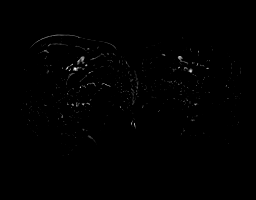
[im 30/88]
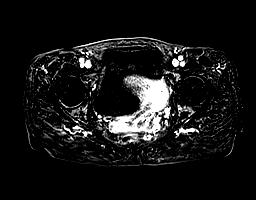
[im 59/88]
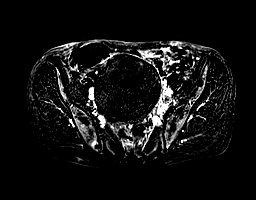
[im 88/88]
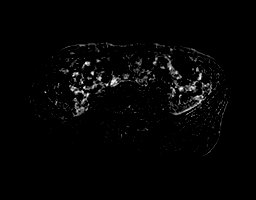

[Series 20: sag t2_post · sagittal · 5.0mm · 0.94mm/px · 1 of 25 slices shown]
[im 1/25]
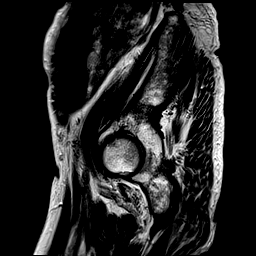

[48 of 48 positions shown; findings below may reference images not displayed]

FINDINGS: Urinary Tract: Urinary bladder is normal. No distal ureteral
dilation.

Bowel:  Colonic diverticulosis.

Vascular/Lymphatic: Vascular structures in the pelvis are patent.
Engorgement of LEFT ovarian vein and parametrial vessels. No sign of
adenopathy in pelvis.

Reproductive:

Uterus: Uterus is displaced in the pelvis from RIGHT to LEFT and is
along the LEFT pelvic sidewall.

Endometrial stripe approximately 5-6 mm.

Large mass in the pelvis shows markedly heterogeneous T2 signal,
predominantly increased T2 signal and is found in the RIGHT pelvis
displacing the uterus.w T1 signal at baseline homogeneously low.
There is little if any internal enhancement. This area measures
x 10.0 cm in the axial plane and is much as 12.5 cm in the sagittal
plane. A normal LEFT ovary is not definitively identified.

Other:  Small volume pelvic ascites.

Musculoskeletal: Heterogeneous marrow signal without suspicious bone
lesion.
IMPRESSION: 1. Large mass in the RIGHT pelvis not clearly arising from the
uterus but displacing the uterus from RIGHT to LEFT. Constellation
of findings would favor and adnexal mass with little if any
enhancement. Signal characteristics raising the question of stromal
edema, leading differential consideration is a chronically torsed
RIGHT ovary with edema and infarction with small volume ascites.
Fibrous tumor of the ovary and other low-grade ovarian neoplasms are
considered given the lack of appreciable enhancement that is present
on the current study. These could even be present with concomitant
torsion given constellation of findings on the current study. Gyn
consultation is suggested for further evaluation.
2. Mild thickening of the endometrium greater than 5 mm. Correlate
with any abnormal bleeding with gyn consultation and sampling as
warranted if there is abnormal uterine bleeding.
3. Small volume pelvic ascites.
4. Engorgement of LEFT ovarian vein and parametrial vessels.

Call is out to the referring provider to further discuss findings in
the above case.

ADDENDUM:
The presence of this large RIGHT pelvic mass was relayed to the
provider on-call as outlined below. Infarcted chronically torsed
RIGHT ovary, infarcted pedunculated leiomyoma are the leading
differential considerations. Ovarian neoplasm given lack of blood
flow is felt less likely but not entirely excluded. The patient may
be returning for thinner section images through the pelvis in both
the axial and coronal planes.

These results were called by telephone at the time of interpretation
on [DATE] at [DATE] to provider BAZAN , who
verbally acknowledged these results.

*** End of Addendum ***
FINDINGS: Urinary Tract: Urinary bladder is normal. No distal ureteral
dilation.

Bowel:  Colonic diverticulosis.

Vascular/Lymphatic: Vascular structures in the pelvis are patent.
Engorgement of LEFT ovarian vein and parametrial vessels. No sign of
adenopathy in pelvis.

Reproductive:

Uterus: Uterus is displaced in the pelvis from RIGHT to LEFT and is
along the LEFT pelvic sidewall.

Endometrial stripe approximately 5-6 mm.

Large mass in the pelvis shows markedly heterogeneous T2 signal,
predominantly increased T2 signal and is found in the RIGHT pelvis
displacing the uterus.w T1 signal at baseline homogeneously low.
There is little if any internal enhancement. This area measures
x 10.0 cm in the axial plane and is much as 12.5 cm in the sagittal
plane. A normal LEFT ovary is not definitively identified.

Other:  Small volume pelvic ascites.

Musculoskeletal: Heterogeneous marrow signal without suspicious bone
lesion.
IMPRESSION: 1. Large mass in the RIGHT pelvis not clearly arising from the
uterus but displacing the uterus from RIGHT to LEFT. Constellation
of findings would favor and adnexal mass with little if any
enhancement. Signal characteristics raising the question of stromal
edema, leading differential consideration is a chronically torsed
RIGHT ovary with edema and infarction with small volume ascites.
Fibrous tumor of the ovary and other low-grade ovarian neoplasms are
considered given the lack of appreciable enhancement that is present
on the current study. These could even be present with concomitant
torsion given constellation of findings on the current study. Gyn
consultation is suggested for further evaluation.
2. Mild thickening of the endometrium greater than 5 mm. Correlate
with any abnormal bleeding with gyn consultation and sampling as
warranted if there is abnormal uterine bleeding.
3. Small volume pelvic ascites.
4. Engorgement of LEFT ovarian vein and parametrial vessels.

Call is out to the referring provider to further discuss findings in
the above case.

## 2020-05-29 MED ORDER — GADOBENATE DIMEGLUMINE 529 MG/ML IV SOLN
9.0000 mL | Freq: Once | INTRAVENOUS | Status: AC | PRN
  Administered 2020-05-29: 9 mL via INTRAVENOUS

## 2020-05-30 ENCOUNTER — Telehealth: Payer: Self-pay | Admitting: *Deleted

## 2020-05-30 ENCOUNTER — Other Ambulatory Visit: Payer: Self-pay | Admitting: Obstetrics and Gynecology

## 2020-05-30 DIAGNOSIS — R1900 Intra-abdominal and pelvic swelling, mass and lump, unspecified site: Secondary | ICD-10-CM

## 2020-05-30 NOTE — Telephone Encounter (Signed)
Pt called with questions regarding the impression on her MRI scan. She did not understand the result. Spoke with Dr. Quincy Simmonds she will follow up with me to give patient a better understanding of findings.

## 2020-05-30 NOTE — Telephone Encounter (Signed)
Pt has scheduled appt to talk with Dr.Lavoie to discuss management

## 2020-05-30 NOTE — Telephone Encounter (Signed)
-----   Message from Princess Bruins, MD sent at 05/30/2020  8:57 AM EDT ----- Regarding: MRI Pelvic mass I received a call from the radiologist last evening.  Patient has a large pelvic mass.  Please call her to see how she is doing and tell her that I got the results.  Schedule an appointment with me early next week to discuss management.  She was Dr Scarlette Ar patient.

## 2020-06-01 ENCOUNTER — Other Ambulatory Visit: Payer: Medicare Other

## 2020-06-01 IMAGING — MR MR PELVIS WO/W CM
2 series · 16 of 48 positions shown · IV contrast (multihance)
Comparison: Pelvic sonogram from [DATE]
COMPARISON: Pelvic sonogram from [DATE]

Addendum:
CLINICAL DATA: Pelvic mass.

EXAM:
MRI PELVIS WITHOUT AND WITH CONTRAST
TECHNIQUE: Multiplanar multisequence MR imaging of the pelvis was performed
both before and after administration of intravenous contrast.
CONTRAST:  9mL MULTIHANCE GADOBENATE DIMEGLUMINE 529 MG/ML IV SOLN

[Series 6: T2 · coronal · 3.0mm · 0.39mm/px · 11 of 43 slices shown]
[im 2/43]
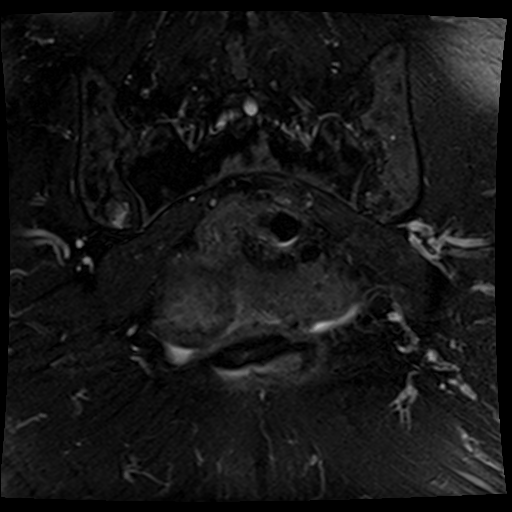
[im 6/43]
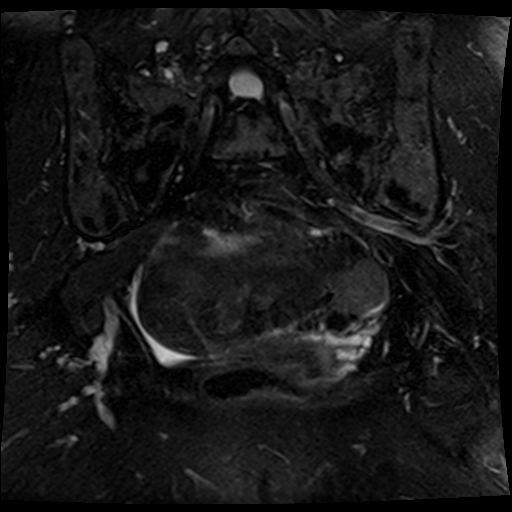
[im 8/43]
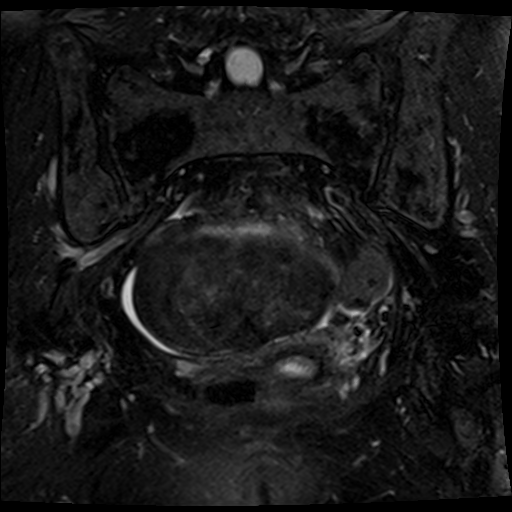
[im 14/43]
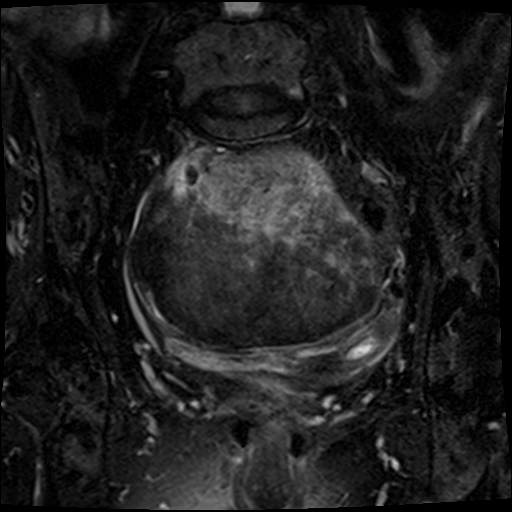
[im 20/43]
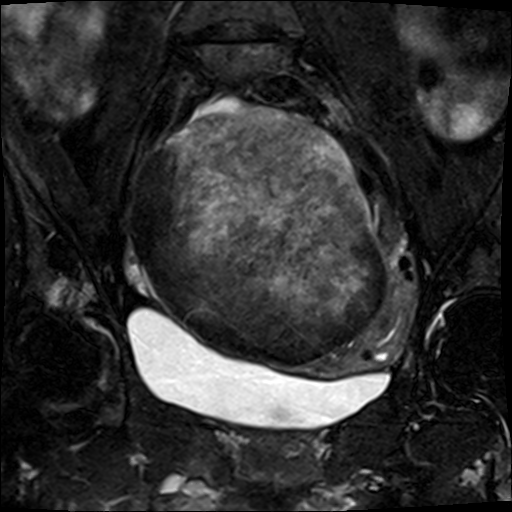
[im 22/43]
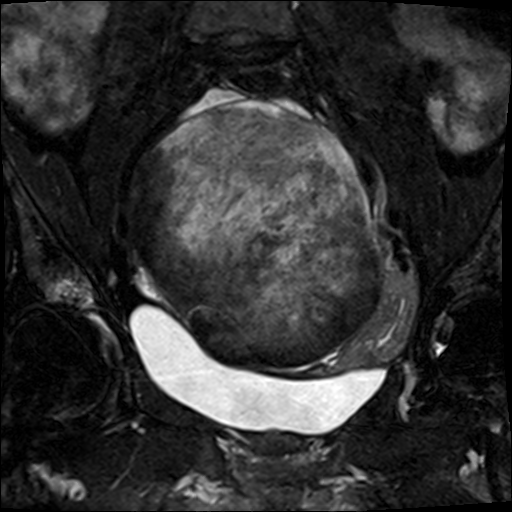
[im 23/43]
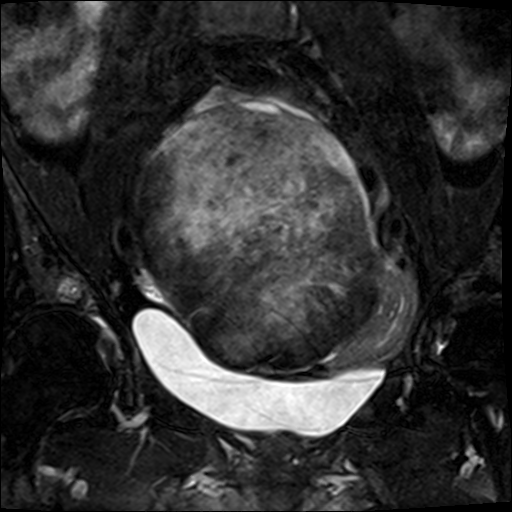
[im 29/43]
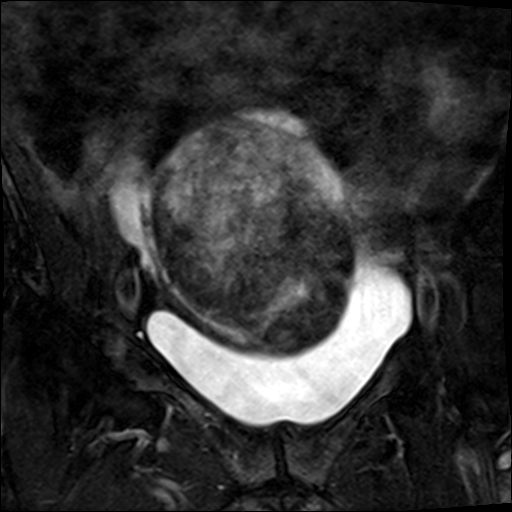
[im 35/43]
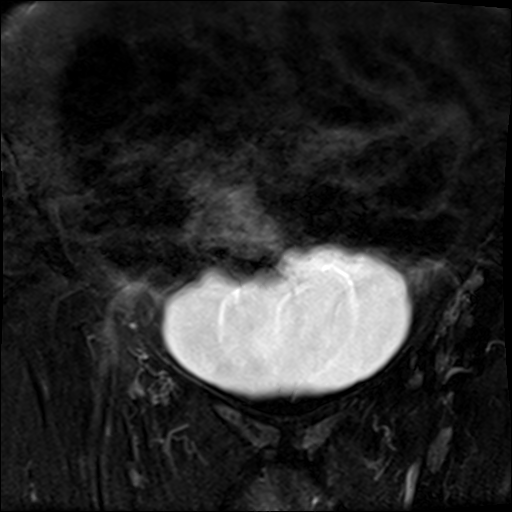
[im 37/43]
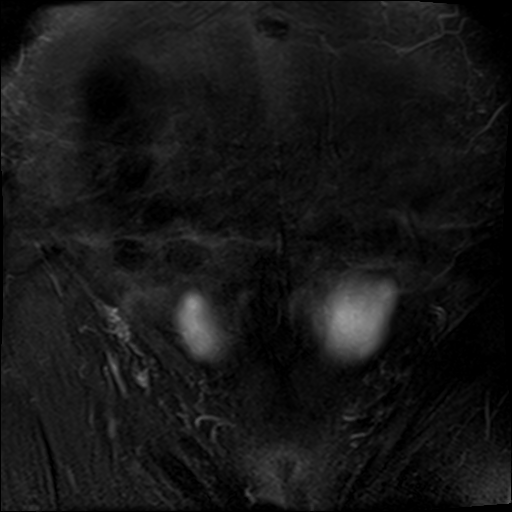
[im 41/43]
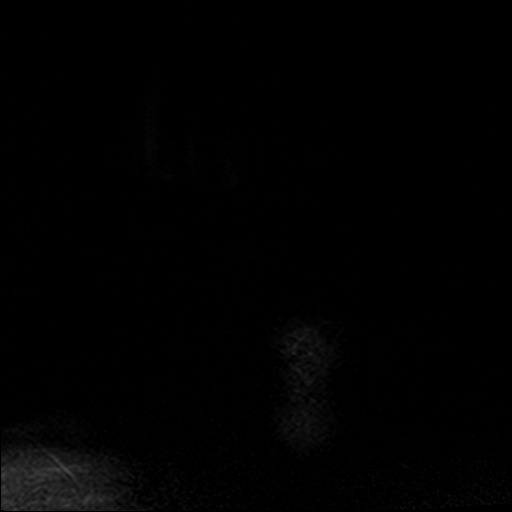

[Series 7: t2_tse_cor · coronal · 3.0mm · 0.39mm/px · 5 of 45 slices shown]
[im 2/45]
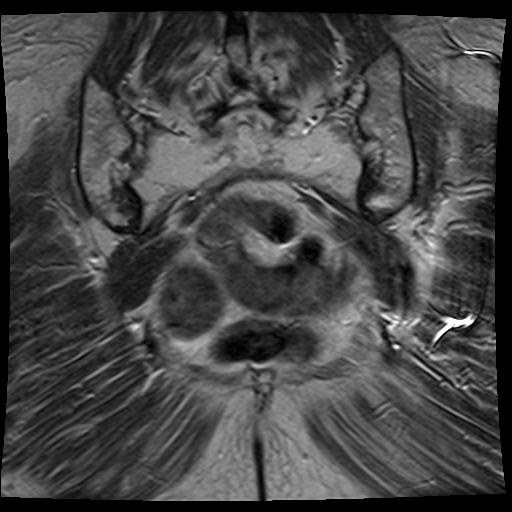
[im 8/45]
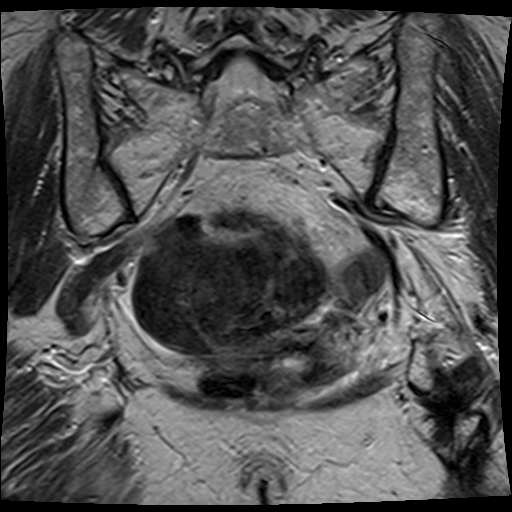
[im 13/45]
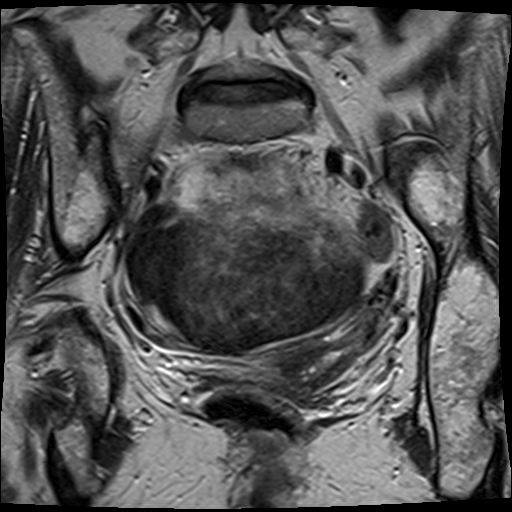
[im 23/45]
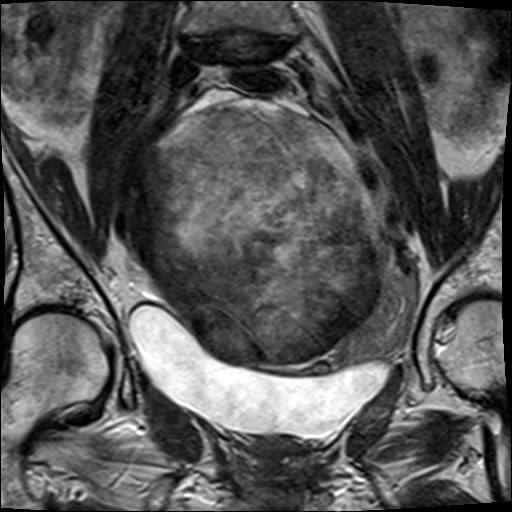
[im 37/45]
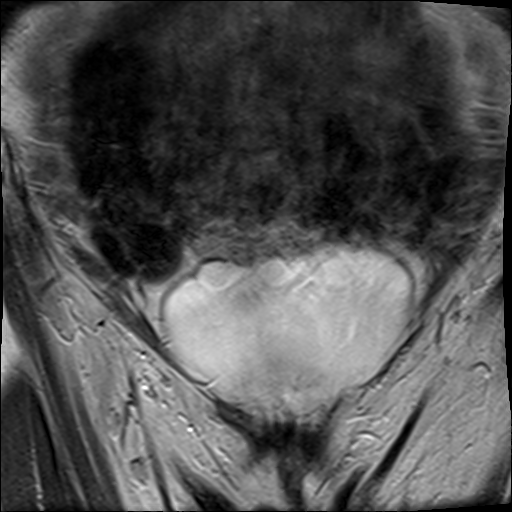

[16 of 48 positions shown; findings below may reference images not displayed]

FINDINGS: Urinary Tract: Urinary bladder is normal. No distal ureteral
dilation.

Bowel:  Colonic diverticulosis.

Vascular/Lymphatic: Vascular structures in the pelvis are patent.
Engorgement of LEFT ovarian vein and parametrial vessels. No sign of
adenopathy in pelvis.

Reproductive:

Uterus: Uterus is displaced in the pelvis from RIGHT to LEFT and is
along the LEFT pelvic sidewall.

Endometrial stripe approximately 5-6 mm.

Large mass in the pelvis shows markedly heterogeneous T2 signal,
predominantly increased T2 signal and is found in the RIGHT pelvis
displacing the uterus.w T1 signal at baseline homogeneously low.
There is little if any internal enhancement. This area measures
x 10.0 cm in the axial plane and is much as 12.5 cm in the sagittal
plane. A normal LEFT ovary is not definitively identified.

Other:  Small volume pelvic ascites.

Musculoskeletal: Heterogeneous marrow signal without suspicious bone
lesion.
IMPRESSION: 1. Large mass in the RIGHT pelvis not clearly arising from the
uterus but displacing the uterus from RIGHT to LEFT. Constellation
of findings would favor and adnexal mass with little if any
enhancement. Signal characteristics raising the question of stromal
edema, leading differential consideration is a chronically torsed
RIGHT ovary with edema and infarction with small volume ascites.
Fibrous tumor of the ovary and other low-grade ovarian neoplasms are
considered given the lack of appreciable enhancement that is present
on the current study. These could even be present with concomitant
torsion given constellation of findings on the current study. Gyn
consultation is suggested for further evaluation.
2. Mild thickening of the endometrium greater than 5 mm. Correlate
with any abnormal bleeding with gyn consultation and sampling as
warranted if there is abnormal uterine bleeding.
3. Small volume pelvic ascites.
4. Engorgement of LEFT ovarian vein and parametrial vessels.

Call is out to the referring provider to further discuss findings in
the above case.

ADDENDUM:
The presence of this large RIGHT pelvic mass was relayed to the
provider on-call as outlined below. Infarcted chronically torsed
RIGHT ovary, infarcted pedunculated leiomyoma are the leading
differential considerations. Ovarian neoplasm given lack of blood
flow is felt less likely but not entirely excluded. The patient may
be returning for thinner section images through the pelvis in both
the axial and coronal planes.

These results were called by telephone at the time of interpretation
on [DATE] at [DATE] to provider BAZAN , who
verbally acknowledged these results.

*** End of Addendum ***
FINDINGS: Urinary Tract: Urinary bladder is normal. No distal ureteral
dilation.

Bowel:  Colonic diverticulosis.

Vascular/Lymphatic: Vascular structures in the pelvis are patent.
Engorgement of LEFT ovarian vein and parametrial vessels. No sign of
adenopathy in pelvis.

Reproductive:

Uterus: Uterus is displaced in the pelvis from RIGHT to LEFT and is
along the LEFT pelvic sidewall.

Endometrial stripe approximately 5-6 mm.

Large mass in the pelvis shows markedly heterogeneous T2 signal,
predominantly increased T2 signal and is found in the RIGHT pelvis
displacing the uterus.w T1 signal at baseline homogeneously low.
There is little if any internal enhancement. This area measures
x 10.0 cm in the axial plane and is much as 12.5 cm in the sagittal
plane. A normal LEFT ovary is not definitively identified.

Other:  Small volume pelvic ascites.

Musculoskeletal: Heterogeneous marrow signal without suspicious bone
lesion.
IMPRESSION: 1. Large mass in the RIGHT pelvis not clearly arising from the
uterus but displacing the uterus from RIGHT to LEFT. Constellation
of findings would favor and adnexal mass with little if any
enhancement. Signal characteristics raising the question of stromal
edema, leading differential consideration is a chronically torsed
RIGHT ovary with edema and infarction with small volume ascites.
Fibrous tumor of the ovary and other low-grade ovarian neoplasms are
considered given the lack of appreciable enhancement that is present
on the current study. These could even be present with concomitant
torsion given constellation of findings on the current study. Gyn
consultation is suggested for further evaluation.
2. Mild thickening of the endometrium greater than 5 mm. Correlate
with any abnormal bleeding with gyn consultation and sampling as
warranted if there is abnormal uterine bleeding.
3. Small volume pelvic ascites.
4. Engorgement of LEFT ovarian vein and parametrial vessels.

Call is out to the referring provider to further discuss findings in
the above case.

## 2020-06-06 ENCOUNTER — Other Ambulatory Visit: Payer: Self-pay

## 2020-06-06 ENCOUNTER — Encounter: Payer: Self-pay | Admitting: Obstetrics & Gynecology

## 2020-06-06 ENCOUNTER — Ambulatory Visit (INDEPENDENT_AMBULATORY_CARE_PROVIDER_SITE_OTHER): Payer: Medicare Other | Admitting: Obstetrics & Gynecology

## 2020-06-06 VITALS — BP 140/84

## 2020-06-06 DIAGNOSIS — R1900 Intra-abdominal and pelvic swelling, mass and lump, unspecified site: Secondary | ICD-10-CM | POA: Diagnosis not present

## 2020-06-06 DIAGNOSIS — I341 Nonrheumatic mitral (valve) prolapse: Secondary | ICD-10-CM

## 2020-06-06 NOTE — Progress Notes (Addendum)
ERISA MEHLMAN 1945-06-05 [id number]        74 y.o.  G0  RP: Counseling on Right pelvic mass by MRI  HPI: Completely asymptomatic.  Mitral valve prolapse, planning surgery in 06/2020.   OB History  Gravida Para Term Preterm AB Living  0            SAB IAB Ectopic Multiple Live Births               Past medical history,surgical history, problem list, medications, allergies, family history and social history were all reviewed and documented in the EPIC chart.   Directed ROS with pertinent positives and negatives documented in the history of present illness/assessment and plan.  Exam:  Vitals:   06/06/20 1111  BP: 140/84   General appearance:  Normal  MRI of pelvis 05/29/2020:   Reproductive:   Uterus: Uterus is displaced in the pelvis from RIGHT to LEFT and is along the LEFT pelvic sidewall.   Endometrial stripe approximately 5-6 mm.   Large mass in the pelvis shows markedly heterogeneous T2 signal, predominantly increased T2 signal and is found in the RIGHT pelvis displacing the uterus.w T1 signal at baseline homogeneously low. There is little if any internal enhancement. This area measures 11.9 x 10.0 cm in the axial plane and is much as 12.5 cm in the sagittal plane. A normal LEFT ovary is not definitively identified.   Other:  Small volume pelvic ascites.   Musculoskeletal: Heterogeneous marrow signal without suspicious bone lesion.   IMPRESSION: 1. Large mass in the RIGHT pelvis not clearly arising from the uterus but displacing the uterus from RIGHT to LEFT. Constellation of findings would favor and adnexal mass with little if any enhancement. Signal characteristics raising the question of stromal edema, leading differential consideration is a chronically torsed RIGHT ovary with edema and infarction with small volume ascites. Fibrous tumor of the ovary and other low-grade ovarian neoplasms are considered given the lack of appreciable enhancement that  is present on the current study. These could even be present with concomitant torsion given constellation of findings on the current study. Gyn consultation is suggested for further evaluation. 2. Mild thickening of the endometrium greater than 5 mm. Correlate with any abnormal bleeding with gyn consultation and sampling as warranted if there is abnormal uterine bleeding. 3. Small volume pelvic ascites. 4. 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 G0P0   1. Rt Pelvic mass Large heterogeneous right pelvic mass measuring 11.9 x 10 x 12.5 cm displacing the uterus to the left.  The mass is avascular, favoring a benign process.  Possible old right ovarian torsion on a benign cyst versus a necrotic pedunculated uterine fibroid.  Small amount of pelvic fluid.  A malignant process not completely ruled out.  We will do tumor markers today including LDH, CA-125 and CEA.  Given that patient is completely asymptomatic and planning a mitral valve repair in April 2022, the decision was made to repeat a pelvic ultrasound after the mitral valve repair to confirm stability if the tumor markers are negative and proceed with excision of the mass with me if the ultrasound is showing stability.  If any indication of a possible malignant process is present, the patient will be referred to [facility] for further management. - US Transvaginal Non-OB; Future - Lactate Dehydrogenase (LDH) - CA 125 - CEA  2. MVP (mitral valve prolapse) Planning surgical repair in 06/2020.  Princess Bruins MD,  11:58 AM 06/06/2020

## 2020-06-10 LAB — CEA: CEA: 2.5 ng/mL — ABNORMAL HIGH

## 2020-06-10 LAB — CA 125: CA 125: 27 U/mL (ref <35)

## 2020-06-10 LAB — LACTATE DEHYDROGENASE: LDH: 156 U/L (ref 120–250)

## 2020-06-11 ENCOUNTER — Encounter: Payer: Self-pay | Admitting: Obstetrics & Gynecology

## 2020-06-13 ENCOUNTER — Telehealth: Payer: Self-pay | Admitting: *Deleted

## 2020-06-13 NOTE — Telephone Encounter (Signed)
Patient informed with below note, will send staff message to [facility] to schedule.

## 2020-06-13 NOTE — Telephone Encounter (Signed)
-----   Message from Princess Bruins, MD sent at 06/13/2020  3:19 PM EDT ----- Ca 125 wnl at 27.  CEA mildly increased at 2.5.  Given those results, please refer to [facility] now for counseling and management of the large Pelvic mass.  Patient is scheduled soon for a Mitral Valve Repair.

## 2020-06-16 ENCOUNTER — Telehealth: Payer: Self-pay | Admitting: *Deleted

## 2020-06-16 NOTE — Telephone Encounter (Addendum)
Spoke with the patient and scheduled a new patient appt for 4/14 at 11:15am. Patient given an arrival time of 11:15am. Patient given the address and phone number for the clinic. Patient also given the policy for mask and visitors. Patient offered earlier appt date, patient requested a day that she would already be in [place] for other appts

## 2020-06-17 NOTE — Telephone Encounter (Signed)
Patient scheduled on 06/26/20 with Dr.Tucker.

## 2020-06-17 NOTE — Telephone Encounter (Signed)
Brenda Cobb received referral and is working on scheduling.

## 2020-06-24 ENCOUNTER — Ambulatory Visit (INDEPENDENT_AMBULATORY_CARE_PROVIDER_SITE_OTHER): Payer: Medicare Other

## 2020-06-24 ENCOUNTER — Other Ambulatory Visit: Payer: Self-pay | Admitting: Obstetrics and Gynecology

## 2020-06-24 ENCOUNTER — Other Ambulatory Visit: Payer: Self-pay

## 2020-06-24 DIAGNOSIS — Z780 Asymptomatic menopausal state: Secondary | ICD-10-CM

## 2020-06-24 DIAGNOSIS — M8589 Other specified disorders of bone density and structure, multiple sites: Secondary | ICD-10-CM

## 2020-06-24 DIAGNOSIS — M8588 Other specified disorders of bone density and structure, other site: Secondary | ICD-10-CM

## 2020-06-24 DIAGNOSIS — M8580 Other specified disorders of bone density and structure, unspecified site: Secondary | ICD-10-CM

## 2020-06-24 DIAGNOSIS — Z01411 Encounter for gynecological examination (general) (routine) with abnormal findings: Secondary | ICD-10-CM

## 2020-06-25 ENCOUNTER — Encounter: Payer: Self-pay | Admitting: Gynecologic Oncology

## 2020-06-26 ENCOUNTER — Inpatient Hospital Stay: Payer: Medicare Other | Attending: Gynecologic Oncology | Admitting: Gynecologic Oncology

## 2020-06-26 ENCOUNTER — Encounter: Payer: Self-pay | Admitting: Gynecologic Oncology

## 2020-06-26 ENCOUNTER — Other Ambulatory Visit: Payer: Self-pay

## 2020-06-26 VITALS — BP 154/74 | HR 74 | Temp 96.9°F | Resp 18 | Ht 64.0 in | Wt 103.0 lb

## 2020-06-26 DIAGNOSIS — R970 Elevated carcinoembryonic antigen [CEA]: Secondary | ICD-10-CM | POA: Diagnosis not present

## 2020-06-26 DIAGNOSIS — Z1211 Encounter for screening for malignant neoplasm of colon: Secondary | ICD-10-CM

## 2020-06-26 DIAGNOSIS — Z79899 Other long term (current) drug therapy: Secondary | ICD-10-CM | POA: Insufficient documentation

## 2020-06-26 DIAGNOSIS — D398 Neoplasm of uncertain behavior of other specified female genital organs: Secondary | ICD-10-CM | POA: Insufficient documentation

## 2020-06-26 DIAGNOSIS — Z7982 Long term (current) use of aspirin: Secondary | ICD-10-CM | POA: Diagnosis not present

## 2020-06-26 DIAGNOSIS — I341 Nonrheumatic mitral (valve) prolapse: Secondary | ICD-10-CM | POA: Diagnosis not present

## 2020-06-26 NOTE — Progress Notes (Signed)
GYNECOLOGIC ONCOLOGY NEW PATIENT CONSULTATION   Patient Name: Brenda Cobb  Patient Age: 75 y.o. Date of Service: 06/26/20 Referring Provider: Princess Bruins MD  Primary Care Provider: Mikey Bussing Candida Peeling, MD Consulting Provider: Jeral Pinch, MD   Assessment/Plan:  Postmenopausal patient with an asymptomatic complex adnexal mass.  We discussed ultrasound and MRI findings.  While pathologic review is the only way to rule out malignancy, based on imaging findings, my suspicion that this is a malignancy is low.  MRI favors either a pedunculated fibroid or a chronically torsed ovary.  While her tumor markers are overall reassuring, we discussed the limitations of tumor markers.  CA-125 and CA 19-9 are within normal limits.  We discussed that CA-125 can be elevated in many benign causes and normal and up to 50% of early stage ovarian cancers.  Her CEA is just above the upper limits of normal.  While this may be related to her pelvic mass, it could also be unrelated.  She has not had a colonoscopy in more than 10 years and has not had any other colon cancer screening.  While she is asymptomatic from a GI standpoint, I recommended updating her colon cancer screening given the CEA value.  Referral was placed today.  In terms of following this mass, while she would be a candidate for surgery, I feel that her cardiac issues take precedence.  I recommend that we follow with interval imaging.  The patient is already scheduled for repeat pelvic ultrasound at her gynecologist office in June.  I will have a phone visit with her several days after to discuss results.  The mass continues to be asymptomatic and does not change in size or character, we can continue to watch it with imaging surveillance.  That imaging surveillance could be spaced out depending on future ultrasound results.  If the patient ultimately undergoes mitral valve repair and is deemed to be an acceptable surgical candidate at some  point in the future, we can revisit the discussion of surgical intervention.  All the patient's questions and concerns were answered today.  I sent a note both to her cardiologist and the cardiothoracic surgeon with whom she has an upcoming appointment to let them know that from my standpoint, I think malignancy is unlikely and that her cardiac issues should take precedence.  A copy of this note was sent to the patient's referring provider.   60 minutes of total time was spent for this patient encounter, including preparation, face-to-face counseling with the patient and coordination of care, and documentation of the encounter.  Jeral Pinch, MD  Division of Gynecologic Oncology  Department of Obstetrics and Gynecology  [facility]  ___________________________________________  Chief Complaint: No chief complaint on file.   History of Present Illness:  Brenda Cobb is a 75 y.o. y.o. female who is seen in consultation at the request of Dr. Dellis Filbert for an evaluation of an adnexal mass.  Patient reports overall feeling very well.  She denies any abdominal pain, pelvic pressure, or urinary symptoms.  She has normal bowel function although was constipated for some of last week which she thinks may have been related to anxiety.  She notes that her bowels are back to normal now and she does not need to use any medications for bowel function.  She endorses having a good appetite without any nausea, emesis, early satiety, or bloating.  She was noted to have a pelvic mass on routine exam earlier this year.  Pelvic ultrasound and MRI have been done with results noted below.  Additionally, she has now had tumor markers with normal CA-125, 19 9, and a very mildly elevated CEA.  Her medical history is notable for mitral valve prolapse.  Given recent findings, patient has been recommended to undergo a cardiac MRI which she is having done on the 20th and a cardiothoracic  surgery consult on the 25th with Dr. Roxy Manns.  It sounds like she may ultimately be recommended to undergo mitral valve repair.  Patient lives in [place] with her 2 dogs.  She reports occasional alcohol use and denies any tobacco use.  She is retired.  She walks 3 miles a day.  She denies any shortness of breath or chest pain.  PAST MEDICAL HISTORY:  Past Medical History:  Diagnosis Date  . Hyperlipidemia   . Mitral valve disorder    2D ECHO, 01/07/2012 - EF >55%, LA severely dilated, moderate mitral valve prolapse, moderate-severe regurgitation, requires antibiotic prophylaxis  . Osteopenia 04/2017   T score -1.2 FRAX 7% / 0.9%  . Pelvic mass   . Thyroid disease    Hyperthyroid--Graves dis     PAST SURGICAL HISTORY:  Past Surgical History:  Procedure Laterality Date  . BREAST BIOPSY     Benign  . BREAST SURGERY     Benign breast cyst    OB/GYN HISTORY:  OB History  Gravida Para Term Preterm AB Living  0            SAB IAB Ectopic Multiple Live Births               No LMP recorded. Patient is postmenopausal.  Age at menarche: 6 Age at menopause: 31-41 Hx of HRT: Denies Hx of STDs: Denies Last pap: 04/2020 - negative  History of abnormal pap smears: Denies  SCREENING STUDIES:  Last mammogram: 2021  Last colonoscopy: More than 10 years ago Last bone mineral density: 2022  MEDICATIONS: Outpatient Encounter Medications as of 06/26/2020  Medication Sig  . aspirin 81 MG tablet Take 1 tablet (81 mg total) by mouth daily.  . irbesartan (AVAPRO) 150 MG tablet TAKE 1 TABLET(150 MG) BY MOUTH DAILY  . levothyroxine (SYNTHROID) 75 MCG tablet Take 88 mcg by mouth daily before breakfast.  . Multiple Vitamin (MULTIVITAMIN) tablet Take 1 tablet by mouth daily.  Marland Kitchen zolpidem (AMBIEN) 10 MG tablet Take 0.25 mg by mouth at bedtime as needed for sleep.   No facility-administered encounter medications on file as of 06/26/2020.    ALLERGIES:  No Known Allergies   FAMILY HISTORY:   Family History  Problem Relation Age of Onset  . Alzheimer's disease Father   . Aneurysm Brother   . ALS Maternal Grandmother   . Parkinson's disease Maternal Grandfather   . Breast cancer Paternal Grandmother 95  . Breast cancer Maternal Aunt 50     SOCIAL HISTORY:  Social Connections: Not on file    REVIEW OF SYSTEMS:  Denies appetite changes, fevers, chills, fatigue, unexplained weight changes. Denies hearing loss, neck lumps or masses, mouth sores, ringing in ears or voice changes. Denies cough or wheezing.  Denies shortness of breath. Denies chest pain or palpitations. Denies leg swelling. Denies abdominal distention, pain, blood in stools, constipation, diarrhea, nausea, vomiting, or early satiety. Denies pain with intercourse, dysuria, frequency, hematuria or incontinence. Denies hot flashes, pelvic pain, vaginal bleeding or vaginal discharge.   Denies joint pain, back pain or muscle pain/cramps. Denies itching, rash, or wounds. Denies dizziness,  headaches, numbness or seizures. Denies swollen lymph nodes or glands, denies easy bruising or bleeding. Denies anxiety, depression, confusion, or decreased concentration.  Physical Exam:  Vital Signs for this encounter:  Blood pressure (!) 154/74, pulse 74, temperature (!) 96.9 F (36.1 C), temperature source Tympanic, resp. rate 18, height 5\' 4"  (1.626 m), weight 103 lb (46.7 kg), SpO2 100 %. Body mass index is 17.68 kg/m. General: Alert, oriented, no acute distress.  HEENT: Normocephalic, atraumatic. Sclera anicteric.  Chest: Clear to auscultation bilaterally. No wheezes, rhonchi, or rales. Cardiovascular: Regular rate and rhythm, holosystolic murmur, no rubs. Abdomen: Normoactive bowel sounds. Soft, nondistended, nontender to palpation.  Fullness appreciated in the low pelvis. No palpable fluid wave.  Extremities: Grossly normal range of motion. Warm, well perfused. No edema bilaterally.  Skin: No rashes or lesions.   Lymphatics: No cervical, supraclavicular, or inguinal adenopathy.  GU: Normal external female genitalia.  On speculum exam, mild atrophy of the vaginal mucosa, no lesions or masses noted.  Cervix within normal limits although deviated to the left.  On bimanual exam there is a smooth mass filling much of the pelvis up to 3-4 cm inferior to umbilicus.  The mass is mobile in conjunction with the uterus, moves freely from the sidewalls.  No nodularity appreciated.  Mass palpable with abdominal hand on bimanual and rectovaginal exam.  LABORATORY AND RADIOLOGIC DATA:  Outside medical records were reviewed to synthesize the above history, along with the history and physical obtained during the visit.   No results found for: WBC, HGB, HCT, PLT, GLUCOSE, CHOL, TRIG, HDL, LDLDIRECT, LDLCALC, ALT, AST, NA, K, CL, CREATININE, BUN, CO2, TSH, PSA, INR, GLUF, HGBA1C, MICROALBUR  Pelvic ultrasound 05/15/20: Anteverted enlarged uterus 7.4 x 9.8 cm with pelvic mass 9.9 x 11.6 mm associated with the lower uterine segment inferiorlyand to the right, resulting in uterus being deviated tothe left pelvis. Symmetrical endometrium approximately 4 mm but difficult to visualize due to distortion from the fibroid. Normal bilateral ovaries. No adnexal masses. No pelvic free fluid.  MRI pelvis on 05/29/20:  1. Large mass in the RIGHT pelvis not clearly arising from the uterus but displacing the uterus from RIGHT to LEFT. Constellation of findings would favor and adnexal mass with little if any enhancement. Signal characteristics raising the question of stromal edema, leading differential consideration is a chronically torsed RIGHT ovary with edema and infarction with small volume ascites. Fibrous tumor of the ovary and other low-grade ovarian neoplasms are considered given the lack of appreciable enhancement that is present on the current study. These could even be present with concomitant torsion given constellation of  findings on the current study. Gyn consultation is suggested for further evaluation. 2. Mild thickening of the endometrium greater than 5 mm. Correlate with any abnormal bleeding with gyn consultation and sampling as warranted if there is abnormal uterine bleeding. 3. Small volume pelvic ascites. 4. 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 Addendum: The presence of this large RIGHT pelvic mass was relayed to the provider on-call as outlined below. Infarcted chronically torsed RIGHT ovary, infarcted pedunculated leiomyoma are the leading differential considerations. Ovarian neoplasm given lack of blood flow is felt less likely but not entirely excluded. The patient may be returning for thinner section images through the pelvis in both the axial and coronal planes.  Tumor markers: CEA: 2.5 CA-125: 27 LDH: 156

## 2020-06-26 NOTE — Patient Instructions (Addendum)
Proceed with your ultrasound with Dr. Dellis Filbert and we will have a phone visit with Dr. Berline Lopes shortly after.  We are placing a referral for a screening colonoscopy with [facility]. They should be contacting you to arrange for an appointment.  Financial controller GI Contact [place], [place]: [phone] Fax: [phone]

## 2020-06-30 ENCOUNTER — Telehealth (HOSPITAL_COMMUNITY): Payer: Self-pay | Admitting: Emergency Medicine

## 2020-06-30 NOTE — Telephone Encounter (Signed)
Attempted to call patient regarding upcoming cardiac MR appointment. Left message on voicemail with name and callback number Deakin Lacek RN Navigator Cardiac Imaging [facility] [phone] Office [phone] Cell  

## 2020-07-02 ENCOUNTER — Other Ambulatory Visit: Payer: Self-pay

## 2020-07-02 ENCOUNTER — Ambulatory Visit (HOSPITAL_COMMUNITY)
Admission: RE | Admit: 2020-07-02 | Discharge: 2020-07-02 | Disposition: A | Discharge status: Home / Self Care | Payer: Medicare Other | Source: Ambulatory Visit | Attending: Cardiovascular Disease | Admitting: Cardiovascular Disease

## 2020-07-02 DIAGNOSIS — I340 Nonrheumatic mitral (valve) insufficiency: Secondary | ICD-10-CM | POA: Diagnosis not present

## 2020-07-02 DIAGNOSIS — I5189 Other ill-defined heart diseases: Secondary | ICD-10-CM | POA: Diagnosis not present

## 2020-07-02 DIAGNOSIS — I341 Nonrheumatic mitral (valve) prolapse: Secondary | ICD-10-CM | POA: Diagnosis present

## 2020-07-02 IMAGING — MR MR CARD MORPHOLOGY WO/W CM
45 of 48 series · 45 of 48 positions shown · IV contrast (Contrast agent)
Comparison: TTE [DATE]

EXAM:
CARDIAC MRI

CLINICAL DATA: Mitral annular disjunction with severe mitral valve
prolapse, ? left atrial myxoma
TECHNIQUE: The patient was scanned on a 1.5 Tesla GE magnet. A dedicated
cardiac coil was used. Functional imaging was done using Fiesta
sequences. [DATE], and 4 chamber views were done to assess for RWMA's.
Modified WILMAROS rule using a short axis stack was used to
calculate an ejection fraction on a dedicated work station using
Circle software. The patient received 5mL GADAVIST GADOBUTROL 1
MMOL/ML IV SOLN. After 10 minutes inversion recovery sequences were
used to assess for infiltration and scar tissue.

[Series 39: bSSFP · oblique · 8.0mm · 1.52mm/px · 1 of 25 slices shown (1 of 19)]
[im 1/25]
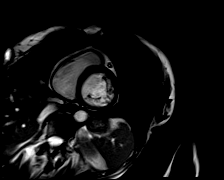

[Series 39: bSSFP · oblique · 8.0mm · 1.52mm/px · 1 of 25 slices shown (2 of 19)]
[im 1/25]
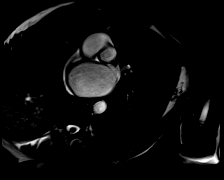

[Series 39: bSSFP · oblique · 8.0mm · 1.52mm/px · 1 of 25 slices shown (3 of 19)]
[im 1/25]
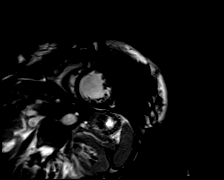

[Series 39: bSSFP · oblique · 8.0mm · 1.52mm/px · 1 of 25 slices shown (4 of 19)]
[im 1/25]
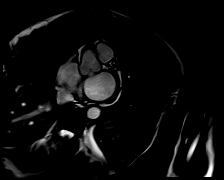

[Series 39: bSSFP · oblique · 8.0mm · 1.52mm/px · 1 of 25 slices shown (5 of 19)]
[im 1/25]
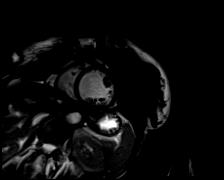

[Series 39: bSSFP · oblique · 8.0mm · 1.52mm/px · 1 of 25 slices shown (6 of 19)]
[im 1/25]
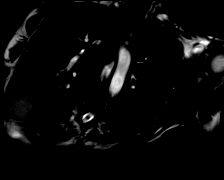

[Series 39: bSSFP · oblique · 8.0mm · 1.52mm/px · 1 of 25 slices shown (7 of 19)]
[im 1/25]
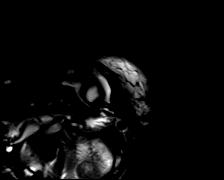

[Series 39: bSSFP · oblique · 8.0mm · 1.52mm/px · 1 of 25 slices shown (8 of 19)]
[im 1/25]
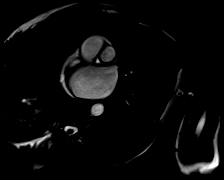

[Series 39: bSSFP · oblique · 8.0mm · 1.52mm/px · 1 of 25 slices shown (9 of 19)]
[im 1/25]
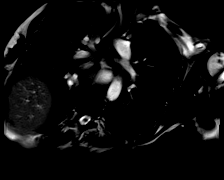

[Series 39: bSSFP · oblique · 8.0mm · 1.52mm/px · 1 of 25 slices shown (10 of 19)]
[im 1/25]
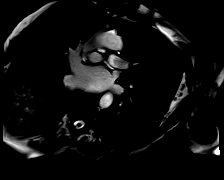

[Series 39: bSSFP · oblique · 8.0mm · 1.52mm/px · 1 of 25 slices shown (11 of 19)]
[im 1/25]
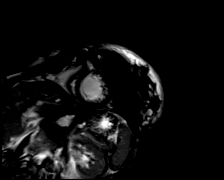

[Series 39: bSSFP · oblique · 8.0mm · 1.52mm/px · 1 of 25 slices shown (12 of 19)]
[im 1/25]
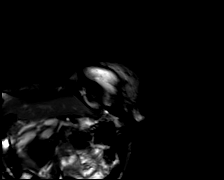

[Series 39: bSSFP · oblique · 8.0mm · 1.52mm/px · 1 of 25 slices shown (13 of 19)]
[im 1/25]
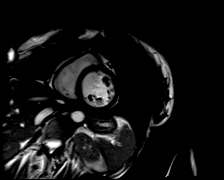

[Series 39: bSSFP · oblique · 8.0mm · 1.52mm/px · 1 of 25 slices shown (14 of 19)]
[im 1/25]
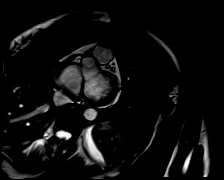

[Series 39: bSSFP · oblique · 8.0mm · 1.52mm/px · 1 of 25 slices shown (15 of 19)]
[im 1/25]
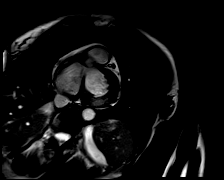

[Series 39: bSSFP · oblique · 8.0mm · 1.52mm/px · 1 of 25 slices shown (16 of 19)]
[im 1/25]
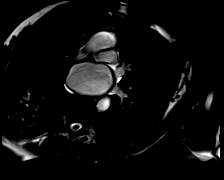

[Series 40: long axis stack · oblique · 6.0mm · 1.52mm/px · 1 of 25 slices shown (1 of 11)]
[im 1/25]
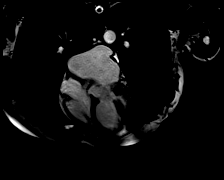

[Series 40: long axis stack · oblique · 6.0mm · 1.52mm/px · 1 of 25 slices shown (2 of 11)]
[im 1/25]
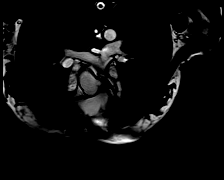

[Series 40: long axis stack · oblique · 6.0mm · 1.52mm/px · 1 of 25 slices shown (3 of 11)]
[im 1/25]
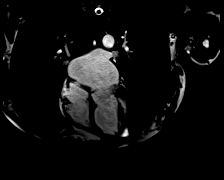

[Series 40: long axis stack · oblique · 6.0mm · 1.52mm/px · 1 of 25 slices shown (4 of 11)]
[im 1/25]
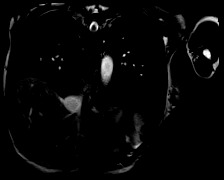

[Series 40: long axis stack · oblique · 6.0mm · 1.52mm/px · 1 of 25 slices shown (5 of 11)]
[im 1/25]
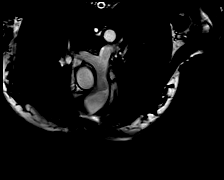

[Series 40: long axis stack · oblique · 6.0mm · 1.52mm/px · 1 of 25 slices shown (6 of 11)]
[im 1/25]
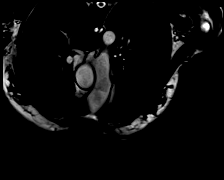

[Series 40: long axis stack · oblique · 6.0mm · 1.52mm/px · 1 of 25 slices shown (7 of 11)]
[im 1/25]
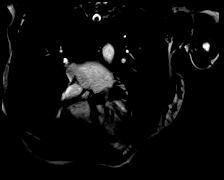

[Series 40: long axis stack · oblique · 6.0mm · 1.52mm/px · 1 of 25 slices shown (8 of 11)]
[im 1/25]
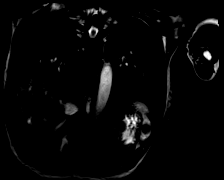

[Series 40: long axis stack · oblique · 6.0mm · 1.52mm/px · 1 of 25 slices shown (9 of 11)]
[im 1/25]
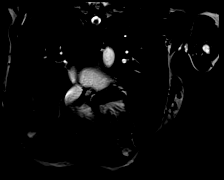

[Series 40: long axis stack · oblique · 6.0mm · 1.52mm/px · 1 of 25 slices shown (10 of 11)]
[im 1/25]
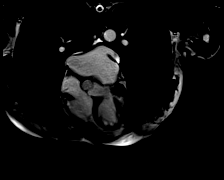

[Series 40: long axis stack · oblique · 6.0mm · 1.52mm/px · 1 of 25 slices shown (11 of 11)]
[im 1/25]
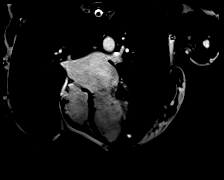

[Series 42: STIR · oblique · 8.0mm · 1.92mm/px · 1 of 20 slices shown]
[im 1/20]
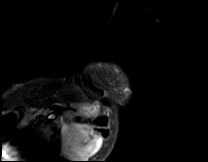

[Series 43: bSSFP · oblique · 8.0mm · 1.52mm/px · 1 of 25 slices shown (17 of 19)]
[im 1/25]
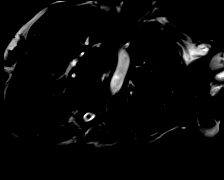

[Series 44: bSSFP · oblique · 6.0mm · 1.41mm/px · 1 of 25 slices shown (18 of 19)]
[im 1/25]
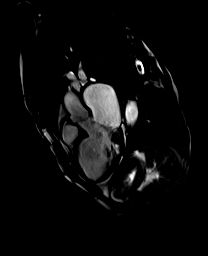

[Series 45: bSSFP · coronal · 6.0mm · 1.41mm/px · 1 of 25 slices shown (19 of 19)]
[im 1/25]
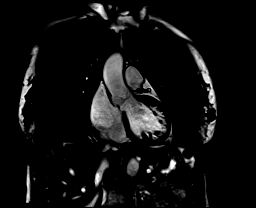

[Series 46: av thins · oblique · 5.0mm · 1.52mm/px · 1 of 25 slices shown (1 of 9)]
[im 1/25]
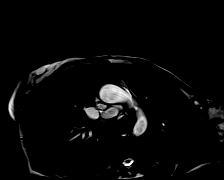

[Series 46: av thins · oblique · 5.0mm · 1.52mm/px · 1 of 25 slices shown (2 of 9)]
[im 1/25]
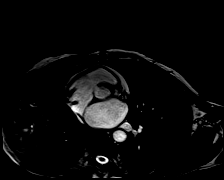

[Series 46: av thins · oblique · 5.0mm · 1.52mm/px · 1 of 25 slices shown (3 of 9)]
[im 1/25]
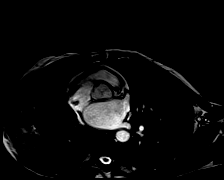

[Series 46: av thins · oblique · 5.0mm · 1.52mm/px · 1 of 25 slices shown (4 of 9)]
[im 1/25]
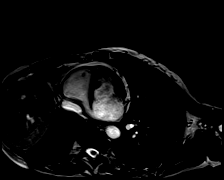

[Series 46: av thins · oblique · 5.0mm · 1.52mm/px · 1 of 25 slices shown (5 of 9)]
[im 1/25]
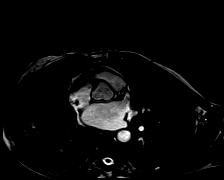

[Series 46: av thins · oblique · 5.0mm · 1.52mm/px · 1 of 25 slices shown (6 of 9)]
[im 1/25]
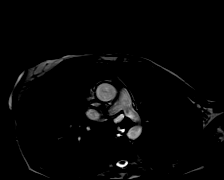

[Series 46: av thins · oblique · 5.0mm · 1.52mm/px · 1 of 25 slices shown (7 of 9)]
[im 1/25]
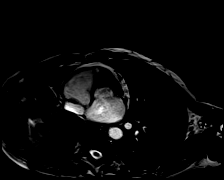

[Series 46: av thins · oblique · 5.0mm · 1.52mm/px · 1 of 25 slices shown (8 of 9)]
[im 1/25]
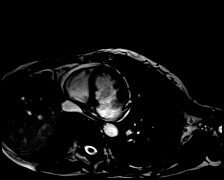

[Series 46: av thins · oblique · 5.0mm · 1.52mm/px · 1 of 25 slices shown (9 of 9)]
[im 1/25]
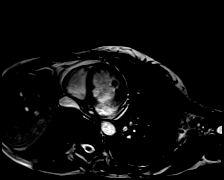

[Series 49: t1_tse_db short axis · oblique · 3.0mm · 1.32mm/px · 1 of 20 slices shown]
[im 1/20]
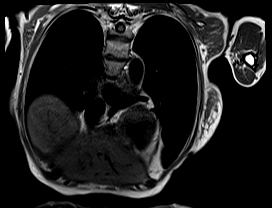

[Series 50: t1_tse_fs db short · oblique · 3.0mm · 1.32mm/px · 1 of 20 slices shown]
[im 1/20]
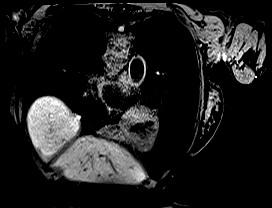

[Series 51: (id)_long_t1 · oblique · 8.0mm · 1.56mm/px · 1 of 24 slices shown]
[im 1/24]
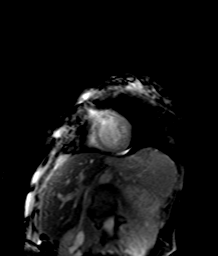

[Series 52: (id)_long_t1_moco · oblique · 8.0mm · 1.56mm/px · 1 of 24 slices shown]
[im 1/24]
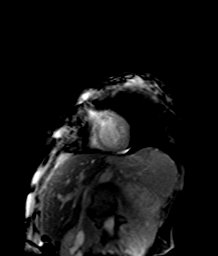

[Series 53: (id)_long_t1_moco_t1 · oblique · 8.0mm · 1.56mm/px · 1 of 6 slices shown]
[im 1/6]
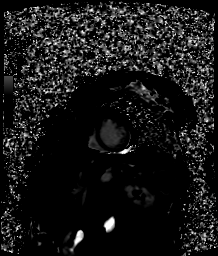

[45 of 48 positions shown; findings below may reference images not displayed]

FINDINGS: LEFT VENTRICLE:

Mildly dilated left ventricular chamber size by indexed volume.

Normal left ventricular wall thickness.

Normal left ventricular systolic function.

LVEF = 65%

There are no regional wall motion abnormalities.

No myocardial edema, T2 48 msec at base.

There is post contrast delayed myocardial enhancement in the
subendocardial basal lateral and inferior wall.

Normal T1 myocardial nulling kinetics suggest against a diagnosis of
cardiac amyloidosis.

ECV = 29% assuming a hematocrit of 40%, upper limit of normal range
vs may represent hypertensive heart disease. Patient's actual
hematocrit not available for calculation.

RIGHT VENTRICLE:

Normal right ventricular chamber size.

Normal right ventricular wall thickness.

Normal right ventricular systolic function.

RVEF = 50%

There are no regional wall motion abnormalities.

No post contrast delayed myocardial enhancement.

ATRIA:

Severe enlargement of left atrial size.

There is no apparent mass in the left atrium. Side by side
comparison performed to echocardiograms from [WX], [WX], and [WX].
Lesion in question is adjacent to the right lower pulmonary vein on
echo, however on multiple views with thin slice assessment,
perfusion, and post contrast images, no comparable lesion is seen on
MRI. Suspect this finding on echo may be secondary to artifact or
tissue plane.

Normal right atrial size.

VALVES:

Severe bileaflet mitral valve prolapse with mitral annular
disjunction. Displacement distance 9 mm.

PERICARDIUM:

Normal pericardium.  No pericardial effusion.

OTHER: No significant extracardiac findings.

MEASUREMENTS:
Qp/Qs

Left ventricle:

LV Female

LV EF: 65% (Normal 56-78%)

Absolute volumes:

LV EDV: 129mL (Normal 52-141 mL)

LV ESV: 45mL (Normal 13-51 mL)

LV SV: 84mL (Normal 33-97 mL)

CO: 4.6L/min (Normal 2.7-6.0 L/min)

Indexed volumes:

LV EDV: 89mL/sq-m (Normal 41-81 mL/sq-m)

LV ESV: 31mL/sq-m (Normal 12-21 mL/sq-m)

LV SV: 58mL/sq-m (Normal 26-56 mL/sq-m)

CI: 3.15L/min/sq-m (Normal 1.8-3.8 L/min/sq-m)

Right ventricle:

RV female

RV EF: 50% (Normal 47-80%)

Absolute volumes:

RV EDV: 90 mL (Normal 58-154 mL)

RV ESV: 45 mL (Normal 12-68 mL)

RV SV: 46 mL (Normal 35-98 mL)

CO: 2.5 L/min (Normal 2.7-6 L/min)

Indexed volumes:

RV EDV: 62 ML/sq-m (Normal 48-87 mL/sq-m)

RV ESV: 31 mL/sq-m (Normal 11-28 mL/sq-m)

RV SV: 31 mL/sq-m (Normal 27-57 mL/sq-m)

CI: 1.7 L/min/sq-m (Normal 1.8-3.8 L/min/sq-m)
IMPRESSION: 1.  No left atrial mass identified on multiple imaging planes.

2. Mildly dilated left ventricle by indexed volume with normal
systolic function, LVEF 65%. LVEDD 60 mm, LVESD 47 mm.

3.  Normal right ventricular chamber size and function, RVEF 50%.

4. Severe bileaflet mitral valve prolapse with mitral annular
disjunction. Delayed myocardial enhancement seen in the
subendocardium of the basal lateral and inferior wall adjacent to
the area of posterior mitral annular disjunction. This finding
suggests risk factor for ventricular arrhythmia.

5. Late systolic mitral valve regurgitation. Regurgitant volume at
least 37 mL, with regurgitant fraction 44%. Findings in combination
suggest at least moderate-severe mitral valve regurgitation.

## 2020-07-02 MED ORDER — GADOBUTROL 1 MMOL/ML IV SOLN
5.0000 mL | Freq: Once | INTRAVENOUS | Status: AC | PRN
  Administered 2020-07-02: 5 mL via INTRAVENOUS

## 2020-07-07 ENCOUNTER — Encounter: Payer: Medicare Other | Admitting: Thoracic Surgery (Cardiothoracic Vascular Surgery)

## 2020-07-08 ENCOUNTER — Encounter: Payer: Medicare Other | Admitting: Thoracic Surgery (Cardiothoracic Vascular Surgery)

## 2020-07-09 ENCOUNTER — Institutional Professional Consult (permissible substitution) (INDEPENDENT_AMBULATORY_CARE_PROVIDER_SITE_OTHER): Payer: Medicare Other | Admitting: Thoracic Surgery (Cardiothoracic Vascular Surgery)

## 2020-07-09 ENCOUNTER — Encounter: Payer: Self-pay | Admitting: Thoracic Surgery (Cardiothoracic Vascular Surgery)

## 2020-07-09 ENCOUNTER — Telehealth: Payer: Self-pay

## 2020-07-09 ENCOUNTER — Other Ambulatory Visit: Payer: Self-pay

## 2020-07-09 VITALS — BP 163/83 | HR 98 | Resp 20 | Ht 64.0 in | Wt 100.0 lb

## 2020-07-09 DIAGNOSIS — I340 Nonrheumatic mitral (valve) insufficiency: Secondary | ICD-10-CM

## 2020-07-09 DIAGNOSIS — I341 Nonrheumatic mitral (valve) prolapse: Secondary | ICD-10-CM | POA: Diagnosis not present

## 2020-07-09 NOTE — Telephone Encounter (Signed)
Spoke with pt regarding results from cardiac MRI. Explained to pt results. All questions answered for pt. Pt has new pt consult with Dr. Roxy Manns scheduled for today to discuss mini MVR. Pt states that she is ready to move forward with procedure and is frustrated because of her appointment with Dr. Guy Sandifer office being cancelled two times already. Will follow  Up with pt regarding OV with Dr. Roxy Manns today and go from there. Pt verbalizes understanding.

## 2020-07-09 NOTE — Progress Notes (Signed)
[address]       [place] [address]             [phone]     CARDIOTHORACIC SURGERY CONSULTATION REPORT  Referring Provider is Lorretta Harp, MD PCP is Princess Bruins, MD  Chief Complaint  Patient presents with  . Mitral Valve Prolapse    Initial surgical consult, ECHO 3/9, MR coronary 4/20    HPI:  Patient is 75 year old female with history of mitral valve prolapse and mitral regurgitation, hyperlipidemia, osteopenia, and thyroid disease who has been referred for surgical consultation to discuss treatment options for management of mitral valve prolapse with severe mitral regurgitation.  Patient has known of presence of mitral valve prolapse for more than 40 years.  She has been followed regularly by Dr. Gwenlyn Found for a long time with serial echocardiograms.  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  Cardiac gated MRI was performed and the patient was referred for surgical consultation.  Patient is widowed and lives alone in [place].  She has no children and no extended family nearby.  She remains relatively active physically for her age.  She walks 3 miles at a time nearly every day of the week with her dogs.  She does not exercise other than walking, but she reports no other significant physical limitations.  She specifically denies any symptoms of exertional shortness of breath or change in her exercise tolerance.  She has not had any symptoms of atypical chest pain, palpitations, nor any history of  dysrhythmias.  Past Medical History:  Diagnosis Date  . Hyperlipidemia   . Mitral valve disorder    2D ECHO, 01/07/2012 - EF >55%, LA severely dilated, moderate mitral valve prolapse, moderate-severe regurgitation, requires antibiotic prophylaxis  . Osteopenia 04/2017   T score -1.2 FRAX 7% / 0.9%  . Pelvic mass   . Thyroid disease    Hyperthyroid--Graves dis    Past Surgical History:  Procedure Laterality Date  . BREAST BIOPSY     Benign  . BREAST SURGERY     Benign breast cyst    Family History  Problem Relation Age of Onset  . Alzheimer's disease Father   . Aneurysm Brother   . ALS Maternal Grandmother   . Parkinson's disease Maternal Grandfather   . Breast cancer Paternal Grandmother 91  . Breast cancer Maternal Aunt 42    Social History   Socioeconomic History  . Marital status: Widowed    Spouse name: Not on file  . Number of children: Not on file  . Years of education: Not on file  . Highest education level: Not on file  Occupational History  . Not on file  Tobacco Use  . Smoking status: Never Smoker  . Smokeless tobacco: Never Used  Vaping Use  . Vaping Use: Never used  Substance and Sexual Activity  . Alcohol use: Yes    Alcohol/week: 4.0 standard drinks    Types: 4 Standard drinks or equivalent per week    Comment: socially  . Drug use: No  . Sexual activity: Never    Birth control/protection: Post-menopausal  Comment: 1st intercourse 75 yo-Fewer than 5 partners  Other Topics Concern  . Not on file  Social History Narrative  . Not on file   Social Determinants of Health   Financial Resource Strain: Not on file  Food Insecurity: Not on file  Transportation Needs: Not on file  Physical Activity: Not on file  Stress: Not on file  Social Connections: Not on file  Intimate Partner Violence: Not on file    Current Outpatient Medications  Medication Sig Dispense Refill  . aspirin 81 MG tablet Take 1 tablet (81 mg total) by mouth daily. 90  tablet 3  . irbesartan (AVAPRO) 150 MG tablet TAKE 1 TABLET(150 MG) BY MOUTH DAILY 90 tablet 3  . levothyroxine (SYNTHROID) 75 MCG tablet Take 88 mcg by mouth daily before breakfast.    . zolpidem (AMBIEN) 10 MG tablet Take 0.25 mg by mouth at bedtime as needed for sleep.     No current facility-administered medications for this visit.    No Known Allergies    Review of Systems:   General:  normal appetite, normal energy, no weight gain, no weight loss, no fever  Cardiac:  no chest pain with exertion, no chest pain at rest, no SOB with exertion, no resting SOB, no PND, no orthopnea, no palpitations, no arrhythmia, no atrial fibrillation, no LE edema, no dizzy spells, no syncope  Respiratory:  no shortness of breath, no home oxygen, no productive cough, no dry cough, no bronchitis, no wheezing, no hemoptysis, no asthma, no pain with inspiration or cough, no sleep apnea, no CPAP at night  GI:   no difficulty swallowing, no reflux, no frequent heartburn, no hiatal hernia, no abdominal pain, no constipation, no diarrhea, no hematochezia, no hematemesis, no melena  GU:   no dysuria,  non frequency, no urinary tract infection, no hematuria, no  kidney stones, no kidney disease  Vascular:  no pain suggestive of claudication, no pain in feet, no leg cramps, no varicose veins, no DVT, no non-healing foot ulcer  Neuro:   no stroke, no TIA's, no seizures, no headaches, no temporary blindness one eye,  no slurred speech, no peripheral neuropathy, no chronic pain, no instability of gait, no memory/cognitive dysfunction  Musculoskeletal: no arthritis, no joint swelling, no myalgias, no difficulty walking, normal mobility   Skin:   no rash, no itching, no skin infections, no pressure sores or ulcerations  Psych:   no anxiety, no depression, no nervousness, no unusual recent stress  Eyes:   no blurry vision, no floaters, no recent vision changes, does not wear glasses or contacts  ENT:   no hearing loss, no  loose or painful teeth, no dentures, last saw dentist Feb 2022  Hematologic:  no easy bruising, no abnormal bleeding, no clotting disorder, no frequent epistaxis  Endocrine:  no diabetes, does not check CBG's at home     Physical Exam:   BP (!) 163/83 (BP Location: Right Arm, Patient Position: Sitting)   Pulse 98   Resp 20   Ht 5\' 4"  (1.626 m)   Wt 100 lb (45.4 kg)   SpO2 100% Comment: RA  BMI 17.16 kg/m   General:  Very thin and petite,  well-appearing  HEENT:  Unremarkable   Neck:   no JVD, no bruits, no adenopathy   Chest:   clear to auscultation, symmetrical breath sounds, no wheezes, no rhonchi   CV:   RRR, grade V/VI systolic murmur   Abdomen:  soft, non-tender, no masses  Extremities:  warm, well-perfused, pulses palpabld, no LE edema  Rectal/GU  Deferred  Neuro:   Grossly non-focal and symmetrical throughout  Skin:   Clean and dry but thin and frail appearing,no rashes, no breakdown   Diagnostic Tests:   ECHOCARDIOGRAM REPORT       Patient Name:  CLEOLA BRANDLEY Date of Exam: 05/21/2020  Medical Rec #: [id number]    Height:    63.3 in  Accession #:  [id number]   Weight:    101.0 lb  Date of Birth: February 22, 1946    BSA:     1.451 m  Patient Age:  3 years    BP:      128/72 mmHg  Patient Gender: F        HR:      68 bpm.  Exam Location: [address]   Procedure: 2D Echo, Cardiac Doppler and Color Doppler   Indications:  I34.0 MR    History:    Patient has prior history of Echocardiogram examinations,  most         recent 05/17/2019. MR and Mitral Valve Prolapse.    Sonographer:  Coralyn Helling RDCS  Referring Phys: [place]    1. 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 The jet is broad based, eccentric and posteriorly directed  and therefore unable to  quantify based on  PISA. Recommend TEE vs cardiac MRI for further evaluation.  2. 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 Recommend TEE vs  cardiac MRI to further evaluation.  3. Left ventricular ejection fraction, by estimation, is 60 to 65%. The  left ventricle has normal function. The left ventricle has no regional  wall motion abnormalities. There is mild asymmetric left ventricular  hypertrophy of the basal-septal segment.  Left ventricular diastolic parameters are consistent with Grade II  diastolic dysfunction (pseudonormalization).  4. Right ventricular systolic function is normal. The right ventricular  size is normal.  5. Left atrial size was severely dilated.  6. Tricuspid valve regurgitation is mild to moderate.  7. The aortic valve is tricuspid. There is mild thickening of the aortic  valve. Aortic valve regurgitation is mild. Mild aortic valve sclerosis is  present, with no evidence of aortic valve stenosis.  8. The inferior vena cava is normal in size with greater than 50%  respiratory variability, suggesting right atrial pressure of 3 mmHg.   Comparison(s): Compared to prior echo on 05/17/19, the LA mass is better  visualized on the current study. Otherwise, there is no significant  change.   FINDINGS  Left Ventricle: Left ventricular ejection fraction, by estimation, is 60  to 65%. The left ventricle has normal function. The left ventricle has no  regional wall motion abnormalities. The left ventricular internal cavity  size was normal in size. There is  mild asymmetric left ventricular hypertrophy of the basal-septal segment.  Left ventricular diastolic parameters are consistent with Grade II  diastolic dysfunction (pseudonormalization).   Right Ventricle: The right ventricular size is normal. No increase in   right ventricular wall thickness. Right ventricular systolic function is  normal.   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 Left atrial size was  severely dilated.   Right Atrium: Right atrial size was normal in size.   Pericardium: There is no evidence of pericardial effusion.   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 The jet is broad based, eccetric and posteriorly directed  and therefore unable to quantify  based on PISA. Recommend TEE vs cardiac MRI for further interrogation. The  mitral valve is myxomatous. There is moderate thickening of the mitral  valve leaflet(s). Severe mitral valve regurgitation.   Tricuspid Valve: The tricuspid valve is normal in structure. Tricuspid  valve regurgitation is mild to moderate.   Aortic Valve: The aortic valve is tricuspid. There is mild thickening of  the aortic valve. Aortic valve regurgitation is mild. Aortic regurgitation  PHT measures 618 msec. Mild aortic valve sclerosis is present, with no  evidence of aortic valve stenosis.   Pulmonic Valve: The pulmonic valve was normal in structure. Pulmonic valve  regurgitation is trivial.   Aorta: The aortic root and ascending aorta are structurally normal, with  no evidence of dilitation.   Venous: The inferior vena cava is normal in size with greater than 50%  respiratory variability, suggesting right atrial pressure of 3 mmHg.   IAS/Shunts: There is right bowing of the interatrial septum, suggestive of  elevated left atrial pressure. No atrial level shunt detected by color  flow Doppler.     LEFT VENTRICLE  PLAX 2D  LVIDd:     3.90 cm Diastology  LVIDs:     2.80 cm LV e' medial:  6.31 cm/s   LV PW:     1.20 cm LV E/e' medial: 23.0  LV IVS:    1.30 cm LV e' lateral:  8.49 cm/s  LVOT diam:   1.80 cm LV E/e' lateral: 17.1  LV SV:     62  LV SV Index:  42  LVOT Area:   2.54 cm     RIGHT VENTRICLE       IVC  RV S prime:   12.10 cm/s IVC diam: 0.80 cm  TAPSE (M-mode): 1.6 cm  RVSP:      35.5 mmHg   LEFT ATRIUM      Index    RIGHT ATRIUM      Index  LA diam:   4.00 cm 2.76 cm/m RA Pressure: 3.00 mmHg  LA Vol (A4C): 127.0 ml 87.53 ml/m RA Area:   9.45 cm                   RA Volume:  19.70 ml 13.58 ml/m  AORTIC VALVE  LVOT Vmax:  139.00 cm/s  LVOT Vmean: 89.700 cm/s  LVOT VTI:  0.242 m  AI PHT:   618 msec    AORTA  Ao Root diam: 3.00 cm  Ao Asc diam: 2.90 cm   MV E velocity: 145.00 cm/s TRICUSPID VALVE  MV A velocity: 93.40 cm/s  TR Peak grad:  32.5 mmHg  MV E/A ratio: 1.55     TR Vmax:    285.00 cm/s               Estimated RAP: 3.00 mmHg               RVSP:      35.5 mmHg  SHUNTS               Systemic VTI: 0.24 m               Systemic Diam: 1.80 cm   Gwyndolyn Kaufman MD  Electronically signed by Gwyndolyn Kaufman MD  Signature Date/Time: 05/21/2020/3:21:17 PM       CARDIAC MRI  TECHNIQUE: The patient was scanned on a 1.5 Tesla GE magnet. A dedicated cardiac coil was used. Functional imaging was done using Fiesta sequences. 2,3, and 4 chamber views were done to assess for RWMA's. Modified Simpson's rule using a short axis stack was used to calculate an ejection fraction on a dedicated work Conservation officer, nature. The patient received 23mL GADAVIST GADOBUTROL 1 MMOL/ML IV SOLN. After 10 minutes inversion recovery sequences were used to assess for infiltration and scar tissue.  FINDINGS: LEFT VENTRICLE:  Mildly dilated left ventricular  chamber size by indexed volume.  Normal left ventricular wall thickness.  Normal left ventricular systolic function.  LVEF = 65%  There are no regional wall motion abnormalities.  No myocardial edema, T2 48 msec at base.  There is post contrast delayed myocardial enhancement in the subendocardial basal lateral and inferior wall.  Normal T1 myocardial nulling kinetics suggest against a diagnosis of cardiac amyloidosis.  ECV = 29% assuming a hematocrit of 40%, upper limit of normal range vs may represent hypertensive heart disease. Patient's actual hematocrit not available for calculation.  RIGHT VENTRICLE:  Normal right ventricular chamber size.  Normal right ventricular wall thickness.  Normal right ventricular systolic function.  RVEF = 50%  There are no regional wall motion abnormalities.  No post contrast delayed myocardial enhancement.  ATRIA:  Severe enlargement of left atrial size.  There is no apparent mass in the left atrium. Side by side comparison performed to echocardiograms from 2020, 2021, and 2022. Lesion in question is adjacent to the right lower pulmonary vein on echo, however on multiple views with thin slice assessment, perfusion, and post contrast images, no comparable lesion is seen on MRI. Suspect this finding on echo may be secondary to artifact or tissue plane.  Normal right atrial size.  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 Displacement distance 9 mm.  PERICARDIUM:  Normal pericardium.  No pericardial effusion.  OTHER: No significant extracardiac findings.  MEASUREMENTS: Qp/Qs 1.08  Left ventricle:  LV Female  LV EF: 65% (Normal 56-78%)  Absolute volumes:  LV EDV: 124mL (Normal 52-141 mL)  LV ESV: 25mL (Normal 13-51 mL)  LV SV: 14mL (Normal 33-97 mL)  CO: 4.6L/min (Normal 2.7-6.0 L/min)  Indexed volumes:  LV EDV: 41mL/sq-m (Normal  41-81 mL/sq-m)  LV ESV: 22mL/sq-m (Normal 12-21 mL/sq-m)  LV SV: 14mL/sq-m (Normal 26-56 mL/sq-m)  CI: 3.15L/min/sq-m (Normal 1.8-3.8 L/min/sq-m)  Right ventricle:  RV female  RV EF: 50% (Normal 47-80%)  Absolute volumes:  RV EDV: 90 mL (Normal 58-154 mL)  RV ESV: 45 mL (Normal 12-68 mL)  RV SV: 46 mL (Normal 35-98 mL)  CO: 2.5 L/min (Normal 2.7-6 L/min)  Indexed volumes:  RV EDV: 62 ML/sq-m (Normal 48-87 mL/sq-m)  RV ESV: 31 mL/sq-m (Normal 11-28 mL/sq-m)  RV SV: 31 mL/sq-m (Normal 27-57 mL/sq-m)  CI: 1.7 L/min/sq-m (Normal 1.8-3.8 L/min/sq-m)  IMPRESSION: 1.  No left atrial mass identified on multiple imaging planes.  2. Mildly dilated left ventricle by indexed volume with normal systolic function, LVEF 52%. LVEDD 60 mm, LVESD 47 mm.  3.  Normal right ventricular  chamber size and function, RVEF 50%.  4. 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 Delayed myocardial enhancement seen in the subendocardium of the basal lateral and inferior wall adjacent to the area of posterior mitral annular disjunction. This finding suggests risk factor for ventricular arrhythmia.  5. Late systolic mitral valve regurgitation. Regurgitant volume at least 37 mL, with regurgitant fraction 44%. Findings in combination suggest at least moderate-severe mitral valve regurgitation.   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  I have personally reviewed the patient's most recent transthoracic echocardiogram and compared it with the last 2 previous follow-up echocardiograms performed over the last few years.  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  The jet of regurgitation is not completely holosystolic and is directed slightly  posteriorly.  It appears to be severe.  There are no obvious flail segments.  There is significant mitral annular disjunction.  There is severe left atrial enlargement.  Cardiac gated MRI confirms the presence of bileaflet prolapse with late systolic mitral regurgitation and severe left atrial enlargement.  There was no sign of any mass in the left atrium or interatrial septum as had been queried at the time of the patient's most recent transthoracic echocardiogram.  Options include continued close follow-up with serial echocardiography versus elective mitral valve repair.  Risks associated with elective valve repair should be relatively low but nonetheless slightly elevated because of the patient's age and extremely small physical stature.  Moreover, valve repair may be somewhat complex, although transesophageal echocardiography would be helpful to better characterize the likelihood of successful and durable repair.  In the absence of significant coronary artery disease the patient would likely be good candidate for minimally invasive approach for surgery.   Plan:  The patient was counseled at length regarding her diagnosis of severe primary mitral regurgitation.  We reviewed the results of their diagnostic tests including images from the most recent echocardiogram and the report from her MRI.  We discussed the natural history of mitral regurgitation as well as alternative treatment strategies.  We discussed the impact of age, current state of health, and any significant comorbid medical problems on clinical decision making.  We went on to discuss the indications, risks and potential benefits of mitral valve repair as well as the timing of surgical intervention.  The rationale for elective surgery has been explained, including a comparison between surgery and continued medical therapy with close follow-up.  The patient is interested in considering elective mitral valve repair but not certain whether or not  she would like to proceed with surgery in the immediate future.  I reassured her that continued close follow-up remains very reasonable option under the circumstances.  As a next step we plan transesophageal echocardiogram to better evaluate the functional anatomy of her bileaflet prolapse as well as the anticipated feasibility of successful and durable mitral valve repair.  The patient will return to our office to review the results of TEE in approximately 2 weeks.  If she decides at that time that she wishes to proceed with surgery in the near future we will arrange for diagnostic cardiac catheterization.  All of her questions have been addressed.    I spent in excess of 90 minutes during the conduct of this office consultation and >50% of this time involved direct face-to-face encounter with the  patient for counseling and/or coordination of their care.    Valentina Gu. Roxy Manns, MD 07/09/2020 3:49 PM

## 2020-07-09 NOTE — Progress Notes (Signed)
I am reader B on May 11 and can do that day

## 2020-07-09 NOTE — Patient Instructions (Signed)
Continue all previous medications without any changes at this time  

## 2020-07-10 ENCOUNTER — Telehealth: Payer: Self-pay

## 2020-07-10 ENCOUNTER — Encounter: Payer: Medicare Other | Admitting: Thoracic Surgery (Cardiothoracic Vascular Surgery)

## 2020-07-10 DIAGNOSIS — I340 Nonrheumatic mitral (valve) insufficiency: Secondary | ICD-10-CM

## 2020-07-10 DIAGNOSIS — I341 Nonrheumatic mitral (valve) prolapse: Secondary | ICD-10-CM

## 2020-07-10 NOTE — Telephone Encounter (Signed)
Late entry:   3:30pm- spoke to pt regarding TEE. Explained to pt that once date and time are established that this RN would call her back to review pre-procedural instructions. Pt verbalizes understanding.   5:05pm- Spoke with pt regarding TEE scheduled. Pt to have TEE done Friday, May 13th at [facility] ordered and pt intends to have them done at our office a few days prior to procedure. Reviewed pre-procedure instructions with pt. Pt verbalizes understanding.   Lab work and Dietitian and mailed to pt. Pt instructed to call if she has any additional questions.

## 2020-07-11 ENCOUNTER — Telehealth: Payer: Self-pay

## 2020-07-15 NOTE — Progress Notes (Signed)
Endoscopy would not add on another outpatient on 05/11.

## 2020-07-17 NOTE — Telephone Encounter (Signed)
Covid testing appointment made and mychart message sent to pt with location. Pt to contact us with any additional questions.

## 2020-07-22 ENCOUNTER — Other Ambulatory Visit (HOSPITAL_COMMUNITY)
Admission: RE | Admit: 2020-07-22 | Discharge: 2020-07-22 | Disposition: A | Discharge status: Home / Self Care | Payer: Medicare Other | Source: Ambulatory Visit | Attending: Internal Medicine | Admitting: Internal Medicine

## 2020-07-22 DIAGNOSIS — Z01812 Encounter for preprocedural laboratory examination: Secondary | ICD-10-CM | POA: Diagnosis present

## 2020-07-22 DIAGNOSIS — Z8616 Personal history of COVID-19: Secondary | ICD-10-CM

## 2020-07-22 DIAGNOSIS — U071 COVID-19: Secondary | ICD-10-CM | POA: Diagnosis not present

## 2020-07-22 HISTORY — DX: Personal history of COVID-19: Z86.16

## 2020-07-22 LAB — SARS CORONAVIRUS 2 (TAT 6-24 HRS): SARS Coronavirus 2: POSITIVE — AB

## 2020-07-23 ENCOUNTER — Telehealth: Payer: Self-pay

## 2020-07-23 LAB — BASIC METABOLIC PANEL
BUN/Creatinine Ratio: 23 (ref 12–28)
BUN: 19 mg/dL (ref 8–27)
CO2: 23 mmol/L (ref 20–29)
Calcium: 11.2 mg/dL — ABNORMAL HIGH (ref 8.7–10.3)
Chloride: 100 mmol/L (ref 96–106)
Creatinine, Ser: 0.82 mg/dL (ref 0.57–1.00)
Glucose: 89 mg/dL (ref 65–99)
Potassium: 5.4 mmol/L — ABNORMAL HIGH (ref 3.5–5.2)
Sodium: 137 mmol/L (ref 134–144)
eGFR: 75 mL/min/{1.73_m2} (ref >59)

## 2020-07-23 LAB — CBC
Hematocrit: 39.2 % (ref 34.0–46.6)
Hemoglobin: 13.4 g/dL (ref 11.1–15.9)
MCH: 30.7 pg (ref 26.6–33.0)
MCHC: 34.2 g/dL (ref 31.5–35.7)
MCV: 90 fL (ref 79–97)
Platelets: 318 10*3/uL (ref 150–450)
RBC: 4.37 x10E6/uL (ref 3.77–5.28)
RDW: 12.1 % (ref 11.7–15.4)
WBC: 5.7 10*3/uL (ref 3.4–10.8)

## 2020-07-23 NOTE — Telephone Encounter (Signed)
07/23/20 - Surgeon viewed patient's Positive COVID19 lab results via [place]. MBM

## 2020-07-24 ENCOUNTER — Other Ambulatory Visit: Payer: Medicare Other | Admitting: Obstetrics and Gynecology

## 2020-07-24 ENCOUNTER — Other Ambulatory Visit: Payer: Medicare Other

## 2020-07-25 ENCOUNTER — Ambulatory Visit (HOSPITAL_COMMUNITY): Admission: RE | Admit: 2020-07-25 | Payer: Medicare Other | Source: Home / Self Care | Admitting: Internal Medicine

## 2020-07-25 ENCOUNTER — Encounter (HOSPITAL_COMMUNITY): Admission: RE | Payer: Self-pay | Source: Home / Self Care

## 2020-07-25 SURGERY — ECHOCARDIOGRAM, TRANSESOPHAGEAL
Anesthesia: Monitor Anesthesia Care

## 2020-07-28 ENCOUNTER — Encounter: Payer: Medicare Other | Admitting: Thoracic Surgery (Cardiothoracic Vascular Surgery)

## 2020-08-05 ENCOUNTER — Telehealth: Payer: Self-pay

## 2020-08-05 NOTE — Telephone Encounter (Signed)
Spoke with pt regarding rescheduling of TEE for MV repair. Pt would like to keep her appointment with Dr. Gwenlyn Found for 6/8 and get back on the same page. Pt does not feel like we can get testing, procedures, etc in order before Dr. Lawanda Cousins. Pt will come on 6/8 and her and Dr. Gwenlyn Found will decide on a plan moving forward.

## 2020-08-20 ENCOUNTER — Other Ambulatory Visit: Payer: Self-pay

## 2020-08-20 ENCOUNTER — Ambulatory Visit (INDEPENDENT_AMBULATORY_CARE_PROVIDER_SITE_OTHER): Payer: Medicare Other | Admitting: Cardiovascular Disease

## 2020-08-20 ENCOUNTER — Encounter: Payer: Self-pay | Admitting: Cardiovascular Disease

## 2020-08-20 VITALS — BP 122/62 | HR 78 | Ht 64.0 in | Wt 96.0 lb

## 2020-08-20 DIAGNOSIS — Z13220 Encounter for screening for lipoid disorders: Secondary | ICD-10-CM | POA: Diagnosis not present

## 2020-08-20 DIAGNOSIS — I340 Nonrheumatic mitral (valve) insufficiency: Secondary | ICD-10-CM | POA: Diagnosis not present

## 2020-08-20 LAB — LIPID PANEL
Chol/HDL Ratio: 2.4 ratio (ref 0.0–4.4)
Cholesterol, Total: 230 mg/dL — ABNORMAL HIGH (ref 100–199)
HDL: 94 mg/dL (ref >39)
LDL Chol Calc (NIH): 124 mg/dL — ABNORMAL HIGH (ref 0–99)
Triglycerides: 69 mg/dL (ref 0–149)
VLDL Cholesterol Cal: 12 mg/dL (ref 5–40)

## 2020-08-20 LAB — HEPATIC FUNCTION PANEL
ALT: 17 IU/L (ref 0–32)
AST: 20 IU/L (ref 0–40)
Albumin: 5 g/dL — ABNORMAL HIGH (ref 3.7–4.7)
Alkaline Phosphatase: 65 IU/L (ref 44–121)
Bilirubin Total: 0.5 mg/dL (ref 0.0–1.2)
Bilirubin, Direct: 0.12 mg/dL (ref 0.00–0.40)
Total Protein: 7.3 g/dL (ref 6.0–8.5)

## 2020-08-20 NOTE — Patient Instructions (Signed)
Medication Instructions:  Your physician recommends that you continue on your current medications as directed. Please refer to the Current Medication list given to you today.  *If you need a refill on your cardiac medications before your next appointment, please call your pharmacy*   Lab Work: Lipid Panel & Liver Function Panel today   If you have labs (blood work) drawn today and your tests are completely normal, you will receive your results only by: Marland Kitchen MyChart Message (if you have MyChart) OR . A paper copy in the mail If you have any lab test that is abnormal or we need to change your treatment, we will call you to review the results.   Testing/Procedures: Your physician has requested that you have an echocardiogram. Echocardiography is a painless test that uses sound waves to create images of your heart. It provides your doctor with information about the size and shape of your heart and how well your heart's chambers and valves are working. This procedure takes approximately one hour. There are no restrictions for this procedure. -- [address]  -- due in December 2022   Follow-Up: At [facility], you and your health needs are our priority.  As part of our continuing mission to provide you with exceptional heart care, we have created designated Provider Care Teams.  These Care Teams include your primary Cardiologist (physician) and Advanced Practice Providers (APPs -  Physician Assistants and Nurse Practitioners) who all work together to provide you with the care you need, when you need it.  We recommend signing up for the patient portal called "MyChart".  Sign up information is provided on this After Visit Summary.  MyChart is used to connect with patients for Virtual Visits (Telemedicine).  Patients are able to view lab/test results, encounter notes, upcoming appointments, etc.  Non-urgent messages can be sent to your provider as well.   To learn more about what  you can do with MyChart, go to [email or web address].    Your next appointment:   6 month(s)  The format for your next appointment:   In Person  Provider:   Quay Burow, MD   Other Instructions

## 2020-08-20 NOTE — Assessment & Plan Note (Signed)
Long history of mitral valve prolapse with severe MR and normal LV function.  She is completely asymptomatic.  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  There was a question of a mass on the interatrial septum however MRI did not see this.  I did refer her to Dr. Roxy Manns who today thorough evaluation and thought she was an acceptable candidate for minimally invasive mitral valve repair.  She is very active and is completely asymptomatic.  This will be done prophylactically.  Unfortunately, Dr.Owen  is moving out of state but the patient is open to being referred to [facility], Dr. Evelina Dun.  She will need a transesophageal echo prior to that as well as a right left heart cath.  I am going to get transthoracic echo on her in 6 months and we will see her back after that.

## 2020-08-20 NOTE — Progress Notes (Signed)
08/20/2020 Brenda Cobb   [birth year]  [id number]  Primary Physician Princess Bruins, MD Primary Cardiologist: Lorretta Harp MD FACP, [place], [place], [place]  HPI:  Brenda Cobb is a 75 y.o.  thin and fit-appearing, married Caucasian female with no children who I last saw in the office3/01/2021.Marland KitchenShe has a history of bileaflet severe mitral valve prolapse with regurgitation which she is totally asymptomatic from. She also has mild hyperlipidemia. Her recent 2D echo performed January 07, 2012, revealed normal LV size and function with mild concentric LVH, mildly dilated left atrium of 42 mm. Her echo really has not changed significantly since it was done prior to that.since I saw her a year ago she is completely asymptomatic.Recent 2-D echocardiogram performed3/4/2021showed severe bileaflet prolapse with moderate to severe MR and basal septal hypertrophy with severe left atrial enlargementunchanged from her prior echo.She just got a new puppy, Springer spaniel, and [place] several miles at a time without limitation or symptoms.  She walks 3 miles a day 5 days a week without symptoms with her springer spaniel.  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  Since I saw her 3 months ago she did have an MRI that did not show an intra-atrial mass.  She also saw Dr. Roxy Manns for cardiothoracic surgical evaluation for minimal invasive mitral valve repair.  He thought she was an acceptable candidate.  She is scheduled to have a TEE but this never was performed.  She remains asymptomatic walking 3 miles at a time with her new puppy.  Her LV function has remained normal in size and function.  She just does need to have a fibroid tumor removed which I told her she can have done at minimal cardiac risk.  We will continue to follow her  mitral gravitation noninvasively by echo in 6 months.   Current Meds  Medication Sig  . aspirin 81 MG tablet Take 1 tablet (81 mg total) by mouth daily.  . cetirizine (ZYRTEC) 10 MG tablet Take 10 mg by mouth daily as needed for allergies.  . diphenhydrAMINE HCl, Sleep, (ZZZQUIL) 25 MG CAPS Take 25 mg by mouth at bedtime.  . fluticasone (FLONASE) 50 MCG/ACT nasal spray Place 1 spray into both nostrils daily as needed for allergies or rhinitis.  Marland Kitchen irbesartan (AVAPRO) 150 MG tablet TAKE 1 TABLET(150 MG) BY MOUTH DAILY (Patient taking differently: Take 150 mg by mouth daily. TAKE 1 TABLET(150 MG) BY MOUTH DAILY)  . levothyroxine (SYNTHROID) 88 MCG tablet Take 88 mcg by mouth daily before breakfast.  . zolpidem (AMBIEN) 10 MG tablet Take 2.5 mg by mouth at bedtime as needed for sleep.     No Known Allergies  Social History   Socioeconomic History  . Marital status: Widowed    Spouse name: Not on file  . Number of children: Not on file  . Years of education: Not on file  . Highest education level: Not on file  Occupational History  . Not on file  Tobacco Use  . Smoking status: Never Smoker  . Smokeless tobacco: Never Used  Vaping Use  . Vaping Use: Never used  Substance and Sexual Activity  . Alcohol use: Yes    Alcohol/week: 4.0 standard drinks    Types: 4 Standard drinks or equivalent per week    Comment: socially  .  Drug use: No  . Sexual activity: Never    Birth control/protection: Post-menopausal    Comment: 1st intercourse 75 yo-Fewer than 5 partners  Other Topics Concern  . Not on file  Social History Narrative  . Not on file   Social Determinants of Health   Financial Resource Strain: Not on file  Food Insecurity: Not on file  Transportation Needs: Not on file  Physical Activity: Not on file  Stress: Not on file  Social Connections: Not on file  Intimate Partner Violence: Not on file     Review of Systems: General: negative for chills, fever, night sweats  or weight changes.  Cardiovascular: negative for chest pain, dyspnea on exertion, edema, orthopnea, palpitations, paroxysmal nocturnal dyspnea or shortness of breath Dermatological: negative for rash Respiratory: negative for cough or wheezing Urologic: negative for hematuria Abdominal: negative for nausea, vomiting, diarrhea, bright red blood per rectum, melena, or hematemesis Neurologic: negative for visual changes, syncope, or dizziness All other systems reviewed and are otherwise negative except as noted above.    Blood pressure 122/62, pulse 78, height 5\' 4"  (1.626 m), weight 96 lb (43.5 kg).  General appearance: alert and no distress Neck: no adenopathy, no carotid bruit, no JVD, supple, symmetrical, trachea midline and thyroid not enlarged, symmetric, no tenderness/mass/nodules Lungs: clear to auscultation bilaterally Heart: 3/6 apical systolic murmur consistent with severe mitral vegetation. Extremities: extremities normal, atraumatic, no cyanosis or edema Pulses: 2+ and symmetric Skin: Skin color, texture, turgor normal. No rashes or lesions Neurologic: Alert and oriented X 3, normal strength and tone. Normal symmetric reflexes. 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  She is completely asymptomatic.  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  There was a question of a mass on the interatrial septum however MRI did not see this.  I did refer her to Dr. Roxy Manns who today thorough evaluation and thought she was an acceptable candidate for minimally invasive mitral valve repair.  She is very active and is completely asymptomatic.  This will be done prophylactically.  Unfortunately, Dr.Owen  is moving out of state but the patient is open  to being referred to [facility], Dr. Evelina Dun.  She will need a transesophageal echo prior to that as well as a right left heart cath.  I am going to get transthoracic echo on her in 6 months and we will see her back after that.      Lorretta Harp MD FACP,FACC,FAHA, [facility] 08/20/2020 10:05 AM

## 2020-09-04 ENCOUNTER — Encounter: Payer: Self-pay | Admitting: Obstetrics & Gynecology

## 2020-09-04 ENCOUNTER — Ambulatory Visit (INDEPENDENT_AMBULATORY_CARE_PROVIDER_SITE_OTHER): Payer: Medicare Other | Admitting: Obstetrics & Gynecology

## 2020-09-04 ENCOUNTER — Other Ambulatory Visit: Payer: Self-pay

## 2020-09-04 ENCOUNTER — Ambulatory Visit (INDEPENDENT_AMBULATORY_CARE_PROVIDER_SITE_OTHER): Payer: Medicare Other

## 2020-09-04 DIAGNOSIS — R1900 Intra-abdominal and pelvic swelling, mass and lump, unspecified site: Secondary | ICD-10-CM

## 2020-09-04 DIAGNOSIS — R9389 Abnormal findings on diagnostic imaging of other specified body structures: Secondary | ICD-10-CM

## 2020-09-04 NOTE — Progress Notes (Signed)
    Brenda Cobb February 15, 1946 [id number]        75 y.o.  G0  RP: Pelvic mass for Pelvic US  HPI: Asymptomatic with no abdominal or pelvic pain.  Incidental fining of a large 12.5 cm complex pelvic mass by MRI July 02, 2020.   OB History  Gravida Para Term Preterm AB Living  0            SAB IAB Ectopic Multiple Live Births               Past medical history,surgical history, problem list, medications, allergies, family history and social history were all reviewed and documented in the EPIC chart.   Directed ROS with pertinent positives and negatives documented in the history of present illness/assessment and plan.  Exam:  There were no vitals filed for this visit. General appearance:  Normal  Pelvic US today: T/V images.  Anteverted uterus distorted to the left pelvis normal in size and shape with a right pedunculated fibroid measured at 12.4 x 8.9 cm.  The overall uterine size excluding the fibroid is measured at 7.27 x 3.85 x 4.23 cm.  Thickened endometrium with cystic spaces, cannot rule out endometrial polyp with the appearance of a feeder vessel.  The endometrial lining is measured at 6.02 mm.  Due to distortion from the fibroid, the ovary measured is located in the left pelvis but on the right side of the uterus.  The right ovary is normal in size and appearance.  The left ovary is not seen due to fibroid distortion.  No adnexal mass seen otherwise.  No free fluid in the pelvis.   Assessment/Plan:  75 y.o. G0P0   1. Pelvic mass Pelvic ultrasound findings thoroughly reviewed with patient.  Anteverted uterus normal in size with a right pedunculated fibroids measured at 12.4 x 8.9 cm.  Stable in size since the MRI in April 2022.  The endometrial lining is thickened with cystic spaces probable endometrial polyp.  No adnexal mass and no free fluid in the pelvis.  Patient reassured about the finding of a uterine fibroid of the benign appearance and stable in size since the MRI of  April 2022.  Patient is completely asymptomatic.  Decision to follow with a repeat pelvic ultrasound in 6 months. - US Transvaginal Non-OB; Future in 6 months  2. Thickened endometrium Thickened endometrium compatible with an endometrial polyp.  Follow-up sonohysterogram to further investigate.  Procedure explained to patient. - [place] Sonohysterogram; Future  Princess Bruins MD, 9:24 AM 09/04/2020

## 2020-09-07 ENCOUNTER — Encounter: Payer: Self-pay | Admitting: Obstetrics & Gynecology

## 2020-09-08 ENCOUNTER — Inpatient Hospital Stay: Payer: Medicare Other | Attending: Gynecologic Oncology | Admitting: Gynecologic Oncology

## 2020-09-08 ENCOUNTER — Encounter: Payer: Self-pay | Admitting: Gynecologic Oncology

## 2020-09-08 DIAGNOSIS — D259 Leiomyoma of uterus, unspecified: Secondary | ICD-10-CM

## 2020-09-08 NOTE — Progress Notes (Signed)
Gynecologic Oncology Telehealth Consult Note: Gyn-Onc  I connected with Brenda Cobb on 09/08/20 at  3:30 PM EDT by telephone and verified that I am speaking with the correct person using two identifiers.  I discussed the limitations, risks, security and privacy concerns of performing an evaluation and management service by telemedicine and the availability of in-person appointments. I also discussed with the patient that there may be a patient responsible charge related to this service. The patient expressed understanding and agreed to proceed.  Other persons participating in the visit and their role in the encounter: none.  Patient's location: Home Provider's location: [facility]  Reason for Visit: Follow-up after recent ultrasound  Interval History: Doing well since her last exam. Continues to deny any symptoms including pain, pressure, change to bowel or bladder function, change to appetite.   Recently saw Dr. Gwenlyn Found again after getting a cardiac MRI to follow-up on possible cardiac mass seen on ECHO earlier this year. Was seen by Dr. Roxy Manns. MRI shows no cardiac mass and recommendation has been made for close continued surveillance from a mitral valve standpoint.  Past Medical/Surgical History: Past Medical History:  Diagnosis Date   Hyperlipidemia    Mitral valve disorder    2D ECHO, 01/07/2012 - EF >55%, LA severely dilated, moderate mitral valve prolapse, moderate-severe regurgitation, requires antibiotic prophylaxis   Osteopenia 04/2017   T score -1.2 FRAX 7% / 0.9%   Pelvic mass    Thyroid disease    Hyperthyroid--Graves dis    Past Surgical History:  Procedure Laterality Date   BREAST BIOPSY     Benign   BREAST SURGERY     Benign breast cyst    Family History  Problem Relation Age of Onset   Alzheimer's disease Father    Aneurysm Brother    ALS Maternal Grandmother    Parkinson's disease Maternal Grandfather    Breast cancer Paternal Grandmother  102   Breast cancer Maternal Aunt 50    Social History   Socioeconomic History   Marital status: Widowed    Spouse name: Not on file   Number of children: Not on file   Years of education: Not on file   Highest education level: Not on file  Occupational History   Not on file  Tobacco Use   Smoking status: Never   Smokeless tobacco: Never  Vaping Use   Vaping Use: Never used  Substance and Sexual Activity   Alcohol use: Yes    Alcohol/week: 4.0 standard drinks    Types: 4 Standard drinks or equivalent per week    Comment: socially   Drug use: No   Sexual activity: Never    Birth control/protection: Post-menopausal    Comment: 1st intercourse 75 yo-Fewer than 5 partners  Other Topics Concern   Not on file  Social History Narrative   Not on file   Social Determinants of Health   Financial Resource Strain: Not on file  Food Insecurity: Not on file  Transportation Needs: Not on file  Physical Activity: Not on file  Stress: Not on file  Social Connections: Not on file    Current Medications:  Current Outpatient Medications:    aspirin 81 MG tablet, Take 1 tablet (81 mg total) by mouth daily., Disp: 90 tablet, Rfl: 3   cetirizine (ZYRTEC) 10 MG tablet, Take 10 mg by mouth daily as needed for allergies., Disp: , Rfl:    diphenhydrAMINE HCl, Sleep, (ZZZQUIL) 25 MG CAPS, Take 25 mg by  mouth at bedtime., Disp: , Rfl:    fluticasone (FLONASE) 50 MCG/ACT nasal spray, Place 1 spray into both nostrils daily as needed for allergies or rhinitis., Disp: , Rfl:    irbesartan (AVAPRO) 150 MG tablet, TAKE 1 TABLET(150 MG) BY MOUTH DAILY (Patient taking differently: Take 150 mg by mouth daily. TAKE 1 TABLET(150 MG) BY MOUTH DAILY), Disp: 90 tablet, Rfl: 3   levothyroxine (SYNTHROID) 88 MCG tablet, Take 88 mcg by mouth daily before breakfast., Disp: , Rfl:    zolpidem (AMBIEN) 10 MG tablet, Take 2.5 mg by mouth at bedtime as needed for sleep., Disp: , Rfl:   Review of  Symptoms: Pertinent positives as per HPI.  Physical Exam: There were no vitals taken for this visit. Deferred given limitations of phone visit.  Laboratory & Radiologic Studies: Pelvic ultrasound on 6/23: T/V images.  Anteverted uterus distorted to the left pelvis normal in size and shape with a right pedunculated fibroid measured at 12.4 x 8.9 cm.  The overall uterine size excluding the fibroid is measured at 7.27 x 3.85 x 4.23 cm.  Thickened endometrium with cystic spaces, cannot rule out endometrial polyp with the appearance of a feeder vessel.  The endometrial lining is measured at 6.02 mm.  Due to distortion from the fibroid, the ovary measured is located in the left pelvis but on the right side of the uterus.  The right ovary is normal in size and appearance.  The left ovary is not seen due to fibroid distortion.  No adnexal mass seen otherwise.  No free fluid in the pelvis.  Assessment & Plan: Brenda Cobb is a 75 y.o. woman with asymptomatic complex adnexal mass that on imaging appears most consistent with a uterine fibroid.  The mass has remained stable in size. The patient continues to be asymptomatic. She is scheduled to undergo repeat pelvic ultrasound in December. If this continues to show stable size and character of mass and it remains asymptomatic, I discussed that longer interval surveillance imaging could be considered (year before next ultrasound) or cessation of imaging surveillance unless change in symptoms.   She has follow-up scheduled with Dr. Dellis Filbert regarding possible thickened endometrial lining. I encouraged her to continue with planned work-up.  I discussed the assessment and treatment plan with the patient. The patient was provided with an opportunity to ask questions and all were answered. The patient agreed with the plan and demonstrated an understanding of the instructions.   The patient was advised to call back or see an in-person evaluation if the symptoms  worsen or if the condition fails to improve as anticipated.   18 minutes of total time was spent for this patient encounter, including preparation, over the phone counseling with the patient and coordination of care, and documentation of the encounter.   Jeral Pinch, MD  Division of Gynecologic Oncology  Department of Obstetrics and Gynecology  [facility]

## 2020-10-09 ENCOUNTER — Other Ambulatory Visit: Payer: Self-pay

## 2020-10-09 ENCOUNTER — Ambulatory Visit (INDEPENDENT_AMBULATORY_CARE_PROVIDER_SITE_OTHER): Payer: Medicare Other

## 2020-10-09 ENCOUNTER — Ambulatory Visit (INDEPENDENT_AMBULATORY_CARE_PROVIDER_SITE_OTHER): Payer: Medicare Other | Admitting: Obstetrics & Gynecology

## 2020-10-09 ENCOUNTER — Other Ambulatory Visit: Payer: Self-pay | Admitting: Obstetrics & Gynecology

## 2020-10-09 DIAGNOSIS — N840 Polyp of corpus uteri: Secondary | ICD-10-CM | POA: Diagnosis not present

## 2020-10-09 DIAGNOSIS — R9389 Abnormal findings on diagnostic imaging of other specified body structures: Secondary | ICD-10-CM

## 2020-10-09 DIAGNOSIS — R1900 Intra-abdominal and pelvic swelling, mass and lump, unspecified site: Secondary | ICD-10-CM | POA: Diagnosis not present

## 2020-10-09 NOTE — Progress Notes (Signed)
Brenda Cobb [birth year] [id number]        74 y.o.  G0   RP: Thickened Endometrial line for Sonohysto  HPI: Postmenopause, well on no HRT.  No PMB.  Asymptomatic with no abdominal or pelvic pain.  Incidental fining of a large 12.5 cm complex pelvic mass by MRI July 02, 2020, Pelvic US 09/04/2020 showed a large Fibroid and a thickened Endometrial line.     OB History  Gravida Para Term Preterm AB Living  0            SAB IAB Ectopic Multiple Live Births               Past medical history,surgical history, problem list, medications, allergies, family history and social history were all reviewed and documented in the EPIC chart.   Directed ROS with pertinent positives and negatives documented in the history of present illness/assessment and plan.  Exam:  There were no vitals filed for this visit. General appearance:  Normal                                                                    Sono Infusion Hysterogram ( procedure note)   The initial transvaginal ultrasound demonstrated the following: T/V images and T/a images.  Anteverted uterus distorted to left pelvis due to a large right pedunculated fibroid measured at 11.7 x 10 x 11 cm.  The overall uterine size excluding the fibroid is measured at 7.5 x 2.3 x 5.08 cm.  Thickened endometrium with cystic spaces.  The endometrial lining is measured at 7.86 mm.  Both ovaries previously seen on June 27's 2022 scan.  Today's scan showing a normal right ovary and the left ovary was not seen.  No adnexal mass seen.  Moderate clear free fluid in the right adnexa.  The speculum  was inserted and the cervix cleansed with Betadine solution after confirming that patient has no allergies.A small sonohysterography catheterwas utilized.  Insertion was facilitated with ring forceps, using a spear-like motion the catheter was inserted to the fundus of the uterus. The speculum is then removed carefully to avoid dislodging the catheter. The  catheter was flushed with sterile saline delete prior to insertion to rid it of small amounts of air.the sterile saline solution was infused into the uterine cavity as a vaginal ultrasound probe was then placed in the vagina for full visualization of the uterine cavity from a transvaginal approach. The following was noted: Sonohysterogram performed using 0.9% normal saline.  After normal saline was introduced into the endometrial cavity, at least 3 filling defects were seen within the endometrium and at least 1 filling defect within the cervix.  The catheter was then removed after retrieving some of the saline from the intrauterine cavity. An endometrial biopsy was not done. Patient tolerated procedure well. She had received a tablet of Aleve for discomfort.     Assessment/Plan:  75 y.o. G0P0   1. Thickened endometrium Thickened endometrium probably due to endometrial polyps.  2. Endometrial polyp 3 filling defects in the endometrium and 1 filling defect in the cervix visualized with sonohysterogram.  Probably correspond to polyps.  Decision to proceed with hysteroscopy with MyoSure excisions and D&C.  Information given and pamphlet given.  Surgery and  risks reviewed.  May do preop through video visit or in person.  3. Pelvic mass  Pedunculated fibroid which is asymptomatic.  Stable since first seen by CT scan.  Decision to observe.  Princess Bruins MD, 10:16 AM 10/09/2020

## 2020-10-13 ENCOUNTER — Encounter: Payer: Self-pay | Admitting: Obstetrics & Gynecology

## 2020-10-17 ENCOUNTER — Telehealth: Payer: Self-pay | Admitting: *Deleted

## 2020-10-17 NOTE — Telephone Encounter (Addendum)
Patient seen 10/16/2020 Dr. Jacelyn Pi endocrinologist and was told she will have to have surgery in 6-8 weeks pertaining to her thyroid.  Last visit 10/09/2020 with Dr. Dellis Filbert: Decision to proceed with hysteroscopy with MyoSure excisions and D&C.  Patient would like to know which surgery to have 1st. She prefers to have the hysteroscopy with MyoSure excisions and D&C. As well as should there be any time in between the 2 surgeries before scheduling the surgery with Dr. Chalmers Cater.

## 2020-10-20 ENCOUNTER — Other Ambulatory Visit (HOSPITAL_COMMUNITY): Payer: Self-pay | Admitting: Endocrinology

## 2020-10-20 ENCOUNTER — Other Ambulatory Visit: Payer: Self-pay | Admitting: Endocrinology

## 2020-10-20 DIAGNOSIS — E210 Primary hyperparathyroidism: Secondary | ICD-10-CM

## 2020-10-20 NOTE — Telephone Encounter (Signed)
-----   Message from Princess Bruins, MD sent at 10/09/2020 10:19 AM EDT ----- Regarding: Schedule surgery Surgery:  Hysteroscopy, Myosure excision, Dilation and Curettage  Diagnosis:  Endometrial Polyps/Endocervical Polyp  Location: [facility]  Status: Outpatient  Time: 41 Minutes  Assistant: N/A  Urgency: Urgent (patient wants asap)  Pre-Op Appointment: To Be Scheduled  Post-Op Appointment(s): 2 Weeks  Time Out Of Work: Day of surgery

## 2020-10-20 NOTE — Telephone Encounter (Signed)
Spoke with patient. Reviewed surgery dates.  Patient is scheduled for a parathyroid scan on 10/29/20, ordered by Dr. Chalmers Cater, possible thyroid surgery after.   Reviewed potential surgery dates for Hysteroscopy D&C, patient request to proceed with next available on 11/18/20. Advised I will update Dr. Dellis Filbert on endocrinology evaluation and return call with recommendations. Will proceed with scheduling on 9/6 if ok to proceed. Patient agreeable.   Dr. Dellis Filbert -please review endocrinology evaluation, ok to proceed with Hysteroscopy D&C on 9/6?

## 2020-10-27 NOTE — Telephone Encounter (Signed)
Brenda Bruins, MD  You 3 days ago   Yes from lab results if appears that she would be fine for surgery.  Please ask for surgery clearance from Dr Chalmers Cater endocrino (I cannot see her notes)    Call placed to [facility]/Dr. Chalmers Cater at [phone].  Spoke with Prentiss Bells, requested surgical clearance for Hysteroscopy D&C, scheduling for 11/18/20.

## 2020-10-28 NOTE — Telephone Encounter (Signed)
Spoke with patient, advised as seen below.  Patient request to proceed with surgery on 11/18/20.  Advised patient I will forward to business office for return call. I will return call once surgery date and time confirmed. Patient verbalizes understanding and is agreeable.   Surgery request sent.   Routing to [employer]

## 2020-10-28 NOTE — Telephone Encounter (Signed)
Message received from [facility] at Dr. Almetta Lovely office. Ok to continue with surgery as planned for 11/18/20.   Call returned to [facility], requested letter for surgical clearance to be faxed to [phone], ATTN: [facility].

## 2020-10-29 ENCOUNTER — Encounter: Payer: Self-pay | Admitting: Obstetrics & Gynecology

## 2020-10-29 ENCOUNTER — Other Ambulatory Visit: Payer: Self-pay

## 2020-10-29 ENCOUNTER — Ambulatory Visit (HOSPITAL_COMMUNITY)
Admission: RE | Admit: 2020-10-29 | Discharge: 2020-10-29 | Disposition: A | Discharge status: Home / Self Care | Payer: Medicare Other | Source: Ambulatory Visit | Attending: Endocrinology | Admitting: Endocrinology

## 2020-10-29 ENCOUNTER — Encounter (HOSPITAL_COMMUNITY)
Admission: RE | Admit: 2020-10-29 | Discharge: 2020-10-29 | Disposition: A | Discharge status: Home / Self Care | Payer: Medicare Other | Source: Ambulatory Visit | Attending: Endocrinology | Admitting: Endocrinology

## 2020-10-29 DIAGNOSIS — E210 Primary hyperparathyroidism: Secondary | ICD-10-CM

## 2020-10-29 IMAGING — NM NM PARATHYROID W/ SPECT
6 series · 16 of 16 positions shown · non-contrast
Comparison: None

CLINICAL DATA: Primary hyperparathyroidism, hypercalcemia = 11.2,
elevated parathormone = 117

EXAM:
NM PARATHYROID SCINTIGRAPHY AND SPECT IMAGING
TECHNIQUE: Following intravenous administration of radiopharmaceutical, early
and 2-hour delayed planar images were obtained in the anterior
projection. Delayed triplanar SPECT images were also obtained at 2
hours.
RADIOPHARMACEUTICALS:  25.2 mCi [RT] Sestamibi IV

[Series 1: 15 min ant · 2.07mm/px · 1 of 1 slices shown]
[im 1/1]
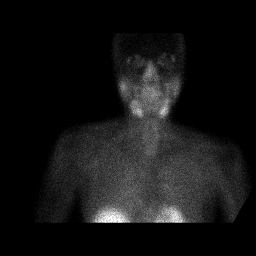

[Series 1: wbr_bone_40 15 min ant · 2.07mm/px · 1 of 1 slices shown]
[im 1/1]
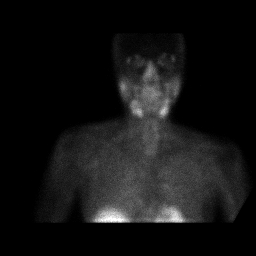

[Series 2: 2 hr ant · 2.07mm/px · 1 of 1 slices shown]
[im 1/1]
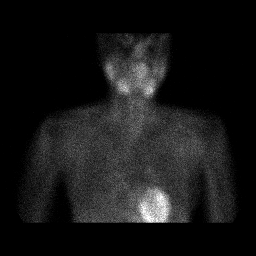

[Series 2: wbr_bone_40 2 hr ant · 2.07mm/px · 1 of 1 slices shown]
[im 1/1]
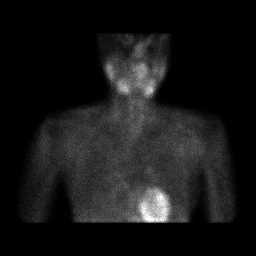

[Series 3: spect parathyroid · 4.14mm/px · 6 of 64 frames shown]
[frame 6/64  full-range]
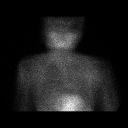
[frame 16/64  full-range]
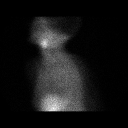
[frame 27/64  full-range]
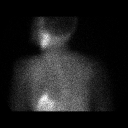
[frame 38/64  full-range]
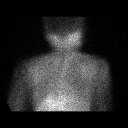
[frame 48/64  full-range]
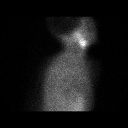
[frame 59/64  full-range]
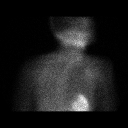

[Series 3: wbr_bone spect parathyroid · 4.1mm · 4.14mm/px · 6 of 128 frames shown]
[frame 11/128]
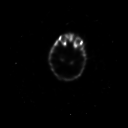
[frame 32/128]
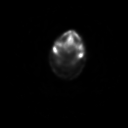
[frame 54/128]
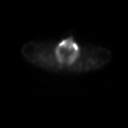
[frame 75/128]
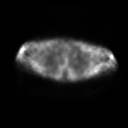
[frame 96/128]
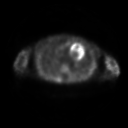
[frame 118/128]
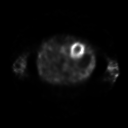

[16 of 16 positions shown; findings below may reference images not displayed]

FINDINGS: Planar imaging: Normal distribution of sestamibi in thyroid lobes.
Normal washout on delayed image. No abnormal sites of sestamibi
retention identified at the expected positions of the parathyroid
glands.

SPECT imaging: No abnormal sites of sestamibi retention are
identified to suggest parathyroid adenoma. No ectopic localization
of tracer in the mediastinum.
IMPRESSION: Negative parathyroid scan.

## 2020-10-29 MED ORDER — TECHNETIUM TC 99M SESTAMIBI - CARDIOLITE
25.2000 | Freq: Once | INTRAVENOUS | Status: AC
  Administered 2020-10-29: 25.2 via INTRAVENOUS

## 2020-10-31 NOTE — Telephone Encounter (Signed)
Spoke with patient. Surgery date request confirmed.  Advised surgery is scheduled for 11/18/20, [facility] at 10am.  Surgery instruction sheet and hospital brochure reviewed, printed copy will be mailed.  Patient advised if Covid screening and quarantine requirements and agreeable.   Routing to provider. Encounter closed.   Cc: Hayley Carder

## 2020-11-03 MED ORDER — SILVER NITRATE-POT NITRATE 75-25 % EX MISC
CUTANEOUS | Status: AC
  Filled 2020-11-03: qty 10

## 2020-11-07 ENCOUNTER — Ambulatory Visit (INDEPENDENT_AMBULATORY_CARE_PROVIDER_SITE_OTHER): Payer: Medicare Other | Admitting: Obstetrics & Gynecology

## 2020-11-07 ENCOUNTER — Other Ambulatory Visit: Payer: Self-pay

## 2020-11-07 ENCOUNTER — Encounter: Payer: Self-pay | Admitting: Obstetrics & Gynecology

## 2020-11-07 VITALS — BP 104/76 | HR 74 | Resp 16 | Ht 63.0 in | Wt 100.0 lb

## 2020-11-07 DIAGNOSIS — N840 Polyp of corpus uteri: Secondary | ICD-10-CM | POA: Diagnosis not present

## 2020-11-07 DIAGNOSIS — D252 Subserosal leiomyoma of uterus: Secondary | ICD-10-CM | POA: Diagnosis not present

## 2020-11-07 NOTE — Progress Notes (Signed)
Brenda Cobb 11-May-1945 [id number]        74 y.o.  G0P0   RP: Preop HSC/Myosure Excision/D+C  HPI:  No change x last visit.  Postmenopause, well on no HRT.  No PMB.  Asymptomatic with no abdominal or pelvic pain.  Incidental fining of a large 12.5 cm complex pelvic mass by MRI July 02, 2020, Pelvic US 09/04/2020 showed a large Fibroid and a thickened Endometrial line.   OB History  Gravida Para Term Preterm AB Living  0            SAB IAB Ectopic Multiple Live Births               Past medical history,surgical history, problem list, medications, allergies, family history and social history were all reviewed and documented in the EPIC chart.   Directed ROS with pertinent positives and negatives documented in the history of present illness/assessment and plan.  Exam:  Vitals:   11/07/20 1057  BP: 104/76  Pulse: 74  Resp: 16  Weight: 100 lb (45.4 kg)  Height: '5\' 3"'$  (1.6 m)   General appearance:  Normal     [facility] Infusion Hysterogram ( procedure note)     The initial transvaginal ultrasound demonstrated the following: T/V images and T/a images.  Anteverted uterus distorted to left pelvis due to a large right pedunculated fibroid measured at 11.7 x 10 x 11 cm.  The overall uterine size excluding the fibroid is measured at 7.5 x 2.3 x 5.08 cm.  Thickened endometrium with cystic spaces.  The endometrial lining is measured at 7.86 mm.  Both ovaries previously seen on June 27's 2022 scan.  Today's scan showing a normal right ovary and the left ovary was not seen.  No adnexal mass seen.  Moderate clear free fluid in the right adnexa.   The speculum  was inserted and the cervix cleansed with Betadine solution after confirming that patient has no allergies.A small sonohysterography catheterwas utilized.  Insertion was facilitated with ring forceps, using a spear-like motion the catheter was inserted to the fundus of the uterus. The speculum is then removed carefully to avoid  dislodging the catheter. The catheter was flushed with sterile saline delete prior to insertion to rid it of small amounts of air.the sterile saline solution was infused into the uterine cavity as a vaginal ultrasound probe was then placed in the vagina for full visualization of the uterine cavity from a transvaginal approach. The following was noted: Sonohysterogram performed using 0.9% normal saline.  After normal saline was introduced into the endometrial cavity, at least 3 filling defects were seen within the endometrium and at least 1 filling defect within the cervix.   The catheter was then removed after retrieving some of the saline from the intrauterine cavity. An endometrial biopsy was not done. Patient tolerated procedure well. She had received a tablet of Aleve for discomfort.       Assessment/Plan:  75 y.o. G0P0    1. Thickened endometrium Thickened endometrium probably due to endometrial polyps.   2. Endometrial polyp 3 filling defects in the endometrium and 1 filling defect in the cervix visualized with sonohysterogram.  Probably correspond to polyps.  Decision to proceed with hysteroscopy with MyoSure excisions and D&C.  Information given and pamphlet given.  Surgery and risks reviewed thoroughly.  Preop preparation and post op expectations and precautions also reviewed.  Patient voiced understanding and agreement.   3. Pelvic mass  Pedunculated fibroid which is asymptomatic.  Stable since first seen by CT scan.  Decision to observe.    Princess Bruins MD, 11:23 AM 11/07/2020

## 2020-11-12 ENCOUNTER — Telehealth: Payer: Self-pay | Admitting: Cardiovascular Disease

## 2020-11-12 ENCOUNTER — Telehealth: Payer: Self-pay | Admitting: *Deleted

## 2020-11-12 NOTE — Telephone Encounter (Signed)
-----   Message from Burnard Leigh, RN sent at 11/12/2020 11:30 AM EDT ----- W[facility] Fillers We received via fax a clearance for this pt from your office.  However, this is a clearance from pt's endocrinologist, dr balan, for parathyroid surgery.  Pt will need cardiac clearance from dr berry. She is update to day with office visit 08-20-2020.  Pt's surgery is 11-18-2020 @ [facility] Thank you! Langley Gauss

## 2020-11-12 NOTE — Telephone Encounter (Signed)
   [facility] Pre-operative Risk Assessment    Request for surgical clearance:  What type of surgery is being performed?Hysterectomy D&C myosure   When is this surgery scheduled? 11/18/20   What type of clearance is required (medical clearance vs. Pharmacy clearance to hold med vs. Both)? meidcal  Are there any medications that need to be held prior to surgery and how long?no   Practice name and name of physician performing surgery? DR. Dellis Filbert   What is your office phone number [phone]    7.   What is your office fax number [phone]  8.   Anesthesia type (None, local, MAC, general) ? general   Brenda Cobb 11/12/2020, 12:26 PM  _________________________________________________________________   (provider comments below)

## 2020-11-12 NOTE — Telephone Encounter (Signed)
   Name: Brenda Cobb  DOB: Dec 07, 1945  MRN: [id number]   Primary Cardiologist: Quay Burow, MD  Chart reviewed as part of pre-operative protocol coverage. Patient was contacted 11/12/2020 in reference to pre-operative risk assessment for pending surgery as outlined below.  Brenda Cobb was last seen on 08/2020 by Dr. Gwenlyn Found.  Since that day, Brenda Cobb has done well.  She has severe mitral regurgitation and is being evaluated by CT surgery. She is asymptomatic and currently walks three miles daily without symptoms.   Therefore, based on ACC/AHA guidelines, the patient would be at acceptable risk for the planned procedure without further cardiovascular testing.   The patient was advised that if she develops new symptoms prior to surgery to contact our office to arrange for a follow-up visit, and she verbalized understanding.  I will route this recommendation to the requesting party via Epic fax function and remove from pre-op pool. Please call with questions.  Tami Lin Ludell Zacarias, PA 11/12/2020, 5:18 PM

## 2020-11-12 NOTE — Telephone Encounter (Signed)
Call placed to [facility]/ Dr. Gwenlyn Found.  Spoke with [employer].  Cardiac Clearance Requested for surgery scheduled for 11/18/20.

## 2020-11-13 ENCOUNTER — Encounter (HOSPITAL_BASED_OUTPATIENT_CLINIC_OR_DEPARTMENT_OTHER): Payer: Self-pay | Admitting: Obstetrics & Gynecology

## 2020-11-13 ENCOUNTER — Other Ambulatory Visit: Payer: Self-pay

## 2020-11-13 NOTE — Progress Notes (Addendum)
ADDENDUM:  chart reviewed w/ anesthesia, Dr Roanna Banning MDA,  stated ok to proceed.   Spoke w/ via phone for pre-op interview--- pt Lab needs dos----  cbc, istat             Lab results------ current ekg in epic chart COVID test -----patient states asymptomatic no test needed Arrive at ------- 0800 on 11-18-2020 NPO after MN NO Solid Food.  Clear liquids from MN until--- 0700 Med rec completed Medications to take morning of surgery ----- Synthroid Diabetic medication ----- n/a Patient instructed no nail polish to be worn day of surgery Patient instructed to bring photo id and insurance card day of surgery Patient aware to have Driver (ride ) / caregiver  for 24 hours after surgery --friend, English as a second language teacher Patient Special Instructions ----- n/a Pre-Op special Istructions ----- pt has telephone cardiac clearance by Fabian Sharp PA on 11-13-2020, in epic/ chart Patient verbalized understanding of instructions that were given at this phone interview. Patient denies shortness of breath, chest pain, fever, cough at this phone interview.    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   PCP:  dr Dellis Filbert Cardiologist : Dr Gwenlyn Found (lov 08-20-2020 epic) Chest x-ray :  EKG : 05-23-2020 epic Echo : 05-21-2020 epic Cardiac MRI:  07-02-2020 epic Stress test: no Cardiac Cath : no  Blood Thinner/ Instructions Maryjane Hurter Dose: no ASA / Instructions/ Last Dose :  ASA '81mg'$ /  Per pt was told by dr Dellis Filbert office to stop prior to surgery, last dose 11-10-2020

## 2020-11-13 NOTE — Telephone Encounter (Signed)
Ledora Bottcher, PA     5:18 PM Note   Name: Brenda Cobb  DOB: 1946-02-24  MRN: [id number]    Primary Cardiologist: Quay Burow, MD   Chart reviewed as part of pre-operative protocol coverage. Patient was contacted 11/12/2020 in reference to pre-operative risk assessment for pending surgery as outlined below.  DONYA GOLDIE was last seen on 08/2020 by Dr. Gwenlyn Found.  Since that day, MARLIENE GIFFEN has done well.  She has severe mitral regurgitation and is being evaluated by CT surgery. She is asymptomatic and currently walks three miles daily without symptoms.    Therefore, based on ACC/AHA guidelines, the patient would be at acceptable risk for the planned procedure without further cardiovascular testing.    The patient was advised that if she develops new symptoms prior to surgery to contact our office to arrange for a follow-up visit, and she verbalized understanding.   I will route this recommendation to the requesting party via Epic fax function and remove from pre-op pool. Please call with questions.   Tami Lin Duke, PA 11/12/2020, 5:18 PM        [facility] PAT notified of Clearance.  Routing to Dr. Ileene Musa.  Encounter closed.

## 2020-11-18 ENCOUNTER — Encounter (HOSPITAL_BASED_OUTPATIENT_CLINIC_OR_DEPARTMENT_OTHER)
Admission: RE | Disposition: A | Discharge status: Home / Self Care | Payer: Self-pay | Source: Home / Self Care | Attending: Obstetrics & Gynecology

## 2020-11-18 ENCOUNTER — Encounter (HOSPITAL_BASED_OUTPATIENT_CLINIC_OR_DEPARTMENT_OTHER): Payer: Self-pay | Admitting: Obstetrics & Gynecology

## 2020-11-18 ENCOUNTER — Ambulatory Visit (HOSPITAL_BASED_OUTPATIENT_CLINIC_OR_DEPARTMENT_OTHER): Payer: Medicare Other | Admitting: Anesthesiology

## 2020-11-18 ENCOUNTER — Other Ambulatory Visit: Payer: Self-pay

## 2020-11-18 ENCOUNTER — Ambulatory Visit (HOSPITAL_BASED_OUTPATIENT_CLINIC_OR_DEPARTMENT_OTHER)
Admission: RE | Admit: 2020-11-18 | Discharge: 2020-11-18 | Disposition: A | Discharge status: Home / Self Care | Payer: Medicare Other | Attending: Obstetrics & Gynecology | Admitting: Obstetrics & Gynecology

## 2020-11-18 DIAGNOSIS — I082 Rheumatic disorders of both aortic and tricuspid valves: Secondary | ICD-10-CM | POA: Insufficient documentation

## 2020-11-18 DIAGNOSIS — D259 Leiomyoma of uterus, unspecified: Secondary | ICD-10-CM | POA: Diagnosis not present

## 2020-11-18 DIAGNOSIS — E210 Primary hyperparathyroidism: Secondary | ICD-10-CM | POA: Insufficient documentation

## 2020-11-18 DIAGNOSIS — Z8249 Family history of ischemic heart disease and other diseases of the circulatory system: Secondary | ICD-10-CM | POA: Insufficient documentation

## 2020-11-18 DIAGNOSIS — Z8616 Personal history of COVID-19: Secondary | ICD-10-CM | POA: Insufficient documentation

## 2020-11-18 DIAGNOSIS — Z803 Family history of malignant neoplasm of breast: Secondary | ICD-10-CM | POA: Diagnosis not present

## 2020-11-18 DIAGNOSIS — Z792 Long term (current) use of antibiotics: Secondary | ICD-10-CM | POA: Insufficient documentation

## 2020-11-18 DIAGNOSIS — Z79899 Other long term (current) drug therapy: Secondary | ICD-10-CM | POA: Insufficient documentation

## 2020-11-18 DIAGNOSIS — Z7982 Long term (current) use of aspirin: Secondary | ICD-10-CM | POA: Insufficient documentation

## 2020-11-18 DIAGNOSIS — N840 Polyp of corpus uteri: Secondary | ICD-10-CM | POA: Insufficient documentation

## 2020-11-18 DIAGNOSIS — R9389 Abnormal findings on diagnostic imaging of other specified body structures: Secondary | ICD-10-CM | POA: Insufficient documentation

## 2020-11-18 DIAGNOSIS — Z820 Family history of epilepsy and other diseases of the nervous system: Secondary | ICD-10-CM | POA: Diagnosis not present

## 2020-11-18 DIAGNOSIS — N8501 Benign endometrial hyperplasia: Secondary | ICD-10-CM | POA: Diagnosis not present

## 2020-11-18 HISTORY — DX: Nonrheumatic mitral (valve) insufficiency: I34.0

## 2020-11-18 HISTORY — DX: Nonrheumatic mitral (valve) prolapse: I34.1

## 2020-11-18 HISTORY — DX: Essential (primary) hypertension: I10

## 2020-11-18 HISTORY — DX: Age-related osteoporosis without current pathological fracture: M81.0

## 2020-11-18 HISTORY — DX: Postprocedural hypothyroidism: E89.0

## 2020-11-18 HISTORY — PX: DILATATION & CURETTAGE/HYSTEROSCOPY WITH MYOSURE: SHX6511

## 2020-11-18 HISTORY — DX: Polyp of corpus uteri: N84.0

## 2020-11-18 HISTORY — DX: Presence of spectacles and contact lenses: Z97.3

## 2020-11-18 HISTORY — DX: Primary hyperparathyroidism: E21.0

## 2020-11-18 LAB — CBC
HCT: 40.7 % (ref 36.0–46.0)
Hemoglobin: 13.7 g/dL (ref 12.0–15.0)
MCH: 30.9 pg (ref 26.0–34.0)
MCHC: 33.7 g/dL (ref 30.0–36.0)
MCV: 91.9 fL (ref 80.0–100.0)
Platelets: 236 10*3/uL (ref 150–400)
RBC: 4.43 MIL/uL (ref 3.87–5.11)
RDW: 12.9 % (ref 11.5–15.5)
WBC: 4 10*3/uL (ref 4.0–10.5)
nRBC: 0 % (ref 0.0–0.2)

## 2020-11-18 LAB — POCT I-STAT, CHEM 8
BUN: 27 mg/dL — ABNORMAL HIGH (ref 8–23)
Calcium, Ion: 1.44 mmol/L — ABNORMAL HIGH (ref 1.15–1.40)
Chloride: 105 mmol/L (ref 98–111)
Creatinine, Ser: 1 mg/dL (ref 0.44–1.00)
Glucose, Bld: 91 mg/dL (ref 70–99)
HCT: 40 % (ref 36.0–46.0)
Hemoglobin: 13.6 g/dL (ref 12.0–15.0)
Potassium: 4.5 mmol/L (ref 3.5–5.1)
Sodium: 142 mmol/L (ref 135–145)
TCO2: 28 mmol/L (ref 22–32)

## 2020-11-18 SURGERY — DILATATION & CURETTAGE/HYSTEROSCOPY WITH MYOSURE
Anesthesia: General | Site: Vagina

## 2020-11-18 MED ORDER — ONDANSETRON HCL 4 MG/2ML IJ SOLN
INTRAMUSCULAR | Status: DC | PRN
  Administered 2020-11-18: 4 mg via INTRAVENOUS

## 2020-11-18 MED ORDER — EPHEDRINE SULFATE-NACL 50-0.9 MG/10ML-% IV SOSY
PREFILLED_SYRINGE | INTRAVENOUS | Status: DC | PRN
  Administered 2020-11-18 (×3): 5 mg via INTRAVENOUS
  Administered 2020-11-18: 10 mg via INTRAVENOUS

## 2020-11-18 MED ORDER — DEXAMETHASONE SODIUM PHOSPHATE 10 MG/ML IJ SOLN
INTRAMUSCULAR | Status: DC | PRN
  Administered 2020-11-18: 4 mg via INTRAVENOUS

## 2020-11-18 MED ORDER — PROPOFOL 10 MG/ML IV BOLUS
INTRAVENOUS | Status: AC
  Filled 2020-11-18: qty 20

## 2020-11-18 MED ORDER — DEXAMETHASONE SODIUM PHOSPHATE 10 MG/ML IJ SOLN
INTRAMUSCULAR | Status: AC
  Filled 2020-11-18: qty 1

## 2020-11-18 MED ORDER — LIDOCAINE HCL 1 % IJ SOLN
INTRAMUSCULAR | Status: DC | PRN
  Administered 2020-11-18: 10 mL

## 2020-11-18 MED ORDER — POVIDONE-IODINE 10 % EX SWAB: 2.0000 "application " | Freq: Once | CUTANEOUS | Status: DC

## 2020-11-18 MED ORDER — LIDOCAINE 2% (20 MG/ML) 5 ML SYRINGE
INTRAMUSCULAR | Status: AC
  Filled 2020-11-18: qty 5

## 2020-11-18 MED ORDER — OXYCODONE HCL 5 MG PO TABS: 5.0000 mg | ORAL_TABLET | Freq: Once | ORAL | Status: DC | PRN

## 2020-11-18 MED ORDER — LIDOCAINE 2% (20 MG/ML) 5 ML SYRINGE
INTRAMUSCULAR | Status: DC | PRN
  Administered 2020-11-18: 40 mg via INTRAVENOUS

## 2020-11-18 MED ORDER — CEFAZOLIN SODIUM-DEXTROSE 2-4 GM/100ML-% IV SOLN
INTRAVENOUS | Status: AC
  Filled 2020-11-18: qty 100

## 2020-11-18 MED ORDER — PROPOFOL 10 MG/ML IV BOLUS
INTRAVENOUS | Status: DC | PRN
  Administered 2020-11-18: 100 mg via INTRAVENOUS

## 2020-11-18 MED ORDER — LACTATED RINGERS IV SOLN: INTRAVENOUS | Status: DC

## 2020-11-18 MED ORDER — OXYCODONE HCL 5 MG/5ML PO SOLN: 5.0000 mg | Freq: Once | ORAL | Status: DC | PRN

## 2020-11-18 MED ORDER — FENTANYL CITRATE (PF) 100 MCG/2ML IJ SOLN
INTRAMUSCULAR | Status: DC | PRN
  Administered 2020-11-18 (×2): 50 ug via INTRAVENOUS

## 2020-11-18 MED ORDER — FENTANYL CITRATE (PF) 100 MCG/2ML IJ SOLN: 25.0000 ug | INTRAMUSCULAR | Status: DC | PRN

## 2020-11-18 MED ORDER — CEFAZOLIN SODIUM-DEXTROSE 2-4 GM/100ML-% IV SOLN
2.0000 g | INTRAVENOUS | Status: AC
  Administered 2020-11-18: 2 g via INTRAVENOUS

## 2020-11-18 MED ORDER — FENTANYL CITRATE (PF) 100 MCG/2ML IJ SOLN
INTRAMUSCULAR | Status: AC
  Filled 2020-11-18: qty 2

## 2020-11-18 MED ORDER — ONDANSETRON HCL 4 MG/2ML IJ SOLN
INTRAMUSCULAR | Status: AC
  Filled 2020-11-18: qty 2

## 2020-11-18 MED ORDER — EPHEDRINE 5 MG/ML INJ
INTRAVENOUS | Status: AC
  Filled 2020-11-18: qty 10

## 2020-11-18 MED ORDER — ONDANSETRON HCL 4 MG/2ML IJ SOLN: 4.0000 mg | Freq: Once | INTRAMUSCULAR | Status: DC | PRN

## 2020-11-18 SURGICAL SUPPLY — 20 items
CATH ROBINSON RED A/P 16FR (CATHETERS) IMPLANT
DEVICE MYOSURE LITE (MISCELLANEOUS) IMPLANT
DEVICE MYOSURE REACH (MISCELLANEOUS) ×2 IMPLANT
DILATOR CANAL MILEX (MISCELLANEOUS) IMPLANT
ELECT REM PT RETURN 9FT ADLT (ELECTROSURGICAL)
ELECTRODE REM PT RTRN 9FT ADLT (ELECTROSURGICAL) IMPLANT
GAUZE 4X4 16PLY ~~LOC~~+RFID DBL (SPONGE) ×2 IMPLANT
GLOVE SURG ENC MOIS LTX SZ6.5 (GLOVE) ×2 IMPLANT
GLOVE SURG UNDER POLY LF SZ7 (GLOVE) ×4 IMPLANT
GOWN STRL REUS W/TWL LRG LVL3 (GOWN DISPOSABLE) ×4 IMPLANT
IV NS IRRIG 3000ML ARTHROMATIC (IV SOLUTION) ×2 IMPLANT
KIT PROCEDURE FLUENT (KITS) ×2 IMPLANT
KIT TURNOVER CYSTO (KITS) ×2 IMPLANT
MYOSURE XL FIBROID (MISCELLANEOUS)
PACK VAGINAL MINOR WOMEN LF (CUSTOM PROCEDURE TRAY) ×2 IMPLANT
PAD OB MATERNITY 4.3X12.25 (PERSONAL CARE ITEMS) ×2 IMPLANT
PAD PREP 24X48 CUFFED NSTRL (MISCELLANEOUS) ×2 IMPLANT
SEAL CERVICAL OMNI LOK (ABLATOR) IMPLANT
SEAL ROD LENS SCOPE MYOSURE (ABLATOR) ×2 IMPLANT
SYSTEM TISS REMOVAL MYOSURE XL (MISCELLANEOUS) IMPLANT

## 2020-11-18 NOTE — Anesthesia Postprocedure Evaluation (Signed)
Anesthesia Post Note  Patient: Brenda Cobb  Procedure(s) Performed: DILATATION & CURETTAGE/HYSTEROSCOPY WITH MYOSURE (Vagina )     Patient location during evaluation: PACU Anesthesia Type: General Level of consciousness: awake and alert Pain management: pain level controlled Vital Signs Assessment: post-procedure vital signs reviewed and stable Respiratory status: spontaneous breathing, nonlabored ventilation and respiratory function stable Cardiovascular status: stable and blood pressure returned to baseline Anesthetic complications: no   No notable events documented.  Last Vitals:  Vitals:   11/18/20 1049 11/18/20 1100  BP: (!) 143/75 131/66  Pulse: 95 69  Resp: 18 13  Temp: (!) 36.3 C (!) 36.3 C  SpO2: 100% 99%    Last Pain:  Vitals:   11/18/20 1100  TempSrc:   PainSc: [place]

## 2020-11-18 NOTE — Discharge Instructions (Addendum)
DISCHARGE INSTRUCTIONS: D&C The following instructions have been prepared to help you care for yourself upon your return home.   Personal hygiene:  Use sanitary pads for vaginal drainage, not tampons.  Shower the day after your procedure.  NO tub baths, pools or Jacuzzis for 2-3 weeks.  Wipe front to back after using the bathroom.  Activity and limitations:  Do NOT drive or operate any equipment for 24 hours. The effects of anesthesia are still present and drowsiness may result.  Do NOT rest in bed all day.  Walking is encouraged.  Walk up and down stairs slowly.  You may resume your normal activity in one to two days or as indicated by your physician.  Sexual activity: NO intercourse for at least 2 weeks after the procedure, or as indicated by your physician.  Diet: Eat a light meal as desired this evening. You may resume your usual diet tomorrow.  Return to work: You may resume your work activities in one to two days or as indicated by your doctor.  What to expect after your surgery: Expect to have vaginal bleeding/discharge for 2-3 days and spotting for up to 10 days. It is not unusual to have soreness for up to 1-2 weeks. You may have a slight burning sensation when you urinate for the first day. Mild cramps may continue for a couple of days. You may have a regular period in 2-6 weeks.  Call your doctor for any of the following:  Excessive vaginal bleeding, saturating and changing one pad every hour.  Inability to urinate 6 hours after discharge from hospital.  Pain not relieved by pain medication.  Fever of 100.4 F or greater.  Unusual vaginal discharge or odor.   Post Anesthesia Home Care Instructions  Activity: Get plenty of rest for the remainder of the day. A responsible individual must stay with you for 24 hours following the procedure.  For the next 24 hours, DO NOT: -Drive a car -Paediatric nurse -Drink alcoholic beverages -Take any medication unless instructed  by your physician -Make any legal decisions or sign important papers.  Meals: Start with liquid foods such as gelatin or soup. Progress to regular foods as tolerated. Avoid greasy, spicy, heavy foods. If nausea and/or vomiting occur, drink only clear liquids until the nausea and/or vomiting subsides. Call your physician if vomiting continues.  Special Instructions/Symptoms: Your throat may feel dry or sore from the anesthesia or the breathing tube placed in your throat during surgery. If this causes discomfort, gargle with warm salt water. The discomfort should disappear within 24 hours.

## 2020-11-18 NOTE — Op Note (Signed)
Operative Note  11/18/2020  10:56 AM  PATIENT:  Brenda Cobb  75 y.o. female  PRE-OPERATIVE DIAGNOSIS:  Endometrial/ Endocervical polyps/Lesions  POST-OPERATIVE DIAGNOSIS:  Endometrial/ Endocervical polyps/Lesions  PROCEDURE:  Procedure(s): DILATATION & CURETTAGE/HYSTEROSCOPY WITH MYOSURE EXCISIONS  SURGEON:  Surgeon(s): Princess Bruins, MD  ANESTHESIA:   general  FINDINGS: Endometrial and Endocervical lesions c/w Polyps  DESCRIPTION OF OPERATION: Under general anesthesia with laryngeal mask, the patient is in lithotomy position.  She is prepped with Betadine on the supra pubic, vulvar and vaginal areas.  The bladder is catheterized.  The patient is draped as usual.  Timeout is done.  The vaginal exam reveals a very hard enlarged uterus compatible with a known stable fibroid.  The uterus with fibroid is mobile.  No adnexal masses felt.  The speculum is inserted into the vagina and the anterior lip of the cervix is grasped with a tenaculum.  A paracervical block is done with lidocaine 1% a total of 10 cc at 4 and 8:00.  Dilation of the cervix with Pratt dilators up to #19 without difficulty.  The hysterometry is at 7 cm.  Insertion of the hysteroscope.  Inspection of the intra uterine cavity.  Both ostia are well visualized.  The endometrium appears slightly thickened posteriorly.  2 intra uterine lesions are present.  The larger 1 is left posterior lower uterine segment and endocervical.  The other lesion is smaller at the right anterior lower uterine segment.  Both lesions are polyp he with a mild increase in vascularity.  The MyoSure reach is used to completely excise those 2 lesions.  Excision of the slightly thickened posterior endometrium is also done with the MyoSure.  Pictures were taken before and after excisions.  Hemostasis was adequate at all levels.  The hysteroscope with MyoSure are removed.  We proceeded with a systematic curettage of the intra uterine cavity on all surfaces  with a sharp curette.  The endometrial curettings are sent together with the excision material.  The sharp curette is removed from the uterus.  The tenaculum is removed from the cervix.  Hemostasis is adequate.  The speculum is removed from the vagina.  The patient is brought to recovery room in good and stable status.  ESTIMATED BLOOD LOSS: 5 mL FLUID DEFICIT: 150 mL  Intake/Output Summary (Last 24 hours) at 11/18/2020 1056 Last data filed at 11/18/2020 1040 Gross per 24 hour  Intake 900 ml  Output 5 ml  Net 895 ml     BLOOD ADMINISTERED:none   LOCAL MEDICATIONS USED:  LIDOCAINE 1% 10 cc  SPECIMEN:  Source of Specimen:  Endometrial/Endocervical excision material and Endometrial curetting.  DISPOSITION OF SPECIMEN:  PATHOLOGY  COUNTS:  YES  PLAN OF CARE: Transfer to PACU  Marie-Lyne LavoieMD10:56 AM

## 2020-11-18 NOTE — Anesthesia Procedure Notes (Signed)
Procedure Name: LMA Insertion Date/Time: 11/18/2020 10:14 AM Performed by: Genelle Bal, CRNA Pre-anesthesia Checklist: Patient identified, Emergency Drugs available, Suction available and Patient being monitored Patient Re-evaluated:Patient Re-evaluated prior to induction Oxygen Delivery Method: Circle system utilized Preoxygenation: Pre-oxygenation with 100% oxygen Induction Type: IV induction Ventilation: Mask ventilation without difficulty LMA: LMA inserted LMA Size: 3.0 Number of attempts: 1 Airway Equipment and Method: Bite block Placement Confirmation: positive ETCO2 Tube secured with: Tape Dental Injury: Teeth and Oropharynx as per pre-operative assessment

## 2020-11-18 NOTE — Transfer of Care (Signed)
Immediate Anesthesia Transfer of Care Note  Patient: Brenda Cobb  Procedure(s) Performed: DILATATION & CURETTAGE/HYSTEROSCOPY WITH MYOSURE (Vagina )  Patient Location: PACU  Anesthesia Type:General  Level of Consciousness: awake, alert  and oriented  Airway & Oxygen Therapy: Patient Spontanous Breathing and Patient connected to face mask oxygen  Post-op Assessment: Report given to RN and Post -op Vital signs reviewed and stable  Post vital signs: Reviewed and stable  Last Vitals:  Vitals Value Taken Time  BP 143/75 11/18/20 1048  Temp    Pulse 79 11/18/20 1051  Resp 14 11/18/20 1051  SpO2 100 % 11/18/20 1051  Vitals shown include unvalidated device data.  Last Pain:  Vitals:   11/18/20 0832  TempSrc: Oral  PainSc: 0-No pain         Complications: No notable events documented.

## 2020-11-18 NOTE — Anesthesia Preprocedure Evaluation (Addendum)
Anesthesia Evaluation  Patient identified by MRN, date of birth, ID band Patient awake    Reviewed: Allergy & Precautions, NPO status , Patient's Chart, lab work & pertinent test results  History of Anesthesia Complications Negative for: history of anesthetic complications  Airway Mallampati: II  TM Distance: >3 FB Neck ROM: Full    Dental  (+) Dental Advisory Given, Teeth Intact   Pulmonary neg pulmonary ROS,    Pulmonary exam normal        Cardiovascular hypertension, Pt. on medications + Valvular Problems/Murmurs MVP and MR  Rhythm:Regular Rate:Normal + Systolic murmurs  '22 Cardiac MRI - There is no apparent mass in the left atrium. Side by side comparison performed to echocardiograms from 2020, 2021, and 2022. Lesion in question is adjacent to the right lower pulmonary vein on echo, however on multiple views with thin slice assessment, perfusion, and post contrast images, no comparable lesion is seen on MRI. Suspect this finding on echo may be secondary to artifact or tissue plane.  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 The jet is broad based, eccentric and posteriorly directed and therefore unable to quantify based on PISA. 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 EF 60 to 65%. There is mild asymmetric left ventricular hypertrophy of the basal-septal segment. Grade II diastolic dysfunction (pseudonormalization). Left atrial size was severely dilated. Tricuspid valve regurgitation is mild to moderate. Aortic valve regurgitation is mild. Mild aortic valve sclerosis is present, with no evidence of AS    Neuro/Psych negative neurological ROS  negative psych ROS   GI/Hepatic negative GI ROS, Neg  liver ROS,   Endo/Other  Hypothyroidism  Primary hyperparathyroidism Graves Disease s/p RAI   Renal/GU negative Renal ROS     Musculoskeletal negative musculoskeletal ROS (+)   Abdominal   Peds  Hematology negative hematology ROS (+)   Anesthesia Other Findings Per Cardiology, "patient would be at acceptable risk for the planned procedure without further cardiovascular testing."   Reproductive/Obstetrics                            Anesthesia Physical Anesthesia Plan  ASA: 3  Anesthesia Plan: General   Post-op Pain Management:    Induction: Intravenous  PONV Risk Score and Plan: 3 and Treatment may vary due to age or medical condition, Ondansetron and Dexamethasone  Airway Management Planned: LMA  Additional Equipment: None  Intra-op Plan:   Post-operative Plan: Extubation in OR  Informed Consent: I have reviewed the patients History and Physical, chart, labs and discussed the procedure including the risks, benefits and alternatives for the proposed anesthesia with the patient or authorized representative who has indicated his/her understanding and acceptance.     Dental advisory given  Plan Discussed with: CRNA and Anesthesiologist  Anesthesia Plan Comments:        Anesthesia Quick Evaluation

## 2020-11-18 NOTE — H&P (Signed)
Brenda Cobb is an 75 y.o. female. G0   RP: HSC/Myosure Excision/D+C   HPI:  No change x last visit.  Postmenopause, well on no HRT.  No PMB.  Asymptomatic with no abdominal or pelvic pain.  Incidental fining of a large 12.5 cm complex pelvic mass by MRI July 02, 2020, Pelvic US 09/04/2020 showed a large Fibroid and a thickened Endometrial line.    Pertinent Gynecological History: Menses: post-menopausal Contraception: post menopausal status Blood transfusions: none Sexually transmitted diseases: no past history Last mammogram: normal  Last pap: normal  OB History: G0  Menstrual History: No LMP recorded. Patient is postmenopausal.    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    cardiologist--- dr berry---  long hx of known mvp, requires antibiotic prophylaxis;  last echo in epic 05-21-2020 ef 60-65%, G2DD, bileaflet MV prolapse w/ severe MR, mild-mod TR, severe LAE;  per cardiology note pt is asymptomatic walks several miles daily   Osteoporosis    Primary hyperparathyroidism [facility])    endocrinologist--- dr Chalmers Cater   Wears contact lenses     Past Surgical History:  Procedure Laterality Date   BREAST BIOPSY Left 2018   Benign   BREAST SURGERY Left 1990   Benign breast cyst   COLONOSCOPY     last one 2012 approx    Family History  Problem Relation Age of Onset   Alzheimer's disease Father    Aneurysm Brother    ALS Maternal Grandmother    Parkinson's disease Maternal Grandfather    Breast cancer Paternal Grandmother 42   Breast cancer Maternal Aunt 50    Social History:  reports that she has  never smoked. She has never used smokeless tobacco. She reports current alcohol use. She reports that she does not use drugs.  Allergies: No Known Allergies  Medications Prior to Admission  Medication Sig Dispense Refill Last Dose   amoxicillin (AMOXIL) 500 MG tablet Take 2,000 mg by mouth as directed. Per pt takes prior to dental procedures      aspirin 81 MG tablet Take 1 tablet (81 mg total) by mouth daily. 90 tablet 3 11/09/2020   cetirizine (ZYRTEC) 10 MG tablet Take 10 mg by mouth daily as needed for allergies.      diphenhydrAMINE HCl, Sleep, (ZZZQUIL) 25 MG CAPS Take 25 mg by mouth at bedtime.      fluticasone (FLONASE) 50 MCG/ACT nasal spray Place 1 spray into both nostrils daily as needed for allergies or rhinitis.      irbesartan (AVAPRO) 150 MG tablet TAKE 1 TABLET(150 MG) BY MOUTH DAILY (Patient taking differently: Take 150 mg by mouth daily. TAKE 1 TABLET(150 MG) BY MOUTH DAILY) 90 tablet 3    levothyroxine (SYNTHROID) 88 MCG tablet Take 88 mcg by mouth daily before breakfast.       REVIEW OF SYSTEMS: A ROS was performed and pertinent positives and negatives are included in the history.  GENERAL: No fevers or chills. HEENT: No change in vision, no earache, sore throat or sinus congestion. NECK: No pain or stiffness. CARDIOVASCULAR: No chest  pain or pressure. No palpitations. PULMONARY: No shortness of breath, cough or wheeze. GASTROINTESTINAL: No abdominal pain, nausea, vomiting or diarrhea, melena or bright red blood per rectum. GENITOURINARY: No urinary frequency, urgency, hesitancy or dysuria. MUSCULOSKELETAL: No joint or muscle pain, no back pain, no recent trauma. DERMATOLOGIC: No rash, no itching, no lesions. ENDOCRINE: No polyuria, polydipsia, no heat or cold intolerance. No recent change in weight. HEMATOLOGICAL: No anemia or easy bruising or bleeding. NEUROLOGIC: No headache, seizures, numbness, tingling or weakness. PSYCHIATRIC: No depression, no loss of interest in normal  activity or change in sleep pattern.     Height '5\' 3"'$  (1.6 m), weight 45.4 kg.  Physical Exam:  RCR Lungs clear   Sono Infusion Hysterogram ( procedure note)     The initial transvaginal ultrasound demonstrated the following: T/V images and T/a images.  Anteverted uterus distorted to left pelvis due to a large right pedunculated fibroid measured at 11.7 x 10 x 11 cm.  The overall uterine size excluding the fibroid is measured at 7.5 x 2.3 x 5.08 cm.  Thickened endometrium with cystic spaces.  The endometrial lining is measured at 7.86 mm.  Both ovaries previously seen on June 27's 2022 scan.  Today's scan showing a normal right ovary and the left ovary was not seen.  No adnexal mass seen.  Moderate clear free fluid in the right adnexa.   The speculum  was inserted and the cervix cleansed with Betadine solution after confirming that patient has no allergies.A small sonohysterography catheterwas utilized.  Insertion was facilitated with ring forceps, using a spear-like motion the catheter was inserted to the fundus of the uterus. The speculum is then removed carefully to avoid dislodging the catheter. The catheter was flushed with sterile saline delete prior to insertion to rid it of small amounts of air.the sterile saline solution was infused into the uterine cavity as a vaginal ultrasound probe was then placed in the vagina for full visualization of the uterine cavity from a transvaginal approach. The following was noted: Sonohysterogram performed using 0.9% normal saline.  After normal saline was introduced into the endometrial cavity, at least 3 filling defects were seen within the endometrium and at least 1 filling defect within the cervix.   The catheter was then removed after retrieving some of the saline from the intrauterine cavity. An endometrial biopsy was not done. Patient tolerated procedure well. She had received a tablet of Aleve for discomfort.       Assessment/Plan:  75 y.o.  G0P0    1. Thickened endometrium Thickened endometrium probably due to endometrial polyps.   2. Endometrial polyp 3 filling defects in the endometrium and 1 filling defect in the cervix visualized with sonohysterogram.  Probably correspond to polyps.  Decision to proceed with hysteroscopy with MyoSure excisions and D&C.  Information given and pamphlet given.  Surgery and risks reviewed thoroughly.  Preop preparation and post op expectations and precautions also reviewed.  Patient voiced understanding and agreement.   3. Pelvic mass  Pedunculated fibroid which is asymptomatic.  Stable since first seen by CT scan.  Decision to observe.                         Patient was counseled as to the risk of surgery to include the following:  1. Infection (prohylactic antibiotics will be administered)  2. DVT/Pulmonary Embolism (prophylactic pneumo compression stockings will be used)  3.Trauma to internal organs requiring additional surgical procedure to repair  any injury to internal organs requiring perhaps additional hospitalization days.  4.Hemmorhage requiring transfusion and blood products which carry risks such as anaphylactic reaction, hepatitis and AIDS  Patient had received literature information on the procedure scheduled and all her questions were answered and fully accepts all risk.    Marie-Lyne Kimyetta Flott 11/18/2020, 8:06 AM

## 2020-11-19 ENCOUNTER — Encounter (HOSPITAL_BASED_OUTPATIENT_CLINIC_OR_DEPARTMENT_OTHER): Payer: Self-pay | Admitting: Obstetrics & Gynecology

## 2020-11-19 LAB — SURGICAL PATHOLOGY

## 2020-11-24 ENCOUNTER — Other Ambulatory Visit: Payer: Self-pay | Admitting: Surgery

## 2020-11-24 ENCOUNTER — Other Ambulatory Visit: Payer: Self-pay

## 2020-11-24 DIAGNOSIS — E210 Primary hyperparathyroidism: Secondary | ICD-10-CM

## 2020-11-24 DIAGNOSIS — N8501 Benign endometrial hyperplasia: Secondary | ICD-10-CM

## 2020-11-24 MED ORDER — MEDROXYPROGESTERONE ACETATE 2.5 MG PO TABS: 2.5000 mg | ORAL_TABLET | Freq: Every day | ORAL | 3 refills | Status: DC

## 2020-12-02 ENCOUNTER — Other Ambulatory Visit: Payer: Self-pay | Admitting: Surgery

## 2020-12-02 DIAGNOSIS — E210 Primary hyperparathyroidism: Secondary | ICD-10-CM

## 2020-12-03 ENCOUNTER — Ambulatory Visit
Admission: RE | Admit: 2020-12-03 | Discharge: 2020-12-03 | Disposition: A | Discharge status: Home / Self Care | Payer: Medicare Other | Source: Ambulatory Visit | Attending: Surgery | Admitting: Surgery

## 2020-12-03 DIAGNOSIS — E210 Primary hyperparathyroidism: Secondary | ICD-10-CM

## 2020-12-03 IMAGING — US US THYROID
1 series · 14 of 25 positions shown · non-contrast
Comparison: None.

CLINICAL DATA: Other. History of prior radioactive iodine therapy.
Hyperparathyroidism.

EXAM:
THYROID ULTRASOUND
TECHNIQUE: Ultrasound examination of the thyroid gland and adjacent soft
tissues was performed.

[Series 1: us thyroid · 0.04mm/px · 14 of 33 slices shown]
[im 1/33]
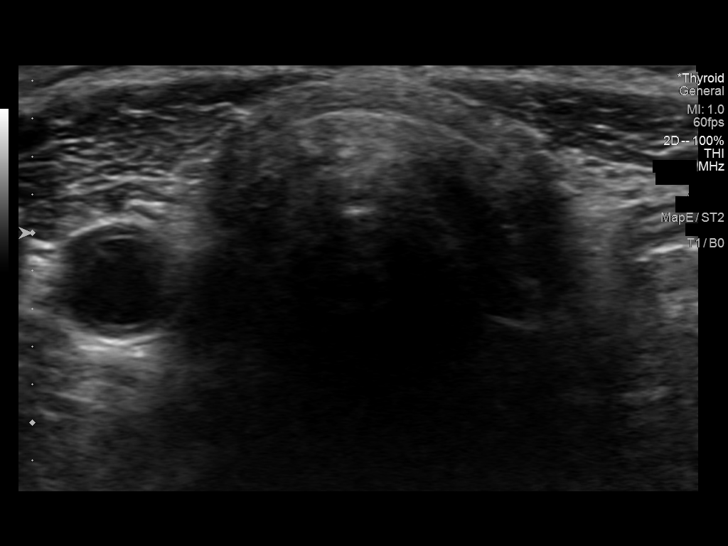
[im 3/33]
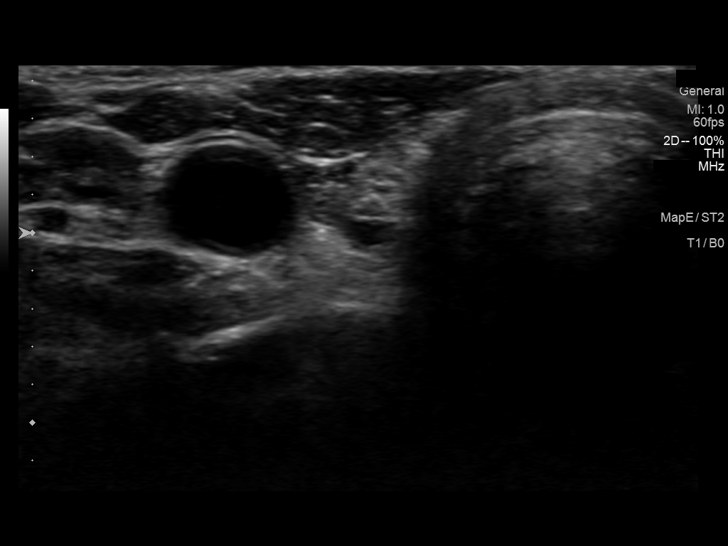
[im 6/33]
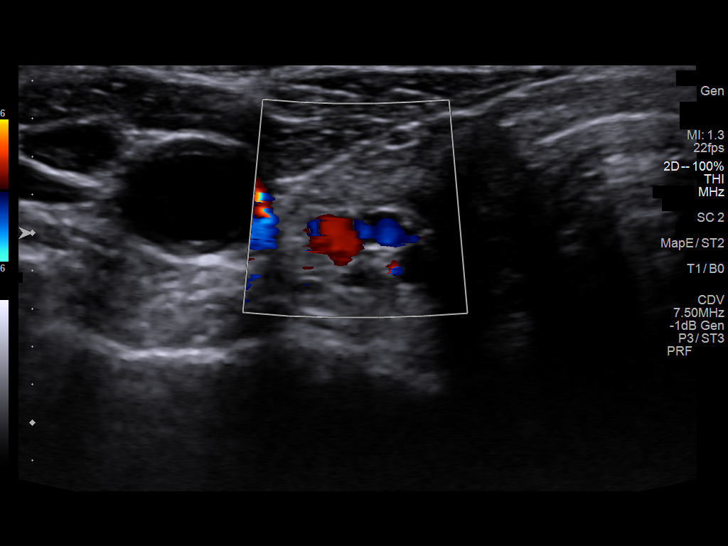
[im 9/33]
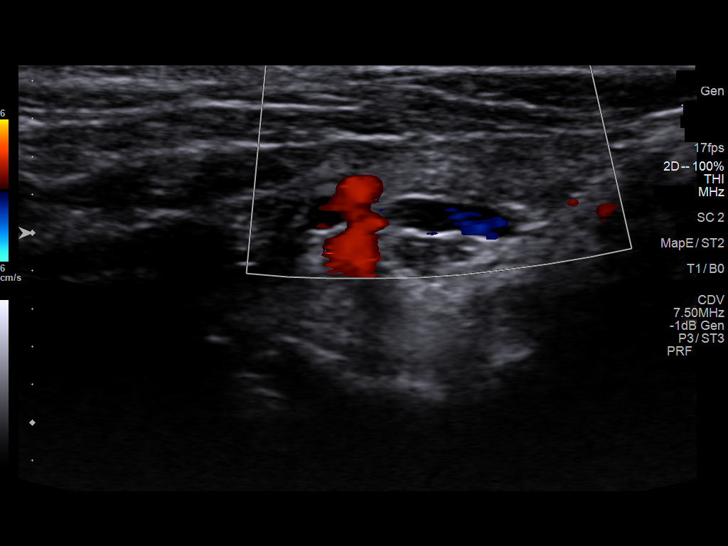
[im 11/33]
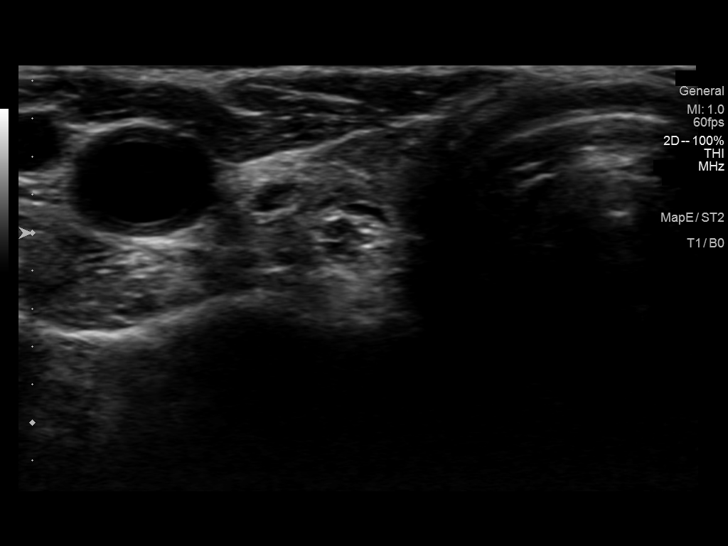
[im 13/33]
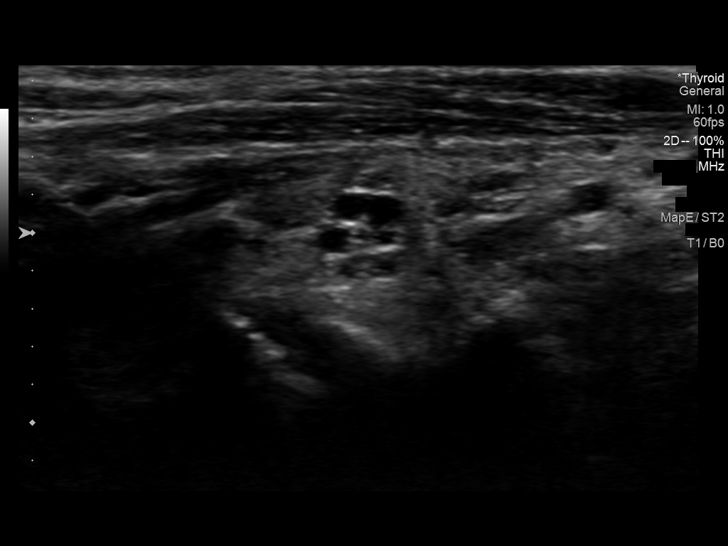
[im 15/33]
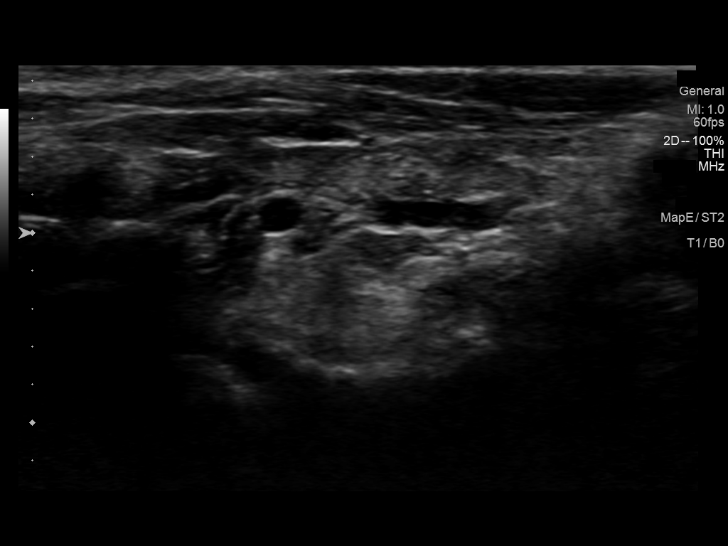
[im 18/33]
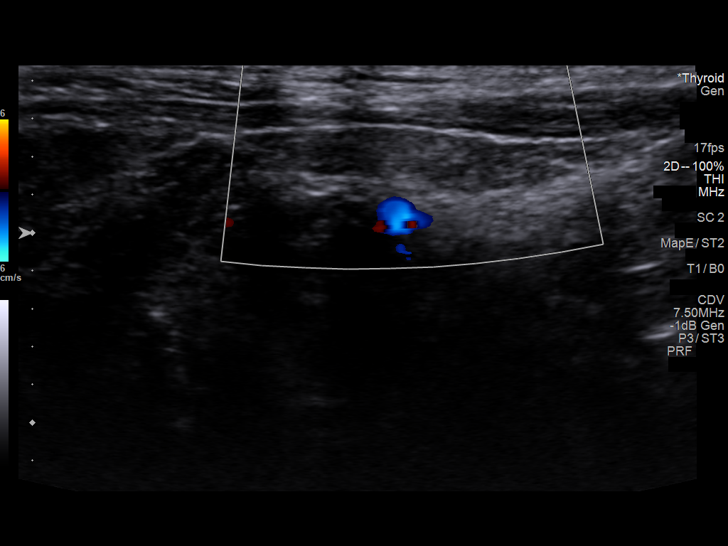
[im 21/33]
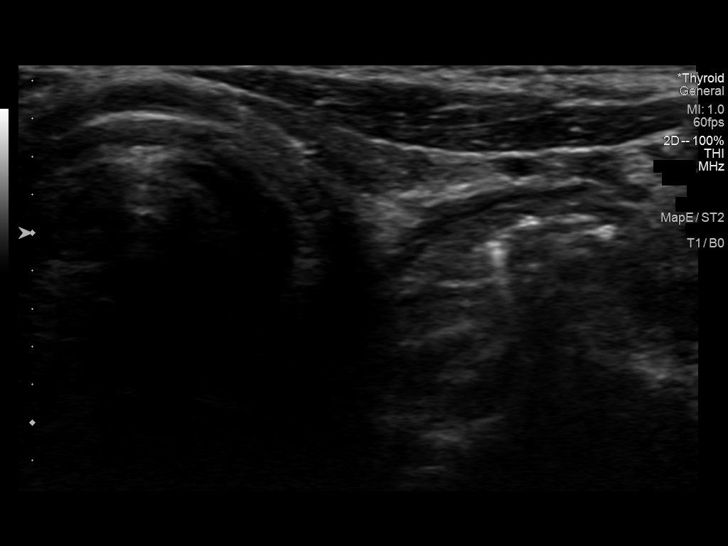
[im 22/33]
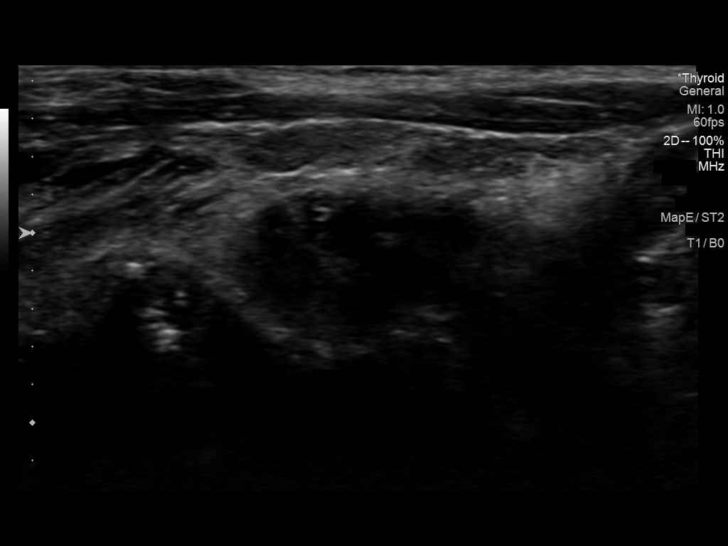
[im 25/33]
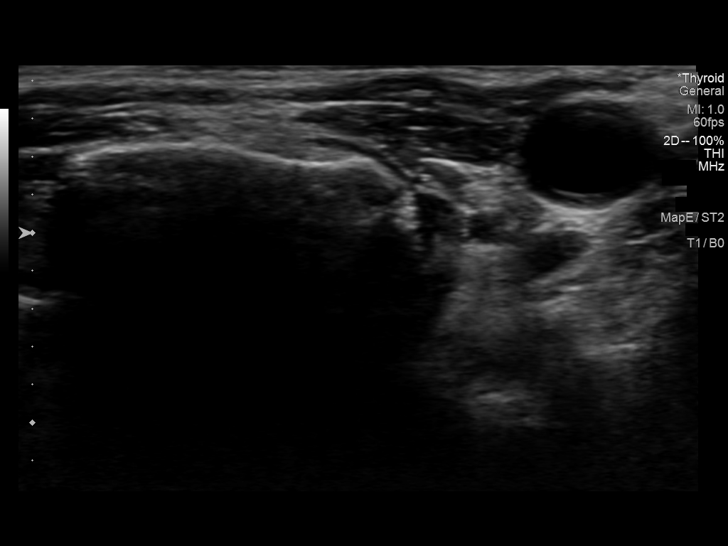
[im 27/33]
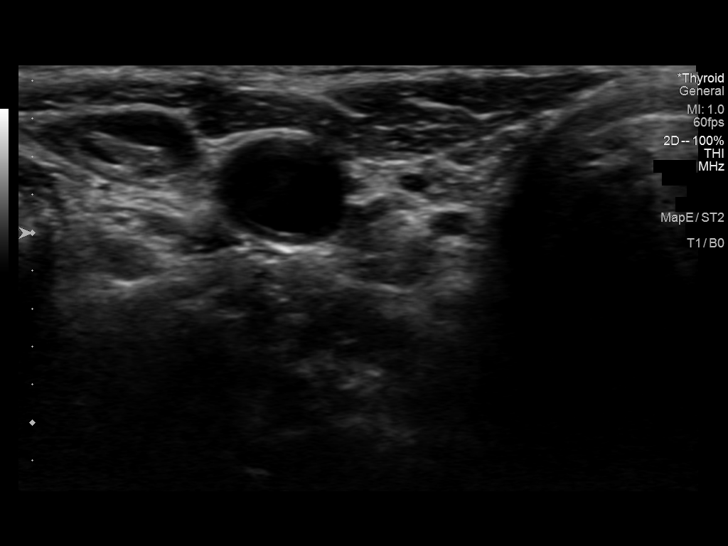
[im 30/33]
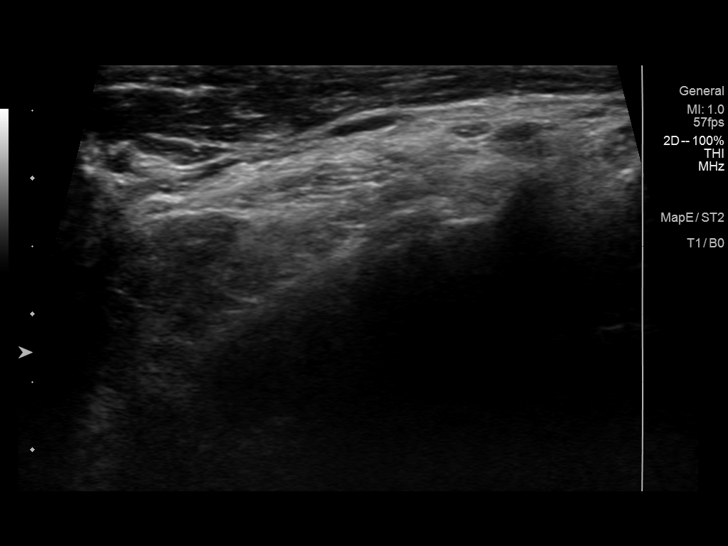
[im 33/33]
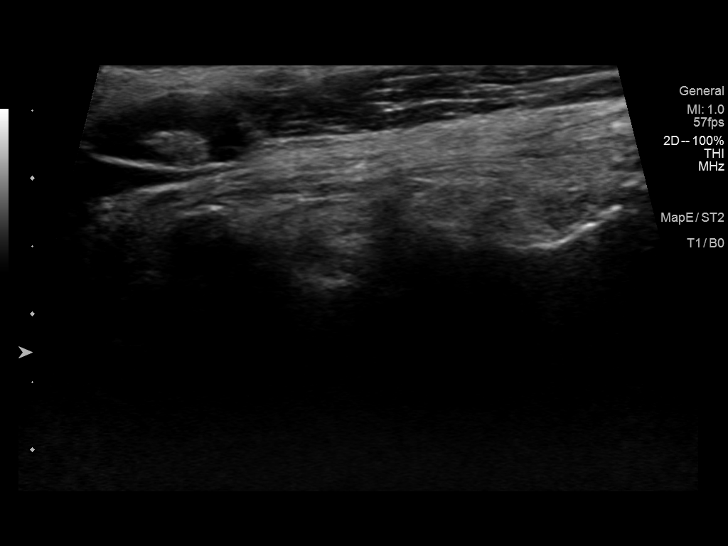

[14 of 25 positions shown; findings below may reference images not displayed]

FINDINGS: Parenchymal Echotexture: Moderately heterogenous

Isthmus: 0.2 cm

Right lobe: 1.5 x 0.3 x 0.7 cm

Left lobe: 1.5 x 0.3 x 0.6 cm

_________________________________________________________

Estimated total number of nodules >/= 1 cm: 0

Number of spongiform nodules >/=  2 cm not described below (TR1): 0

Number of mixed cystic and solid nodules >/= 1.5 cm not described
below (TR2): 0

_________________________________________________________

No discrete nodules are seen within the thyroid gland.
IMPRESSION: Small heterogeneous thyroid gland without evidence of discrete
thyroid nodule. Findings suggest chronic hypothyroidism.

The above is in keeping with the ACR TI-RADS recommendations - [HOSPITAL] [6K];[DATE].

## 2020-12-03 IMAGING — CT CT NECK SOFT TISSUE WO/W CM
4 of 10 series · 10 of 33 positions shown, 11 images · IV contrast (agent unspecified)
Comparison: Nuclear medicine study [DATE]

CLINICAL DATA: Primary hyperparathyroidism. Hypercalcemia. Assess
for parathyroid adenoma. History of Graves disease with radioactive
iodine treatment.

EXAM:
CT NECK WITH AND WITHOUT CONTRAST
TECHNIQUE: Multidetector CT imaging of the neck was performed without and with
intravenous contrast.
CONTRAST:  75 cc [77]

[Series 8: neck arterial 2.00 br40 s3 · coronal · arterial · 0.36mm/px · 1 of 99 slices shown (1 of 2)]
[im 50/99  bone]
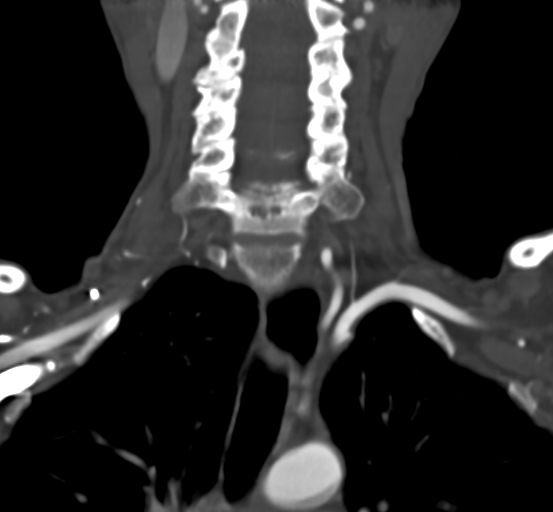

[Series 10: neck arterial 2.00 br40 s3 · sagittal · arterial · 0.36mm/px · 5 of 99 slices shown (2 of 2)]
[im 17/99  bone]
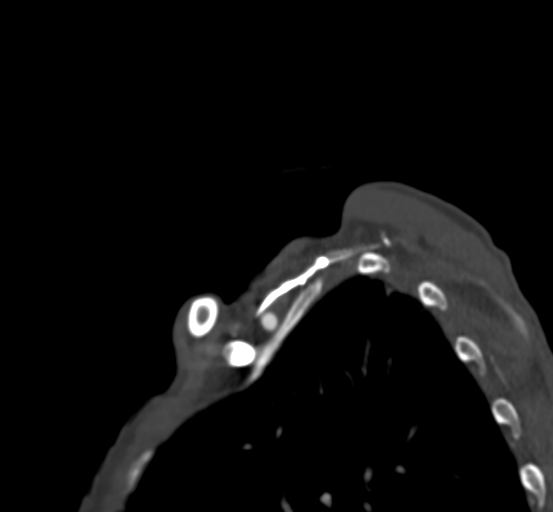
[im 33/99  bone]
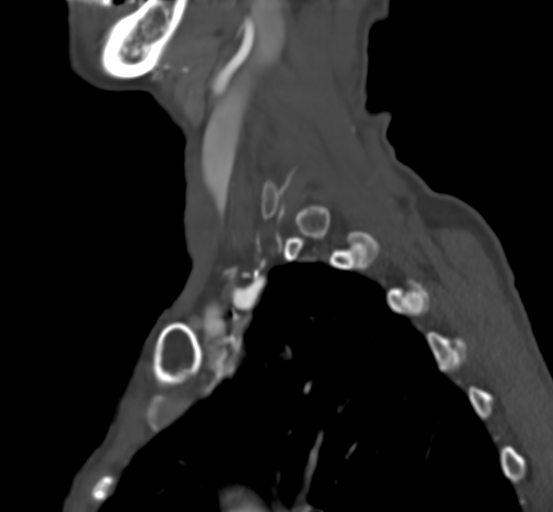
[im 50/99  bone]
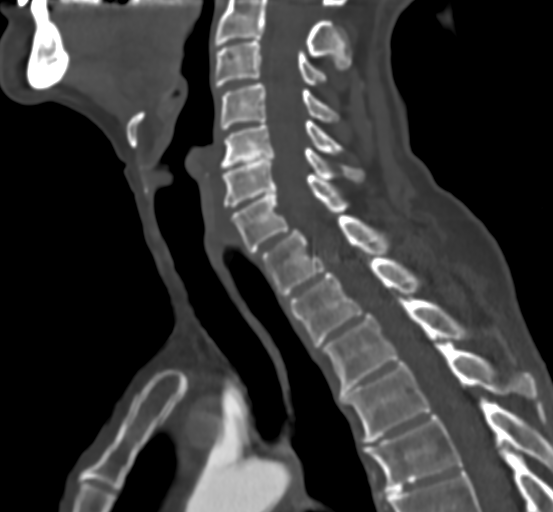
[im 66/99  bone]
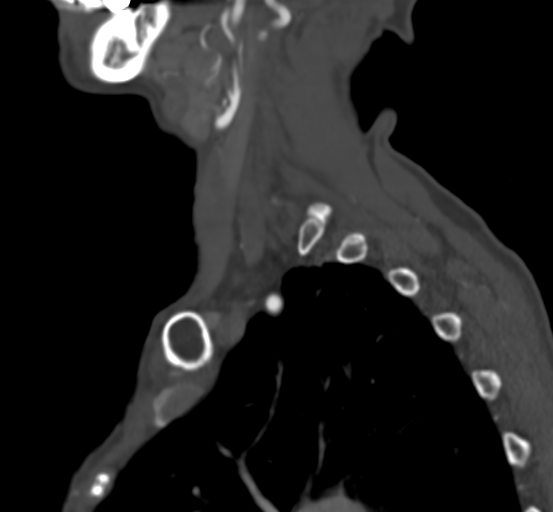
[im 82/99  bone]
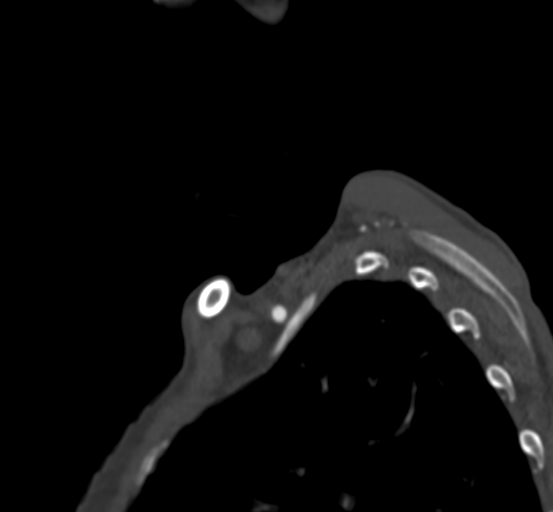

[Series 12: neck arterial 1.00 br40 s3 · axial · arterial · 0.39mm/px · z∈[-742,-681]mm · 2 of 183 slices shown, 3 images]
[im 61/183  soft-tissue]
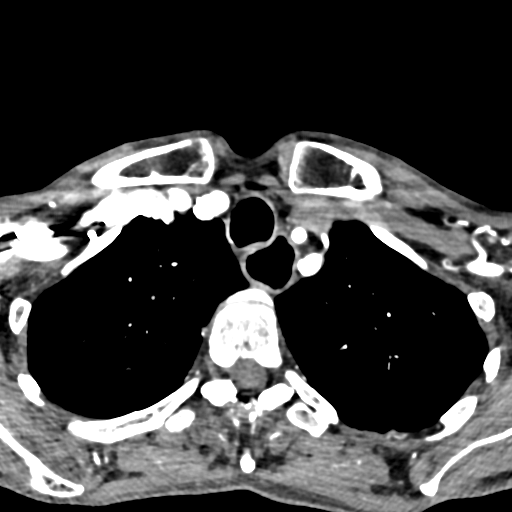
[im 61/183  bone]
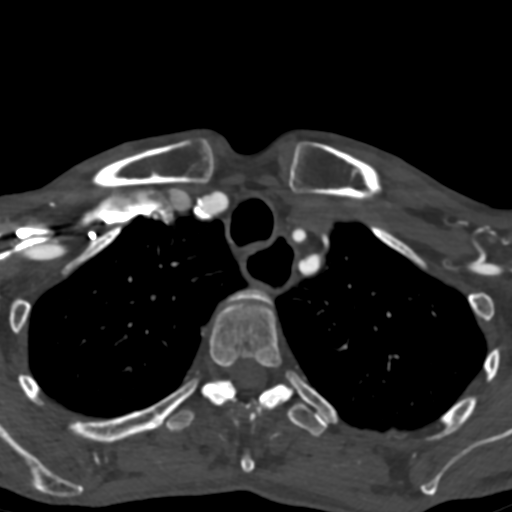
[im 122/183  bone]
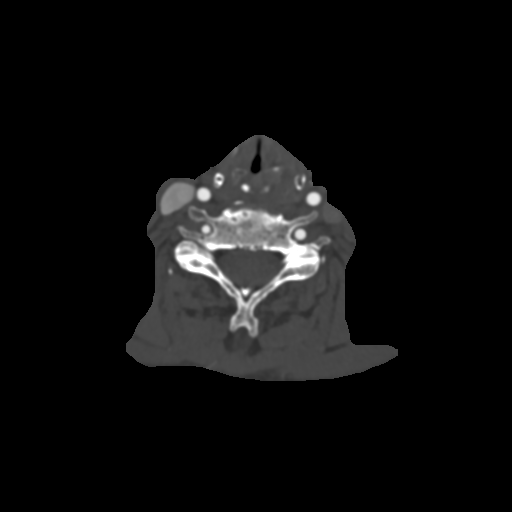

[Series 19: neck venous 1.00 br40 s3 · axial · portal-venous · 0.39mm/px · z∈[-742,-681]mm · 2 of 183 slices shown]
[im 61/183  bone]
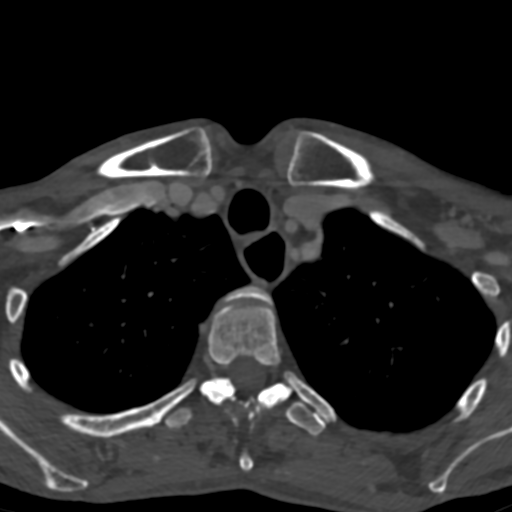
[im 122/183  bone]
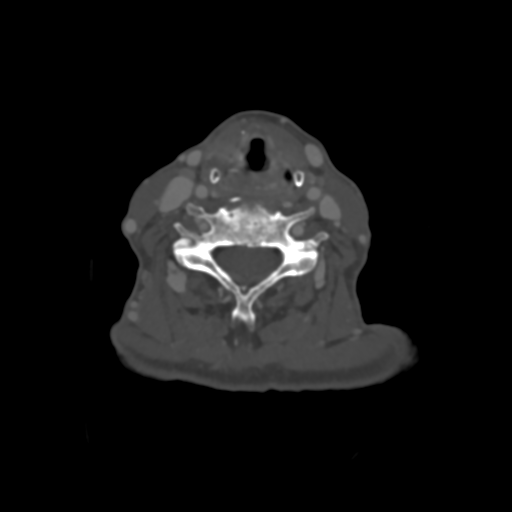

[10 of 33 positions shown; findings below may reference images not displayed]

FINDINGS: Pharynx and larynx: No mucosal or submucosal mass lesion is seen.

Salivary glands: Parotid and submandibular glands are normal.
Parotid glands are not imaged in their entirety.

Thyroid: No identifiable residual thyroid tissue, status post
radioactive iodine treatment. No parathyroid adenoma is identified
on either side. I think I can identify a single parathyroid gland on
each side, but these measure no more than 3-4 mm in size in would
not be characterized as a parathyroid adenoma.

Lymph nodes: No lymphadenopathy on either side of the neck.

Vascular: No abnormal vascular finding.

Limited intracranial: Not included.

Visualized orbits: Not included.

Mastoids and visualized paranasal sinuses: Not included.

Skeleton: Ordinary cervical spondylosis and facet osteoarthritis.

Upper chest: Upper lungs are clear.

Other: None.
IMPRESSION: No identifiable residual thyroid tissue in this patient status post
radioactive iodine treatment. No identifiable parathyroid adenoma. I
think I can identify a single parathyroid gland on both sides,
measuring only 3-4 mm in size and not consistent with adenoma
tissue.

## 2020-12-03 MED ORDER — IOPAMIDOL (ISOVUE-370) INJECTION 76%
75.0000 mL | Freq: Once | INTRAVENOUS | Status: AC | PRN
  Administered 2020-12-03: 75 mL via INTRAVENOUS

## 2020-12-04 ENCOUNTER — Encounter: Payer: Self-pay | Admitting: Obstetrics & Gynecology

## 2020-12-04 ENCOUNTER — Other Ambulatory Visit: Payer: Self-pay

## 2020-12-04 ENCOUNTER — Ambulatory Visit (INDEPENDENT_AMBULATORY_CARE_PROVIDER_SITE_OTHER): Payer: Medicare Other | Admitting: Obstetrics & Gynecology

## 2020-12-04 VITALS — BP 116/74 | HR 74 | Resp 14

## 2020-12-04 DIAGNOSIS — Z09 Encounter for follow-up examination after completed treatment for conditions other than malignant neoplasm: Secondary | ICD-10-CM | POA: Diagnosis not present

## 2020-12-04 DIAGNOSIS — N8501 Benign endometrial hyperplasia: Secondary | ICD-10-CM

## 2020-12-04 NOTE — Progress Notes (Signed)
    Brenda Cobb 10/19/1945 [id number]        75 y.o.  G0  RP: Postop HSC/Myosure Excisions/D+C  HPI: Doing very well, no abdominopelvic pain.  No vaginal bleeding.  No vaginal d/c.  No fever.   OB History  Gravida Para Term Preterm AB Living  0            SAB IAB Ectopic Multiple Live Births               Past medical history,surgical history, problem list, medications, allergies, family history and social history were all reviewed and documented in the EPIC chart.   Directed ROS with pertinent positives and negatives documented in the history of present illness/assessment and plan.  Exam:  Vitals:   12/04/20 1601  BP: 116/74  Pulse: 74  Resp: 14   General appearance:  Normal  Abdomen: Normal  Gynecologic exam: Vulva normal.  Bimanual exam:  Cervix NT to mobilization.  Uterus with large Fibroid.  Normal vaginal secretions, no bleeding.  FINAL MICROSCOPIC DIAGNOSIS:   A. 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  - Multiple fragments of endometrial polyp with inactive glands.  - No atypia or evidence of malignancy.                                                 Assessment/Plan:  75 y.o. G0P0   1. Status post gynecological surgery, follow-up exam Excellent Postop healing, no Cx.  2. Simple endometrial hyperplasia  F/U Pelvic US/Possible EBx 03/26/2021.  Continue on Provera 2.5 mg PO daily until then.  Princess Bruins MD, 4:11 PM 12/04/2020

## 2020-12-16 ENCOUNTER — Ambulatory Visit: Payer: Self-pay | Admitting: Surgery

## 2020-12-19 ENCOUNTER — Other Ambulatory Visit: Payer: Self-pay | Admitting: Surgery

## 2020-12-19 DIAGNOSIS — E210 Primary hyperparathyroidism: Secondary | ICD-10-CM

## 2020-12-22 ENCOUNTER — Other Ambulatory Visit: Payer: Self-pay | Admitting: Surgery

## 2020-12-22 DIAGNOSIS — E210 Primary hyperparathyroidism: Secondary | ICD-10-CM

## 2021-01-05 NOTE — Patient Instructions (Addendum)
DUE TO COVID-19 ONLY ONE VISITOR IS ALLOWED TO COME WITH YOU AND STAY IN THE WAITING ROOM ONLY DURING PRE OP AND PROCEDURE.   **NO VISITORS ARE ALLOWED IN THE SHORT STAY AREA OR RECOVERY ROOM!!**  IF YOU WILL BE ADMITTED INTO THE HOSPITAL YOU ARE ALLOWED ONLY TWO SUPPORT PEOPLE DURING VISITATION HOURS ONLY (10AM -8PM)   The support person(s) may change daily. The support person(s) must pass our screening, gel in and out, and wear a mask at all times, including in the patient's room. Patients must also wear a mask when staff or their support person are in the room.  No visitors under the age of 47. Any visitor under the age of 17 must be accompanied by an adult.    COVID SWAB TESTING MUST BE COMPLETED ON: 01/21/21  **MUST PRESENT COMPLETED FORM AT TESTING SITE**    [facility] (backside of the building) Open 8am-3pm. No appointment needed. You are not required to quarantine, however you are required to wear a well-fitted mask when you are out and around people not in your household.  Hand Hygiene often Do NOT share personal items Notify your provider if you are in close contact with someone who has COVID or you develop fever 100.4 or greater, new onset of sneezing, cough, sore throat, shortness of breath or body aches.       Your procedure is scheduled on: 01/23/21   Report to [facility] Main Entrance    Report to admitting at 10:30 AM   Call this number if you have problems the morning of surgery [phone]   Do not eat food :After Midnight.   May have liquids until 9:45 AM day of surgery  CLEAR LIQUID DIET  Foods Allowed                                                                     Foods Excluded  Water, Black Coffee and tea (no milk or creamer)            liquids that you cannot  Plain Jell-O in any flavor  (No red)                                     see through such as: Fruit ices (not with fruit pulp)                                              milk, soups, orange juice              Iced Popsicles (No red)                                                All solid food  Apple juices Sports drinks like Gatorade (No red) Lightly seasoned clear broth or consume(fat free) Sugar   Oral Hygiene is also important to reduce your risk of infection.                                    Remember - BRUSH YOUR TEETH THE MORNING OF SURGERY WITH YOUR REGULAR TOOTHPASTE    Take these medicines the morning of surgery with A SIP OF WATER: Synthroid                              You may not have any metal on your body including hair pins, jewelry, and body piercing             Do not wear make-up, lotions, powders, perfumes, or deodorant  Do not wear nail polish including gel and S&S, artificial/acrylic nails, or any other type of covering on natural nails including finger and toenails. If you have artificial nails, gel coating, etc. that needs to be removed by a nail salon please have this removed prior to surgery or surgery may need to be canceled/ delayed if the surgeon/ anesthesia feels like they are unable to be safely monitored.   Do not shave  48 hours prior to surgery.    Do not bring valuables to the hospital. [place].   Contacts, dentures or bridgework may not be worn into surgery.   Bring small overnight bag day of surgery.   Please contact your cardiologist for instructions regarding Aspirin before surgery.  Special Instructions: Bring a copy of your healthcare power of attorney and living will documents         the day of surgery if you haven't scanned them before.   Please read over the following fact sheets you were given: IF YOU HAVE QUESTIONS ABOUT YOUR PRE-OP INSTRUCTIONS PLEASE CALL [place] - Preparing for Surgery Before surgery, you can play an important role.  Because skin is not sterile, your skin needs  to be as free of germs as possible.  You can reduce the number of germs on your skin by washing with CHG (chlorahexidine gluconate) soap before surgery.  CHG is an antiseptic cleaner which kills germs and bonds with the skin to continue killing germs even after washing. Please DO NOT use if you have an allergy to CHG or antibacterial soaps.  If your skin becomes reddened/irritated stop using the CHG and inform your nurse when you arrive at Short Stay. Do not shave (including legs and underarms) for at least 48 hours prior to the first CHG shower.  You may shave your face/neck.  Please follow these instructions carefully:  1.  Shower with CHG Soap the night before surgery and the  morning of surgery.  2.  If you choose to wash your hair, wash your hair first as usual with your normal  shampoo.  3.  After you shampoo, rinse your hair and body thoroughly to remove the shampoo.                             4.  Use CHG as you would any other liquid soap.  You can apply chg directly  to the skin and wash.  Gently with a scrungie or clean washcloth.  5.  Apply the CHG Soap to your body ONLY FROM THE NECK DOWN.   Do   not use on face/ open                           Wound or open sores. Avoid contact with eyes, ears mouth and   genitals (private parts).                       Wash face,  Genitals (private parts) with your normal soap.             6.  Wash thoroughly, paying special attention to the area where your    surgery  will be performed.  7.  Thoroughly rinse your body with warm water from the neck down.  8.  DO NOT shower/wash with your normal soap after using and rinsing off the CHG Soap.                9.  Pat yourself dry with a clean towel.            10.  Wear clean pajamas.            11.  Place clean sheets on your bed the night of your first shower and do not  sleep with pets. Day of Surgery : Do not apply any lotions/deodorants the morning of surgery.  Please wear clean clothes to the  hospital/surgery center.  FAILURE TO FOLLOW THESE INSTRUCTIONS MAY RESULT IN THE CANCELLATION OF YOUR SURGERY  PATIENT SIGNATURE_________________________________  NURSE SIGNATURE__________________________________  ________________________________________________________________________

## 2021-01-05 NOTE — Progress Notes (Addendum)
COVID swab appointment: 01/21/21  COVID Vaccine Completed: yes x3 Date COVID Vaccine completed: 04/07/19, 05/05/19 Has received booster: 02/01/20 COVID vaccine manufacturer: Moderna     Date of COVID positive in last 90 days: no  PCP - Princess Bruins, MD Cardiologist - Quay Burow, MD  Cardiac clearance 11/12/20 by Quay Burow in chart  Chest x-ray - n/a EKG - 05/23/20 Epic Stress Test - n/a ECHO - 05/21/20 Epic Cardiac Cath - n/a Pacemaker/ICD device last checked: n/a Spinal Cord Stimulator: n/a  Sleep Study - n/a CPAP -   Fasting Blood Sugar - n/a Checks Blood Sugar _____ times a day  Blood Thinner Instructions:  Aspirin Instructions: ASA 81, no instructions given will contact cardiologist for instructions Last Dose:  Activity level: Can go up a flight of stairs and perform activities of daily living without stopping and without symptoms of chest pain or shortness of breath.     Anesthesia review:   Patient denies shortness of breath, fever, cough and chest pain at PAT appointment   Patient verbalized understanding of instructions that were given to them at the PAT appointment. Patient was also instructed that they will need to review over the PAT instructions again at home before surgery.

## 2021-01-06 ENCOUNTER — Encounter (HOSPITAL_COMMUNITY): Payer: Self-pay

## 2021-01-06 ENCOUNTER — Encounter (HOSPITAL_COMMUNITY)
Admission: RE | Admit: 2021-01-06 | Discharge: 2021-01-06 | Disposition: A | Discharge status: Home / Self Care | Payer: Medicare Other | Source: Ambulatory Visit | Attending: Surgery | Admitting: Surgery

## 2021-01-06 VITALS — BP 134/74 | HR 73 | Temp 98.3°F | Resp 16 | Ht 63.0 in | Wt 98.6 lb

## 2021-01-06 DIAGNOSIS — Z01812 Encounter for preprocedural laboratory examination: Secondary | ICD-10-CM | POA: Insufficient documentation

## 2021-01-06 DIAGNOSIS — I341 Nonrheumatic mitral (valve) prolapse: Secondary | ICD-10-CM | POA: Diagnosis not present

## 2021-01-06 LAB — CBC
HCT: 39.1 % (ref 36.0–46.0)
Hemoglobin: 12.9 g/dL (ref 12.0–15.0)
MCH: 30.8 pg (ref 26.0–34.0)
MCHC: 33 g/dL (ref 30.0–36.0)
MCV: 93.3 fL (ref 80.0–100.0)
Platelets: 249 10*3/uL (ref 150–400)
RBC: 4.19 MIL/uL (ref 3.87–5.11)
RDW: 12.7 % (ref 11.5–15.5)
WBC: 3.5 10*3/uL — ABNORMAL LOW (ref 4.0–10.5)
nRBC: 0 % (ref 0.0–0.2)

## 2021-01-06 LAB — BASIC METABOLIC PANEL
Anion gap: 5 (ref 5–15)
BUN: 14 mg/dL (ref 8–23)
CO2: 26 mmol/L (ref 22–32)
Calcium: 10.3 mg/dL (ref 8.9–10.3)
Chloride: 108 mmol/L (ref 98–111)
Creatinine, Ser: 0.83 mg/dL (ref 0.44–1.00)
GFR, Estimated: >60 mL/min (ref >60)
Glucose, Bld: 96 mg/dL (ref 70–99)
Potassium: 4.8 mmol/L (ref 3.5–5.1)
Sodium: 139 mmol/L (ref 135–145)

## 2021-01-21 ENCOUNTER — Other Ambulatory Visit: Payer: Self-pay | Admitting: Surgery

## 2021-01-21 ENCOUNTER — Encounter (HOSPITAL_COMMUNITY): Payer: Self-pay | Admitting: Surgery

## 2021-01-21 DIAGNOSIS — E210 Primary hyperparathyroidism: Secondary | ICD-10-CM | POA: Diagnosis present

## 2021-01-21 LAB — SARS CORONAVIRUS 2 (TAT 6-24 HRS): SARS Coronavirus 2: NEGATIVE

## 2021-01-21 NOTE — H&P (Signed)
REFERRING PHYSICIAN: Birdena Crandall, MD  PROVIDER: Obie Silos Charlotta Newton, MD  Chief Complaint: New Consultation (Primary hyperparathyroidism)   History of Present Illness:  Patient is a 75 year old female from [place], [place], referred by Dr. Jacelyn Pi for surgical evaluation and management of suspected primary hyperparathyroidism. Patient has a history of Graves' disease treated with radioactive iodine ablation in 2007. She also has a history of osteoporosis. Recent laboratory studies showed an elevated serum calcium level of 11.4 and an elevated intact PTH level of 117. Patient underwent nuclear medicine parathyroid scan on October 29, 2020. This was negative for any evidence of parathyroid adenoma. Patient has had no other imaging studies. She denies fatigue. She has no recent fractures. She denies nephrolithiasis. There is no family history of parathyroid disease. Patient has had no prior head or neck surgery. She presents today accompanied by her sister.  Review of Systems: A complete review of systems was obtained from the patient. I have reviewed this information and discussed as appropriate with the patient. See HPI as well for other ROS.  Review of Systems  Constitutional: Negative.  HENT: Negative.  Eyes: Negative.  Respiratory: Negative.  Cardiovascular: Negative.  Gastrointestinal: Negative.  Genitourinary: Negative.  Musculoskeletal: Negative.  Skin: Negative.  Neurological: Negative.  Endo/Heme/Allergies: Negative.  Psychiatric/Behavioral: Negative.    Medical History: Past Medical History:  Diagnosis Date   Anemia   Aneurysm (CMS-HCC)   Anxiety   Arrhythmia   Heart valve disease   Thyroid disease   Patient Active Problem List  Diagnosis   Primary hyperparathyroidism (CMS-HCC)   History reviewed. No pertinent surgical history.   No Known Allergies  Current Outpatient Medications on File Prior to Visit  Medication Sig Dispense Refill    diphenhydrAMINE (ZZZQUIL) 25 mg capsule Take by mouth   fluticasone propionate (FLONASE) 50 mcg/actuation nasal spray Place into one nostril   irbesartan (AVAPRO) 150 MG tablet   SYNTHROID 88 mcg tablet   No current facility-administered medications on file prior to visit.   History reviewed. No pertinent family history.   Social History   Tobacco Use  Smoking Status Never Smoker  Smokeless Tobacco Not on file    Social History   Socioeconomic History   Marital status: Single  Tobacco Use   Smoking status: Never Smoker  Vaping Use   Vaping Use: Never used  Substance and Sexual Activity   Alcohol use: Yes   Drug use: Never   Objective:   Vitals:  BP: (!) 148/72  Pulse: 80  Temp: 36.8 C (98.3 F)  SpO2: 100%  Weight: 45.5 kg (100 lb 3.2 oz)  Height: 160 cm (5\' 3" )   Body mass index is 17.75 kg/m.  Physical Exam   GENERAL APPEARANCE Development: normal Nutritional status: normal Gross deformities: none  SKIN Rash, lesions, ulcers: none Induration, erythema: none Nodules: none palpable  EYES Conjunctiva and lids: normal Pupils: equal and reactive Iris: normal bilaterally  EARS, NOSE, MOUTH, THROAT External ears: no lesion or deformity External nose: no lesion or deformity Hearing: grossly normal Due to Covid-19 pandemic, patient is wearing a mask.  NECK Symmetric: yes Trachea: midline Thyroid: no palpable nodules in the thyroid bed  CHEST Respiratory effort: normal Retraction or accessory muscle use: no Breath sounds: normal bilaterally Rales, rhonchi, wheeze: none  CARDIOVASCULAR Auscultation: regular rhythm, normal rate Murmurs: grade 2 diastolic murmur Pulses: radial pulse 2+ palpable Lower extremity edema: none  MUSCULOSKELETAL Station and gait: normal Digits and nails: no clubbing or cyanosis  Muscle strength: grossly normal all extremities Range of motion: grossly normal all extremities Deformity: none  LYMPHATIC Cervical:  none palpable Supraclavicular: none palpable  PSYCHIATRIC Oriented to person, place, and time: yes Mood and affect: normal for situation Judgment and insight: appropriate for situation  Assessment and Plan:   Primary hyperparathyroidism (CMS-HCC)   Patient is referred by Dr. Jacelyn Pi for surgical evaluation and recommendations regarding suspected primary hyperparathyroidism.  Patient provided with a copy of "Parathyroid Surgery: Treatment for Your Parathyroid Gland Problem", published by [employer], 12 pages. Book reviewed and explained to patient during visit today.  Patient has biochemical evidence of primary hyperparathyroidism. She has not had a 24-hour urine collection for calcium. We will ask her to complete this study in [place]. We will order it through [facility]. We will arrange for additional imaging studies to be performed here in [place]. This will include an ultrasound examination as well as a 4D CT scan. Patient is signed up for MyChart and we will communicate her results through this system.  We discussed minimally invasive parathyroidectomy. If we can confirm her diagnosis and the location of the adenoma, then I believe she will be a good candidate for outpatient minimally invasive surgery. We discussed the procedure, the surgical incision, and the risk of the procedure including the risk of recurrent laryngeal nerve injury. Patient was provided with written literature on parathyroid surgery.  Patient will undergo the above studies. We will contact her with her results when they are available.  Armandina Gemma, MD [facility] [place] practice Office: [phone]

## 2021-01-22 ENCOUNTER — Encounter (HOSPITAL_COMMUNITY): Payer: Self-pay | Admitting: Surgery

## 2021-01-23 ENCOUNTER — Encounter (HOSPITAL_COMMUNITY)
Admission: RE | Disposition: A | Discharge status: Home / Self Care | Payer: Self-pay | Source: Ambulatory Visit | Attending: Surgery

## 2021-01-23 ENCOUNTER — Encounter (HOSPITAL_COMMUNITY): Payer: Self-pay | Admitting: Surgery

## 2021-01-23 ENCOUNTER — Ambulatory Visit (HOSPITAL_COMMUNITY)
Admission: RE | Admit: 2021-01-23 | Discharge: 2021-01-24 | Disposition: A | Discharge status: Home / Self Care | Payer: Medicare Other | Source: Ambulatory Visit | Attending: Surgery | Admitting: Surgery

## 2021-01-23 ENCOUNTER — Other Ambulatory Visit: Payer: Self-pay

## 2021-01-23 ENCOUNTER — Ambulatory Visit (HOSPITAL_COMMUNITY): Payer: Medicare Other | Admitting: Anesthesiology

## 2021-01-23 DIAGNOSIS — D351 Benign neoplasm of parathyroid gland: Secondary | ICD-10-CM | POA: Diagnosis not present

## 2021-01-23 DIAGNOSIS — E210 Primary hyperparathyroidism: Secondary | ICD-10-CM | POA: Diagnosis present

## 2021-01-23 DIAGNOSIS — I5030 Unspecified diastolic (congestive) heart failure: Secondary | ICD-10-CM | POA: Diagnosis not present

## 2021-01-23 DIAGNOSIS — I082 Rheumatic disorders of both aortic and tricuspid valves: Secondary | ICD-10-CM | POA: Insufficient documentation

## 2021-01-23 DIAGNOSIS — E0500 Thyrotoxicosis with diffuse goiter without thyrotoxic crisis or storm: Secondary | ICD-10-CM | POA: Diagnosis not present

## 2021-01-23 DIAGNOSIS — M810 Age-related osteoporosis without current pathological fracture: Secondary | ICD-10-CM | POA: Diagnosis not present

## 2021-01-23 DIAGNOSIS — I110 Hypertensive heart disease with heart failure: Secondary | ICD-10-CM | POA: Insufficient documentation

## 2021-01-23 DIAGNOSIS — E039 Hypothyroidism, unspecified: Secondary | ICD-10-CM | POA: Insufficient documentation

## 2021-01-23 DIAGNOSIS — Z79899 Other long term (current) drug therapy: Secondary | ICD-10-CM | POA: Insufficient documentation

## 2021-01-23 HISTORY — PX: PARATHYROIDECTOMY: SHX19

## 2021-01-23 SURGERY — PARATHYROIDECTOMY
Anesthesia: General

## 2021-01-23 MED ORDER — DEXAMETHASONE SODIUM PHOSPHATE 10 MG/ML IJ SOLN
INTRAMUSCULAR | Status: DC | PRN
Start: 2021-01-23 — End: 2021-01-23
  Administered 2021-01-23: 4 mg via INTRAVENOUS

## 2021-01-23 MED ORDER — LEVOTHYROXINE SODIUM 88 MCG PO TABS
88.0000 ug | ORAL_TABLET | Freq: Every day | ORAL | Status: DC
  Administered 2021-01-24: 88 ug via ORAL
  Filled 2021-01-23: qty 1

## 2021-01-23 MED ORDER — EPHEDRINE SULFATE-NACL 50-0.9 MG/10ML-% IV SOSY
PREFILLED_SYRINGE | INTRAVENOUS | Status: DC | PRN
  Administered 2021-01-23 (×5): 5 mg via INTRAVENOUS

## 2021-01-23 MED ORDER — HEMOSTATIC AGENTS (NO CHARGE) OPTIME
TOPICAL | Status: DC | PRN
  Administered 2021-01-23: 1 via TOPICAL

## 2021-01-23 MED ORDER — FENTANYL CITRATE PF 50 MCG/ML IJ SOSY: 25.0000 ug | PREFILLED_SYRINGE | INTRAMUSCULAR | Status: DC | PRN

## 2021-01-23 MED ORDER — FENTANYL CITRATE (PF) 100 MCG/2ML IJ SOLN
INTRAMUSCULAR | Status: AC
  Filled 2021-01-23: qty 2

## 2021-01-23 MED ORDER — IRBESARTAN 150 MG PO TABS
150.0000 mg | ORAL_TABLET | Freq: Every day | ORAL | Status: DC
  Administered 2021-01-23 – 2021-01-24 (×2): 150 mg via ORAL
  Filled 2021-01-23 (×2): qty 1

## 2021-01-23 MED ORDER — ONDANSETRON HCL 4 MG/2ML IJ SOLN
INTRAMUSCULAR | Status: AC
  Filled 2021-01-23: qty 2

## 2021-01-23 MED ORDER — CEFAZOLIN SODIUM-DEXTROSE 2-4 GM/100ML-% IV SOLN
2.0000 g | INTRAVENOUS | Status: AC
  Administered 2021-01-23: 2 g via INTRAVENOUS
  Filled 2021-01-23: qty 100

## 2021-01-23 MED ORDER — PROPOFOL 1000 MG/100ML IV EMUL
INTRAVENOUS | Status: AC
  Filled 2021-01-23: qty 100

## 2021-01-23 MED ORDER — TRAMADOL HCL 50 MG PO TABS: 50.0000 mg | ORAL_TABLET | Freq: Four times a day (QID) | ORAL | Status: DC | PRN

## 2021-01-23 MED ORDER — ROCURONIUM BROMIDE 10 MG/ML (PF) SYRINGE
PREFILLED_SYRINGE | INTRAVENOUS | Status: DC | PRN
  Administered 2021-01-23: 10 mg via INTRAVENOUS
  Administered 2021-01-23: 40 mg via INTRAVENOUS
  Administered 2021-01-23: 10 mg via INTRAVENOUS

## 2021-01-23 MED ORDER — BUPIVACAINE HCL 0.25 % IJ SOLN
INTRAMUSCULAR | Status: DC | PRN
  Administered 2021-01-23: 10 mL

## 2021-01-23 MED ORDER — CHLORHEXIDINE GLUCONATE CLOTH 2 % EX PADS: 6.0000 | MEDICATED_PAD | Freq: Once | CUTANEOUS | Status: DC

## 2021-01-23 MED ORDER — BUPIVACAINE HCL 0.25 % IJ SOLN
INTRAMUSCULAR | Status: AC
  Filled 2021-01-23: qty 1

## 2021-01-23 MED ORDER — ACETAMINOPHEN 325 MG PO TABS
650.0000 mg | ORAL_TABLET | Freq: Four times a day (QID) | ORAL | Status: DC | PRN
  Administered 2021-01-23: 650 mg via ORAL
  Filled 2021-01-23: qty 2

## 2021-01-23 MED ORDER — ONDANSETRON HCL 4 MG/2ML IJ SOLN: 4.0000 mg | Freq: Four times a day (QID) | INTRAMUSCULAR | Status: DC | PRN

## 2021-01-23 MED ORDER — ESMOLOL HCL 100 MG/10ML IV SOLN
INTRAVENOUS | Status: DC | PRN
  Administered 2021-01-23: 5 mg via INTRAVENOUS

## 2021-01-23 MED ORDER — ONDANSETRON 4 MG PO TBDP: 4.0000 mg | ORAL_TABLET | Freq: Four times a day (QID) | ORAL | Status: DC | PRN

## 2021-01-23 MED ORDER — OXYCODONE HCL 5 MG PO TABS: 5.0000 mg | ORAL_TABLET | Freq: Once | ORAL | Status: DC | PRN

## 2021-01-23 MED ORDER — LIDOCAINE HCL (PF) 2 % IJ SOLN
INTRAMUSCULAR | Status: AC
  Filled 2021-01-23: qty 5

## 2021-01-23 MED ORDER — FENTANYL CITRATE (PF) 100 MCG/2ML IJ SOLN
INTRAMUSCULAR | Status: DC | PRN
  Administered 2021-01-23 (×2): 50 ug via INTRAVENOUS

## 2021-01-23 MED ORDER — CHLORHEXIDINE GLUCONATE 0.12 % MT SOLN
15.0000 mL | Freq: Once | OROMUCOSAL | Status: AC
  Administered 2021-01-23: 15 mL via OROMUCOSAL

## 2021-01-23 MED ORDER — OXYCODONE HCL 5 MG/5ML PO SOLN: 5.0000 mg | Freq: Once | ORAL | Status: DC | PRN

## 2021-01-23 MED ORDER — ONDANSETRON HCL 4 MG/2ML IJ SOLN: 4.0000 mg | Freq: Once | INTRAMUSCULAR | Status: DC | PRN

## 2021-01-23 MED ORDER — ONDANSETRON HCL 4 MG/2ML IJ SOLN
INTRAMUSCULAR | Status: DC | PRN
  Administered 2021-01-23: 4 mg via INTRAVENOUS

## 2021-01-23 MED ORDER — HYDROMORPHONE HCL 1 MG/ML IJ SOLN: 1.0000 mg | INTRAMUSCULAR | Status: DC | PRN

## 2021-01-23 MED ORDER — TRAMADOL HCL 50 MG PO TABS: 50.0000 mg | ORAL_TABLET | Freq: Four times a day (QID) | ORAL | 0 refills | Status: DC | PRN

## 2021-01-23 MED ORDER — 0.9 % SODIUM CHLORIDE (POUR BTL) OPTIME
TOPICAL | Status: DC | PRN
  Administered 2021-01-23: 1000 mL

## 2021-01-23 MED ORDER — OXYCODONE HCL 5 MG PO TABS: 5.0000 mg | ORAL_TABLET | ORAL | Status: DC | PRN

## 2021-01-23 MED ORDER — PROPOFOL 10 MG/ML IV BOLUS
INTRAVENOUS | Status: DC | PRN
  Administered 2021-01-23: 80 mg via INTRAVENOUS

## 2021-01-23 MED ORDER — SODIUM CHLORIDE 0.45 % IV SOLN: INTRAVENOUS | Status: DC

## 2021-01-23 MED ORDER — ORAL CARE MOUTH RINSE: 15.0000 mL | Freq: Once | OROMUCOSAL | Status: AC

## 2021-01-23 MED ORDER — LACTATED RINGERS IV SOLN: INTRAVENOUS | Status: DC

## 2021-01-23 MED ORDER — SUGAMMADEX SODIUM 200 MG/2ML IV SOLN
INTRAVENOUS | Status: DC | PRN
  Administered 2021-01-23: 150 mg via INTRAVENOUS

## 2021-01-23 MED ORDER — MEDROXYPROGESTERONE ACETATE 2.5 MG PO TABS
2.5000 mg | ORAL_TABLET | Freq: Every day | ORAL | Status: DC
  Administered 2021-01-23 – 2021-01-24 (×2): 2.5 mg via ORAL
  Filled 2021-01-23 (×2): qty 1

## 2021-01-23 MED ORDER — ACETAMINOPHEN 650 MG RE SUPP: 650.0000 mg | Freq: Four times a day (QID) | RECTAL | Status: DC | PRN

## 2021-01-23 MED ORDER — DEXAMETHASONE SODIUM PHOSPHATE 10 MG/ML IJ SOLN
INTRAMUSCULAR | Status: AC
  Filled 2021-01-23: qty 1

## 2021-01-23 MED ORDER — LIDOCAINE 2% (20 MG/ML) 5 ML SYRINGE
INTRAMUSCULAR | Status: DC | PRN
  Administered 2021-01-23: 40 mg via INTRAVENOUS

## 2021-01-23 MED ORDER — ROCURONIUM BROMIDE 10 MG/ML (PF) SYRINGE
PREFILLED_SYRINGE | INTRAVENOUS | Status: AC
  Filled 2021-01-23: qty 10

## 2021-01-23 MED ORDER — FENTANYL CITRATE PF 50 MCG/ML IJ SOSY
PREFILLED_SYRINGE | INTRAMUSCULAR | Status: AC
  Administered 2021-01-23: 50 ug via INTRAVENOUS
  Filled 2021-01-23: qty 3

## 2021-01-23 SURGICAL SUPPLY — 31 items
ATTRACTOMAT 16X20 MAGNETIC DRP (DRAPES) ×2 IMPLANT
BAG COUNTER SPONGE SURGICOUNT (BAG) ×2 IMPLANT
BLADE SURG 15 STRL LF DISP TIS (BLADE) ×1 IMPLANT
BLADE SURG 15 STRL SS (BLADE) ×2
CHLORAPREP W/TINT 26 (MISCELLANEOUS) ×2 IMPLANT
CLIP TI MEDIUM 6 (CLIP) ×2 IMPLANT
CLIP TI WIDE RED SMALL 6 (CLIP) ×2 IMPLANT
COVER SURGICAL LIGHT HANDLE (MISCELLANEOUS) ×2 IMPLANT
DERMABOND ADVANCED (GAUZE/BANDAGES/DRESSINGS) ×1
DERMABOND ADVANCED .7 DNX12 (GAUZE/BANDAGES/DRESSINGS) ×1 IMPLANT
DRAPE LAPAROTOMY T 98X78 PEDS (DRAPES) ×2 IMPLANT
DRAPE UTILITY XL STRL (DRAPES) ×2 IMPLANT
ELECT REM PT RETURN 15FT ADLT (MISCELLANEOUS) ×2 IMPLANT
GAUZE 4X4 16PLY ~~LOC~~+RFID DBL (SPONGE) ×2 IMPLANT
GLOVE SURG SYN 7.5  E (GLOVE) ×2
GLOVE SURG SYN 7.5 E (GLOVE) ×2 IMPLANT
GOWN STRL REUS W/TWL XL LVL3 (GOWN DISPOSABLE) ×6 IMPLANT
HEMOSTAT SURGICEL 2X4 FIBR (HEMOSTASIS) ×2 IMPLANT
ILLUMINATOR WAVEGUIDE N/F (MISCELLANEOUS) IMPLANT
KIT BASIN OR (CUSTOM PROCEDURE TRAY) ×2 IMPLANT
KIT TURNOVER KIT A (KITS) IMPLANT
NEEDLE HYPO 25X1 1.5 SAFETY (NEEDLE) ×2 IMPLANT
PACK BASIC VI WITH GOWN DISP (CUSTOM PROCEDURE TRAY) ×2 IMPLANT
PENCIL SMOKE EVACUATOR (MISCELLANEOUS) ×2 IMPLANT
SUT MNCRL AB 4-0 PS2 18 (SUTURE) ×2 IMPLANT
SUT VIC AB 3-0 SH 18 (SUTURE) ×2 IMPLANT
SYR BULB IRRIG 60ML STRL (SYRINGE) ×2 IMPLANT
SYR CONTROL 10ML LL (SYRINGE) ×2 IMPLANT
TOWEL OR 17X26 10 PK STRL BLUE (TOWEL DISPOSABLE) ×2 IMPLANT
TOWEL OR NON WOVEN STRL DISP B (DISPOSABLE) ×2 IMPLANT
TUBING CONNECTING 10 (TUBING) ×2 IMPLANT

## 2021-01-23 NOTE — Op Note (Signed)
Operative Note  Pre-operative Diagnosis:  primary hyperparathyroidism   Post-operative Diagnosis:  same  Surgeon:  Armandina Gemma, MD  Assistant:  none   Procedure:  neck exploration with left inferior parathyroidectomy  Anesthesia:  general  Estimated Blood Loss:  minimal  Drains: none         Specimen: parathyroid gland to pathology  Indications:  Patient is a 75 year old female from [place], [place], referred by Dr. Jacelyn Pi for surgical evaluation and management of suspected primary hyperparathyroidism. Patient has a history of Graves' disease treated with radioactive iodine ablation in 2007. She also has a history of osteoporosis. Recent laboratory studies showed an elevated serum calcium level of 11.4 and an elevated intact PTH level of 117. Patient underwent nuclear medicine parathyroid scan on October 29, 2020. This was negative for any evidence of parathyroid adenoma.  Patient subsequently underwent an ultrasound examination of the neck as well as a 4D CT scan of the neck.  None of the studies were able to localize evidence of a parathyroid adenoma.  Therefore the patient comes to the operating room for neck exploration and parathyroidectomy.  Procedure:  The patient was seen in the pre-op holding area. The risks, benefits, complications, treatment options, and expected outcomes were previously discussed with the patient. The patient agreed with the proposed plan and has signed the informed consent form.  The patient was brought to the operating room by the surgical team, identified as Brenda Cobb and the procedure verified. A "time out" was completed and the above information confirmed.  Following administration of general anesthesia, the patient is positioned and then prepped and draped in the usual aseptic fashion.  After ascertaining that an adequate level of anesthesia been achieved, a Kocher incision is made with a #15 blade.  Dissection is carried through the  subcutaneous tissues and platysma.  Hemostasis is achieved with the electrocautery.  Skin flaps are elevated cephalad and caudad.  A self-retaining retractors placed for exposure.  Strap muscles are incised in the midline.  Strap muscles are reflected to the left exposing a small atrophic thyroid.  Dissection reveals a prominent parathyroid located posteriorly just beneath the level of the inferior thyroid artery.  This measures approximately 6 to 7 mm in size and is rounded and relatively firm.  Further exploration reveals what appears to be normal parathyroid tissue superior to the inferior thyroid artery.  Next we explored the right side of the neck.  Again strap muscles were reflected laterally.  Exploration reveals what appears to be a normal right inferior parathyroid gland on the surface of the trachea just beneath the atrophic thyroid tissue.  Posteriorly in the tracheoesophageal groove just above the level of the inferior thyroid artery is what appears to be normal superior parathyroid gland.  Turning our attention back to the left side we proceeded with resection of the enlarged left inferior parathyroid gland.  This gland was markedly different in gross appearance from the other 3.  It was mobilized and its vascular pedicle divided between small ligaclips with the Metzenbaum scissors.  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  Neck is irrigated with warm saline.  Fibrillar is placed throughout the operative field.  Strap muscles are reapproximated in the midline of interrupted 3-0 Vicryl sutures.  Platysma was closed with interrupted 3-0 Vicryl sutures.  Skin edges are anesthetized with local anesthetic.  Skin edges are reapproximated with a running 4-0 Monocryl subcuticular suture.  Wound  is washed and dried and Dermabond is applied as dressing.  Patient is awakened from anesthesia and transported to the  recovery room.  The patient tolerated the procedure well.   Armandina Gemma, [facility] Office: [phone]

## 2021-01-23 NOTE — Anesthesia Postprocedure Evaluation (Signed)
Anesthesia Post Note  Patient: Brenda Cobb  Procedure(s) Performed: NECK EXPLORATION WITH PARATHYROIDECTOMY     Patient location during evaluation: PACU Anesthesia Type: General Level of consciousness: awake and alert Pain management: pain level controlled Vital Signs Assessment: post-procedure vital signs reviewed and stable Respiratory status: spontaneous breathing, nonlabored ventilation and respiratory function stable Cardiovascular status: stable and blood pressure returned to baseline Anesthetic complications: no   No notable events documented.  Last Vitals:  Vitals:   01/23/21 1515 01/23/21 1533  BP:  (!) 154/78  Pulse: 70 68  Resp: 15 16  Temp:  36.6 C  SpO2: 100% 98%    Last Pain:  Vitals:   01/23/21 1533  TempSrc: Oral  PainSc:                  [name]

## 2021-01-23 NOTE — Anesthesia Procedure Notes (Signed)
Procedure Name: Intubation Date/Time: 01/23/2021 12:54 PM Performed by: Lavina Hamman, CRNA Pre-anesthesia Checklist: Patient identified, Emergency Drugs available, Suction available, Patient being monitored and Timeout performed Patient Re-evaluated:Patient Re-evaluated prior to induction Oxygen Delivery Method: Circle system utilized Preoxygenation: Pre-oxygenation with 100% oxygen Induction Type: IV induction Ventilation: Mask ventilation without difficulty Laryngoscope Size: Mac and 3 Grade View: Grade I Tube type: Oral Tube size: 7.0 mm Number of attempts: 1 Airway Equipment and Method: Stylet Placement Confirmation: ETT inserted through vocal cords under direct vision, positive ETCO2, CO2 detector and breath sounds checked- equal and bilateral Secured at: 21 cm Tube secured with: Tape Dental Injury: Teeth and Oropharynx as per pre-operative assessment  Comments: ATOI

## 2021-01-23 NOTE — Transfer of Care (Signed)
Immediate Anesthesia Transfer of Care Note  Patient: Brenda Cobb  Procedure(s) Performed: Procedure(s): NECK EXPLORATION WITH PARATHYROIDECTOMY (N/A)  Patient Location: PACU  Anesthesia Type:General  Level of Consciousness:  sedated, patient cooperative and responds to stimulation  Airway & Oxygen Therapy:Patient Spontanous Breathing and Patient connected to face mask oxgen  Post-op Assessment:  Report given to PACU RN and Post -op Vital signs reviewed and stable  Post vital signs:  Reviewed and stable  Last Vitals:  Vitals:   01/23/21 1000  BP: (!) 181/81  Pulse: 87  Resp: 16  Temp: 37.1 C  SpO2: 832%    Complications: No apparent anesthesia complications

## 2021-01-23 NOTE — Discharge Instructions (Signed)
[facility] - Dr. Jaz Mallick  THYROID & PARATHYROID SURGERY:  POST-OP INSTRUCTIONS  Always review the instruction sheet provided by the hospital nurse at discharge.  A prescription for pain medication may be sent to your pharmacy at the time of discharge.  Take your pain medication as prescribed.  If narcotic pain medicine is not needed, then you may take acetaminophen (Tylenol) or ibuprofen (Advil) as needed for pain or soreness.  Take your normal home medications as prescribed unless otherwise directed.  If you need a refill on your pain medication, please contact the office during regular business hours.  Prescriptions will not be processed by the office after 5:00PM or on weekends.  Start with a light diet upon arrival home, such as soup and crackers or toast.  Be sure to drink plenty of fluids.  Resume your normal diet the day after surgery.  Most patients will experience some swelling and bruising on the chest and neck area.  Ice packs will help for the first 48 hours after arriving home.  Swelling and bruising will take several days to resolve.   It is common to experience some constipation after surgery.  Increasing fluid intake and taking a stool softener (Colace) will usually help to prevent this problem.  A mild laxative (Milk of Magnesia or Miralax) should be taken according to package directions if there has been no bowel movement after 48 hours.  Dermabond glue covers your incision. This seals the wound and you may shower at any time. The Dermabond will remain in place for about a week.  You may gradually remove the glue when it loosens around the edges.  If you need to loosen the Dermabond for removal, apply a layer of Vaseline to the wound for 15 minutes and then remove with a Kleenex. Your sutures are under the skin and will not show - they will dissolve on their own.  You may resume light daily activities beginning the day after discharge (such as self-care,  walking, climbing stairs), gradually increasing activities as tolerated. You may have sexual intercourse when it is comfortable. Refrain from any heavy lifting or straining until approved by your doctor. You may drive when you no longer are taking prescription pain medication, you can comfortably wear a seatbelt, and you can safely maneuver your car and apply the brakes.  You will see your doctor in the office for a follow-up appointment approximately three weeks after your surgery.  Make sure that you call for this appointment within a day or two after you arrive home to insure a convenient appointment time. Please have any requested laboratory tests performed a few days prior to your office visit so that the results will be available at your follow up appointment.  WHEN TO CALL THE [facility] OFFICE: -- Fever greater than 101.5 -- Inability to urinate -- Nausea and/or vomiting - persistent -- Extreme swelling or bruising -- Continued bleeding from incision -- Increased pain, redness, or drainage from the incision -- Difficulty swallowing or breathing -- Muscle cramping or spasms -- Numbness or tingling in hands or around lips  The clinic staff is available to answer your questions during regular business hours.  Please don't hesitate to call and ask to speak to one of the nurses if you have concerns.  [facility] OFFICE: [phone] (24 hours)  Please sign up for MyChart accounts. This will allow you to communicate directly with my nurse or myself without having to call the office. It will also allow you   to view your test results. You will need to enroll in MyChart for my office ([facility]) and for the hospital ([facility]).  Rever Pichette, MD [facility] practice 

## 2021-01-23 NOTE — Interval H&P Note (Signed)
History and Physical Interval Note:  01/23/2021 12:24 PM  Brenda Cobb  has presented today for surgery, with the diagnosis of PRIMARY HYPERPARATHYROIDISM.  The various methods of treatment have been discussed with the patient and family. After consideration of risks, benefits and other options for treatment, the patient has consented to    Procedure(s): NECK EXPLORATION WITH PARATHYROIDECTOMY (N/A) as a surgical intervention.    The patient's history has been reviewed, patient examined, no change in status, stable for surgery.  I have reviewed the patient's chart and labs.  Questions were answered to the patient's satisfaction.    Armandina Gemma, [facility] [place] practice Office: [place]

## 2021-01-23 NOTE — Anesthesia Preprocedure Evaluation (Addendum)
Anesthesia Evaluation  Patient identified by MRN, date of birth, ID band Patient awake    Reviewed: Allergy & Precautions, NPO status , Patient's Chart, lab work & pertinent test results  History of Anesthesia Complications Negative for: history of anesthetic complications  Airway Mallampati: I  TM Distance: >3 FB Neck ROM: Full    Dental  (+) Dental Advisory Given   Pulmonary neg pulmonary ROS,    Pulmonary exam normal        Cardiovascular hypertension, Pt. 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 Side by side comparison performed to echocardiograms from 2020, 2021, and 2022. Lesion in question is adjacent to the right lower pulmonary vein on echo, however on multiple views with thin slice assessment, perfusion, and post contrast images, no comparable lesion is seen on MRI. Suspect this finding on echo may be secondary to artifact or tissue plane.  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 The jet is broad based, eccentric and posteriorly directed and therefore unable to quantify based on PISA. 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 EF 60 to 65%. There is mild asymmetric left ventricular hypertrophy of the basal-septal segment. Grade II diastolic dysfunction (pseudonormalization). Left atrial size was severely dilated. Tricuspid valve regurgitation is mild to moderate. Aortic valve regurgitation is mild. Mild aortic valve sclerosis is present, with no evidence of AS   Neuro/Psych negative neurological ROS  negative psych ROS   GI/Hepatic negative GI ROS, Neg liver ROS,   Endo/Other   Hypothyroidism  Hyperparathyroidism   Renal/GU negative Renal ROS     Musculoskeletal negative musculoskeletal ROS (+)   Abdominal   Peds  Hematology negative hematology ROS (+)   Anesthesia Other Findings   Reproductive/Obstetrics                            Anesthesia Physical Anesthesia Plan  ASA: 3  Anesthesia Plan: General   Post-op Pain Management:    Induction: Intravenous  PONV Risk Score and Plan: 3 and Treatment may vary due to age or medical condition, Ondansetron and Dexamethasone  Airway Management Planned: Oral ETT  Additional Equipment: None  Intra-op Plan:   Post-operative Plan: Extubation in OR  Informed Consent: I have reviewed the patients History and Physical, chart, labs and discussed the procedure including the risks, benefits and alternatives for the proposed anesthesia with the patient or authorized representative who has indicated his/her understanding and acceptance.     Dental advisory given  Plan Discussed with: CRNA and Anesthesiologist  Anesthesia Plan Comments:        Anesthesia Quick Evaluation

## 2021-01-24 ENCOUNTER — Encounter (HOSPITAL_COMMUNITY): Payer: Self-pay | Admitting: Surgery

## 2021-01-24 DIAGNOSIS — E210 Primary hyperparathyroidism: Secondary | ICD-10-CM | POA: Diagnosis not present

## 2021-01-24 LAB — BASIC METABOLIC PANEL
Anion gap: 5 (ref 5–15)
BUN: 18 mg/dL (ref 8–23)
CO2: 26 mmol/L (ref 22–32)
Calcium: 9.2 mg/dL (ref 8.9–10.3)
Chloride: 104 mmol/L (ref 98–111)
Creatinine, Ser: 0.91 mg/dL (ref 0.44–1.00)
GFR, Estimated: >60 mL/min (ref >60)
Glucose, Bld: 99 mg/dL (ref 70–99)
Potassium: 4.6 mmol/L (ref 3.5–5.1)
Sodium: 135 mmol/L (ref 135–145)

## 2021-01-24 NOTE — Discharge Summary (Signed)
Physician Discharge Summary  Patient ID: ADALEAH FORGET MRN: [id number] DOB/AGE: October 21, 1945 75 y.o.  Admit date: 01/23/2021 Discharge date: 01/24/2021  Admission Diagnoses:  Discharge Diagnoses:  Principal Problem:   Hyperparathyroidism, primary ([place]) Active Problems:   Primary hyperparathyroidism [facility])   Discharged Condition: good  Hospital Course: uneventful post op recovery.  Discharged doing well POD#1.  Consults:     Significant Diagnostic Studies:   Treatments: surgery: neck exploration with parathyroidectomy  Discharge Exam: Blood pressure 116/66, pulse 67, temperature 97.9 F (36.6 C), temperature source Oral, resp. rate 18, height 5\' 3"  (1.6 m), weight 44.7 kg, SpO2 99 %. General appearance: alert, cooperative, and no distress Incision/Wound:neck incision clean, no hematoma, voice normal  Disposition: Discharge disposition: 01-Home or Self Care        Allergies as of 01/24/2021   No Known Allergies      Medication List     TAKE these medications    amoxicillin 500 MG tablet Commonly known as: AMOXIL Take 2,000 mg by mouth as directed. Per pt takes prior to dental procedures   aspirin 81 MG tablet Take 1 tablet (81 mg total) by mouth daily.   aspirin-acetaminophen-caffeine 250-250-65 MG tablet Commonly known as: EXCEDRIN MIGRAINE Take 1 tablet by mouth every 6 (six) hours as needed for headache.   cetirizine 10 MG tablet Commonly known as: ZYRTEC Take 10 mg by mouth daily as needed for allergies.   famotidine 10 MG tablet Commonly known as: PEPCID Take 10 mg by mouth daily as needed for heartburn or indigestion.   fluticasone 50 MCG/ACT nasal spray Commonly known as: FLONASE Place 1 spray into both nostrils daily as needed for allergies or rhinitis.   irbesartan 150 MG tablet Commonly known as: AVAPRO TAKE 1 TABLET(150 MG) BY MOUTH DAILY   levocetirizine 5 MG tablet Commonly known as: XYZAL Take 5 mg by mouth at bedtime.    levothyroxine 88 MCG tablet Commonly known as: SYNTHROID Take 88 mcg by mouth daily before breakfast.   LUBRICANT EYE DROPS OP Place 2 drops into both eyes daily as needed (dry eyes).   medroxyPROGESTERone 2.5 MG tablet Commonly known as: PROVERA Take 1 tablet (2.5 mg total) by mouth daily.   traMADol 50 MG tablet Commonly known as: ULTRAM Take 1-2 tablets (50-100 mg total) by mouth every 6 (six) hours as needed for moderate pain.        Follow-up Information     Armandina Gemma, MD. Schedule an appointment as soon as possible for a visit in 3 week(s).   Specialty: General Surgery Why: For wound re-check Contact information: [place] [address] [phone]                 Signed: Coralie Keens 01/24/2021, 9:57 AM

## 2021-01-24 NOTE — Progress Notes (Signed)
Patient ID: Brenda Cobb, female   DOB: 1945/06/21, 75 y.o.   MRN: [id number]   Doing well No complaints Incision clean Ca++ stable  Plan: Discharge home

## 2021-01-26 LAB — SURGICAL PATHOLOGY

## 2021-02-12 ENCOUNTER — Ambulatory Visit (HOSPITAL_COMMUNITY): Payer: Medicare Other | Attending: Cardiology

## 2021-02-12 ENCOUNTER — Other Ambulatory Visit: Payer: Self-pay

## 2021-02-12 DIAGNOSIS — I340 Nonrheumatic mitral (valve) insufficiency: Secondary | ICD-10-CM | POA: Diagnosis present

## 2021-02-12 LAB — ECHOCARDIOGRAM COMPLETE
Area-P 1/2: 4.21 cm2
MV M vel: 5.97 m/s
MV Peak grad: 142.7 mmHg
Radius: 0.7 cm
S' Lateral: 3.6 cm

## 2021-02-24 ENCOUNTER — Ambulatory Visit (INDEPENDENT_AMBULATORY_CARE_PROVIDER_SITE_OTHER): Payer: Medicare Other | Admitting: Cardiovascular Disease

## 2021-02-24 ENCOUNTER — Encounter: Payer: Self-pay | Admitting: Cardiovascular Disease

## 2021-02-24 ENCOUNTER — Other Ambulatory Visit: Payer: Self-pay

## 2021-02-24 VITALS — BP 126/70 | HR 71 | Ht 63.0 in | Wt 102.8 lb

## 2021-02-24 DIAGNOSIS — I341 Nonrheumatic mitral (valve) prolapse: Secondary | ICD-10-CM

## 2021-02-24 DIAGNOSIS — E785 Hyperlipidemia, unspecified: Secondary | ICD-10-CM | POA: Insufficient documentation

## 2021-02-24 DIAGNOSIS — I340 Nonrheumatic mitral (valve) insufficiency: Secondary | ICD-10-CM

## 2021-02-24 DIAGNOSIS — E782 Mixed hyperlipidemia: Secondary | ICD-10-CM

## 2021-02-24 NOTE — Patient Instructions (Signed)
Medication Instructions:  Your physician recommends that you continue on your current medications as directed. Please refer to the Current Medication list given to you today.  *If you need a refill on your cardiac medications before your next appointment, please call your pharmacy*   Testing/Procedures: Your physician has requested that you have an echocardiogram. Echocardiography is a painless test that uses sound waves to create images of your heart. It provides your doctor with information about the size and shape of your heart and how well your hearts chambers and valves are working. This procedure takes approximately one hour. There are no restrictions for this procedure. To be done in December 2023. This procedure is done at [address].    Follow-Up: At [facility], you and your health needs are our priority.  As part of our continuing mission to provide you with exceptional heart care, we have created designated Provider Care Teams.  These Care Teams include your primary Cardiologist (physician) and Advanced Practice Providers (APPs -  Physician Assistants and Nurse Practitioners) who all work together to provide you with the care you need, when you need it.  We recommend signing up for the patient portal called "MyChart".  Sign up information is provided on this After Visit Summary.  MyChart is used to connect with patients for Virtual Visits (Telemedicine).  Patients are able to view lab/test results, encounter notes, upcoming appointments, etc.  Non-urgent messages can be sent to your provider as well.   To learn more about what you can do with MyChart, go to [email or web address].    Your next appointment:   12 month(s)  The format for your next appointment:   In Person  Provider:   Quay Burow, MD

## 2021-02-24 NOTE — Assessment & Plan Note (Signed)
History of mild hyperlipidemia with lipid profile performed 08/20/2020 revealing total cholesterol 230, LDL 124 and HDL 94.  She has a very healthy diet.  At this point, I do not feel compelled to begin her on a statin drug.

## 2021-02-24 NOTE — Assessment & Plan Note (Signed)
History of severe MR with recent 2D echo performed 02/12/2021 showed severe bileaflet prolapse.  Her LV size and function are normal.  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  She is completely asymptomatic.  I did refer her to Dr. Roxy Manns who felt that she clinically probably was not at the point that she required mitral valve repair.  We will continue to follow her noninvasively.

## 2021-02-24 NOTE — Progress Notes (Signed)
02/24/2021 Brenda Cobb   10/06/1945  [id number]  Primary Physician Princess Bruins, MD Primary Cardiologist: Lorretta Harp MD FACP, [place], [place], [place]  HPI:  Brenda Cobb is a 75 y.o.  thin and fit-appearing, married Caucasian female with no children who I last saw in the office 08/20/2020.Marland Kitchen She has a history of bileaflet severe mitral valve prolapse with regurgitation which she is totally asymptomatic from. She also has mild hyperlipidemia. Her recent 2D echo performed January 07, 2012, revealed normal LV size and function with mild concentric LVH, mildly dilated left atrium of 42 mm. Her echo really has not changed significantly since it was done prior to that.since I saw her a year ago she is completely asymptomatic. Recent 2-D echocardiogram performed  05/17/2019 showed severe bileaflet prolapse with moderate to severe MR and basal septal hypertrophy with severe left atrial enlargement unchanged from her prior echo.  She just got a new puppy, Springer spaniel, and [place] several miles at a time without limitation or symptoms.   She walks 3 miles a day 5 days a week without symptoms with her springer spaniel.  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   She had a cardiac MRI that did not show an intra-atrial mass.  She also saw Dr.Owen for cardiothoracic surgical evaluation for minimal invasive mitral valve repair.  He thought she was an acceptable candidate.  She is scheduled to have a TEE but this never was performed.  She remains asymptomatic walking 3 miles at a time with her new puppy.  Her LV function has remained normal in size and function.  She does have a uterine fibroid which they are watching and following conservatively.  She had a parathyroid removed by Dr. Harlow Asa.  Since I saw her 6 months ago she is  remained stable.  She is continue to remain active, walking 5 days a week with her springer spaniel.  She is completely asymptomatic.   Current Meds  Medication Sig   amoxicillin (AMOXIL) 500 MG tablet Take 2,000 mg by mouth as directed. Per pt takes prior to dental procedures   aspirin 81 MG tablet Take 1 tablet (81 mg total) by mouth daily.   aspirin-acetaminophen-caffeine (EXCEDRIN MIGRAINE) 250-250-65 MG tablet Take 1 tablet by mouth every 6 (six) hours as needed for headache.   cetirizine (ZYRTEC) 10 MG tablet Take 10 mg by mouth daily as needed for allergies.   famotidine (PEPCID) 10 MG tablet Take 10 mg by mouth daily as needed for heartburn or indigestion.   fluticasone (FLONASE) 50 MCG/ACT nasal spray Place 1 spray into both nostrils daily as needed for allergies or rhinitis.   irbesartan (AVAPRO) 150 MG tablet TAKE 1 TABLET(150 MG) BY MOUTH DAILY   levocetirizine (XYZAL) 5 MG tablet Take 5 mg by mouth at bedtime.   levothyroxine (SYNTHROID) 88 MCG tablet Take 88 mcg by mouth daily before breakfast.   medroxyPROGESTERone (PROVERA) 2.5 MG tablet Take 1 tablet (2.5 mg total) by mouth daily.     No Known Allergies  Social History   Socioeconomic History   Marital status: Widowed    Spouse name: Not on file   Number of children: Not on file   Years of education: Not on file   Highest education level: Not on file  Occupational History  Not on file  Tobacco Use   Smoking status: Never   Smokeless tobacco: Never  Vaping Use   Vaping Use: Never used  Substance and Sexual Activity   Alcohol use: Yes    Comment: socially   Drug use: Never   Sexual activity: Not Currently    Birth control/protection: Post-menopausal    Comment: 1st intercourse 75 yo-Fewer than 5 partners  Other Topics Concern   Not on file  Social History Narrative   Not on file   Social Determinants of Health   Financial Resource Strain: Not on file  Food Insecurity: Not on file  Transportation  Needs: Not on file  Physical Activity: Not on file  Stress: Not on file  Social Connections: Not on file  Intimate Partner Violence: Not on file     Review of Systems: General: negative for chills, fever, night sweats or weight changes.  Cardiovascular: negative for chest pain, dyspnea on exertion, edema, orthopnea, palpitations, paroxysmal nocturnal dyspnea or shortness of breath Dermatological: negative for rash Respiratory: negative for cough or wheezing Urologic: negative for hematuria Abdominal: negative for nausea, vomiting, diarrhea, bright red blood per rectum, melena, or hematemesis Neurologic: negative for visual changes, syncope, or dizziness All other systems reviewed and are otherwise negative except as noted above.    Blood pressure 126/70, pulse 71, height 5\' 3"  (1.6 m), weight 102 lb 12.8 oz (46.6 kg).  General appearance: alert and no distress Neck: no adenopathy, no carotid bruit, no JVD, supple, symmetrical, trachea midline, and thyroid not enlarged, symmetric, no tenderness/mass/nodules Lungs: clear to auscultation bilaterally Heart: 3/6 apical systolic murmur consistent with severe mitral regurgitation. Extremities: extremities normal, atraumatic, no cyanosis or edema Pulses: 2+ and symmetric Skin: Skin color, texture, turgor normal. No rashes or lesions Neurologic: Grossly normal  EKG sinus rhythm at 71 with frequent PVCs.  I personally reviewed this EKG.  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  Her LV size and function are normal.  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  She is completely asymptomatic.  I did refer her to Dr. Roxy Manns who felt that she clinically probably was not at the point that she required mitral valve repair.  We will continue to follow her noninvasively.  Hyperlipidemia History of mild hyperlipidemia with lipid  profile performed 08/20/2020 revealing total cholesterol 230, LDL 124 and HDL 94.  She has a very healthy diet.  At this point, I do not feel compelled to begin her on a statin drug.     Lorretta Harp MD FACP,FACC,FAHA, [facility] 02/24/2021 10:10 AM

## 2021-02-26 ENCOUNTER — Other Ambulatory Visit: Payer: Medicare Other

## 2021-02-26 ENCOUNTER — Other Ambulatory Visit: Payer: Medicare Other | Admitting: Obstetrics & Gynecology

## 2021-03-14 ENCOUNTER — Other Ambulatory Visit: Payer: Self-pay | Admitting: Obstetrics & Gynecology

## 2021-03-14 DIAGNOSIS — N8501 Benign endometrial hyperplasia: Secondary | ICD-10-CM

## 2021-03-17 NOTE — Telephone Encounter (Signed)
Per notes from visit 12/04/20. To take daily up until [place] on 03/26/21.

## 2021-03-26 ENCOUNTER — Encounter: Payer: Self-pay | Admitting: Obstetrics & Gynecology

## 2021-03-26 ENCOUNTER — Ambulatory Visit (INDEPENDENT_AMBULATORY_CARE_PROVIDER_SITE_OTHER): Payer: Medicare Other

## 2021-03-26 ENCOUNTER — Other Ambulatory Visit: Payer: Self-pay

## 2021-03-26 ENCOUNTER — Ambulatory Visit (INDEPENDENT_AMBULATORY_CARE_PROVIDER_SITE_OTHER): Payer: Medicare Other | Admitting: Obstetrics & Gynecology

## 2021-03-26 ENCOUNTER — Ambulatory Visit: Payer: Medicare Other | Admitting: Obstetrics & Gynecology

## 2021-03-26 DIAGNOSIS — R1900 Intra-abdominal and pelvic swelling, mass and lump, unspecified site: Secondary | ICD-10-CM | POA: Diagnosis not present

## 2021-03-26 DIAGNOSIS — N8501 Benign endometrial hyperplasia: Secondary | ICD-10-CM

## 2021-03-26 NOTE — Progress Notes (Signed)
° ° °  Brenda Cobb 1945/07/08 [id number]        76 y.o.  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  On Provera 2.5 mg PO daily since then.  No recurrence of PMB.  No pelvic pain.   OB History  Gravida Para Term Preterm AB Living  0            SAB IAB Ectopic Multiple Live Births               Past medical history,surgical history, problem list, medications, allergies, family history and social history were all reviewed and documented in the EPIC chart.   Directed ROS with pertinent positives and negatives documented in the history of present illness/assessment and plan.  Exam:  Vitals:   03/26/21 1024  BP: 106/62  Pulse: 72  Resp: 18   General appearance:  Normal  Pelvic US today: T/V images.  Trace amount of fluid noted in the cervix within normal limits.  Anteverted uterus normal in size and shape with a lateral fibroid again noted measuring approximately 10 x 9.5 cm.  The fibroid is slightly decreased in size compared to last ultrasound when it was measured at 11.7 x 10 cm.  The overall uterine size is measured at 7.65 x 2.16 x 3.61 cm.  Thin symmetrical endometrial lining measured at 3.38 mm.  No obvious mass or abnormal thickening seen.  Right ovary not visualized.  Left ovary is atrophic in appearance.  No adnexal mass seen.  Small amount of free fluid noted in the right adnexa.   Assessment/Plan:  76 y.o. G0  1. 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  On Provera 2.5 mg PO daily since then.  No recurrence of PMB.  No pelvic pain.  Pelvic US findings thoroughly reviewed with patient.  Thin endometrial line at 3.38 mm.  Reassured.  Will continue Provera 2.5 mg daily for an additional month (total of 6 months) and observe.  F/U in 6 months for a repeat Pelvic US off of the Provera to reassess the endometrial line. - US  Transvaginal Non-OB; Future   Princess Bruins MD, 10:48 AM 03/26/2021

## 2021-04-30 ENCOUNTER — Encounter: Payer: Self-pay | Admitting: Gastroenterology

## 2021-05-16 ENCOUNTER — Other Ambulatory Visit: Payer: Self-pay | Admitting: Cardiovascular Disease

## 2021-05-27 ENCOUNTER — Other Ambulatory Visit: Payer: Self-pay

## 2021-05-27 ENCOUNTER — Ambulatory Visit (AMBULATORY_SURGERY_CENTER): Payer: Medicare Other | Admitting: *Deleted

## 2021-05-27 VITALS — Ht 63.0 in | Wt 100.0 lb

## 2021-05-27 DIAGNOSIS — Z1211 Encounter for screening for malignant neoplasm of colon: Secondary | ICD-10-CM

## 2021-05-27 MED ORDER — NA SULFATE-K SULFATE-MG SULF 17.5-3.13-1.6 GM/177ML PO SOLN: 1.0000 | Freq: Once | ORAL | 0 refills | Status: AC

## 2021-05-27 NOTE — Progress Notes (Signed)

## 2021-06-10 ENCOUNTER — Other Ambulatory Visit: Payer: Self-pay

## 2021-06-10 ENCOUNTER — Ambulatory Visit (AMBULATORY_SURGERY_CENTER): Payer: Medicare Other | Admitting: Gastroenterology

## 2021-06-10 ENCOUNTER — Encounter: Payer: Self-pay | Admitting: Gastroenterology

## 2021-06-10 VITALS — BP 141/84 | HR 100 | Temp 96.2°F | Resp 22 | Ht 63.0 in | Wt 100.0 lb

## 2021-06-10 DIAGNOSIS — Z1211 Encounter for screening for malignant neoplasm of colon: Secondary | ICD-10-CM | POA: Diagnosis not present

## 2021-06-10 MED ORDER — SODIUM CHLORIDE 0.9 % IV SOLN: 500.0000 mL | Freq: Once | INTRAVENOUS | Status: DC

## 2021-06-10 NOTE — Progress Notes (Signed)
Pt has passed very little gas.  After 20 minutes on the monitor.  Pt was awake.  She was taken to the restroom and explained the importance in passing flatus.  Her abdomin was soft and she denied any pain or discomfort.  Pt reported she did pass more air and also belching too.   ?

## 2021-06-10 NOTE — Progress Notes (Signed)
Pt BP dramatically dropped.  Retook BP to confirm, dropped head to steep tburg and started dosing ephedrine.  Sedation completely stopped and pt allowd to wake up.  Procedure stopped.  Dr C aware ?

## 2021-06-10 NOTE — Progress Notes (Signed)
Pt's states no medical or surgical changes since previsit or office visit.   VS completed by DT.  

## 2021-06-10 NOTE — Progress Notes (Signed)
[facility] History and Physical ? ? ?Primary Care Physician:  Pcp, No ? ? ?Reason for Procedure:   Colon cancer screening ? ?Plan:    Screening colonoscopy ? ? ? ? ?HPI: Brenda Cobb is a 76 y.o. female undergoing average risk screening colonoscopy.  She has no family history of colon cancer and no chronic GI symptoms. She had a colonoscopy over 10 years ago which was normal per patient. ? ? ?Past Medical History:  ?Diagnosis Date  ? Allergy   ? SEASONAL  ? Endometrial polyp   ? History of COVID-19 07/22/2020  ? positive test result in epic, pt was tested prior to procedure, per pt asymptomatic  ? 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 post RAI  ? Hyperlipidemia   ? Hypertension   ? PT.DENIES AS OF 05/27/21,STATED "IT RUNS LOW"  ? Hypothyroidism, postradioiodine therapy   ? endocrinologist--  dr Chalmers Cater---  post RAI 10/ 2007 for grave's  ? 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 cardiologist--- dr berry---  long hx of known mvp, requires antibiotic prophylaxis;  last echo in epic 05-21-2020 ef 60-65%, G2DD, bileaflet MV prolapse w/ severe MR, mild-mod TR, severe LAE;  per cardiology note pt is asymptomatic walks several miles daily  ? Osteoporosis   ? "RIGHT HIP BONE DENSITY LOSS AS OF 05/27/21",NOT ON MEDICATIONS"  ? Primary hyperparathyroidism ([place])   ? endocrinologist--- dr Chalmers Cater  ? Wears contact lenses   ? ? ?Past Surgical History:  ?Procedure Laterality Date  ? BREAST BIOPSY Left 2018  ? Benign  ? BREAST SURGERY Left 1990  ? Benign breast cyst  ? COLONOSCOPY    ? last one 2012 approx  ? DILATATION & CURETTAGE/HYSTEROSCOPY WITH MYOSURE N/A 11/18/2020  ? Procedure: [place];  Surgeon: Princess Bruins, MD;  Location: [facility];  Service: Gynecology;  Laterality: N/A;  ? PARATHYROIDECTOMY N/A 01/23/2021  ? Procedure: NECK EXPLORATION WITH PARATHYROIDECTOMY;  Surgeon: Armandina Gemma, MD;  Location: [facility];  Service: General;  Laterality: N/A;   ? ? ?Prior to Admission medications   ?Medication Sig Start Date End Date Taking? Authorizing Provider  ?aspirin 81 MG tablet Take 1 tablet (81 mg total) by mouth daily. 05/12/18  Yes Lorretta Harp, MD  ?irbesartan (AVAPRO) 150 MG tablet TAKE 1 TABLET(150 MG) BY MOUTH DAILY 05/18/21  Yes Lorretta Harp, MD  ?levocetirizine (XYZAL) 5 MG tablet Take 5 mg by mouth at bedtime.   Yes [provider]  ?levothyroxine (SYNTHROID) 88 MCG tablet Take 88 mcg by mouth daily before breakfast.   Yes [provider]  ?amoxicillin (AMOXIL) 500 MG tablet Take 2,000 mg by mouth as directed. Per pt takes prior to dental procedures    [provider]  ?aspirin-acetaminophen-caffeine (EXCEDRIN MIGRAINE) [phone] MG tablet Take 1 tablet by mouth every 6 (six) hours as needed for headache.    [provider]  ?cetirizine (ZYRTEC) 10 MG tablet Take 10 mg by mouth daily as needed for allergies.    [provider]  ?famotidine (PEPCID) 10 MG tablet Take 10 mg by mouth daily as needed for heartburn or indigestion.    [provider]  ?fluticasone (FLONASE) 50 MCG/ACT nasal spray Place 1 spray into both nostrils daily as needed for allergies or rhinitis.    [provider]  ?medroxyPROGESTERone (PROVERA) 2.5 MG tablet TAKE 1 TABLET(2.5 MG) BY MOUTH DAILY 03/18/21   Princess Bruins, MD  ? ? ?Current Outpatient Medications  ?  Medication Sig Dispense Refill  ? aspirin 81 MG tablet Take 1 tablet (81 mg total) by mouth daily. 90 tablet 3  ? irbesartan (AVAPRO) 150 MG tablet TAKE 1 TABLET(150 MG) BY MOUTH DAILY 90 tablet 3  ? levocetirizine (XYZAL) 5 MG tablet Take 5 mg by mouth at bedtime.    ? levothyroxine (SYNTHROID) 88 MCG tablet Take 88 mcg by mouth daily before breakfast.    ? amoxicillin (AMOXIL) 500 MG tablet Take 2,000 mg by mouth as directed. Per pt takes prior to dental procedures    ? aspirin-acetaminophen-caffeine (EXCEDRIN MIGRAINE) 250-250-65 MG tablet Take 1  tablet by mouth every 6 (six) hours as needed for headache.    ? cetirizine (ZYRTEC) 10 MG tablet Take 10 mg by mouth daily as needed for allergies.    ? famotidine (PEPCID) 10 MG tablet Take 10 mg by mouth daily as needed for heartburn or indigestion.    ? fluticasone (FLONASE) 50 MCG/ACT nasal spray Place 1 spray into both nostrils daily as needed for allergies or rhinitis.    ? medroxyPROGESTERone (PROVERA) 2.5 MG tablet TAKE 1 TABLET(2.5 MG) BY MOUTH DAILY 30 tablet 0  ? ?Current Facility-Administered Medications  ?Medication Dose Route Frequency Provider Last Rate Last Admin  ? 0.9 %  sodium chloride infusion  500 mL Intravenous Once Daryel November, MD      ? ? ?Allergies as of 06/10/2021  ? (No Known Allergies)  ? ? ?Family History  ?Problem Relation Age of Onset  ? Alzheimer's disease Father   ? Aneurysm Brother   ? Breast cancer Maternal Aunt 55  ? ALS Maternal Grandmother   ? Parkinson's disease Maternal Grandfather   ? Breast cancer Paternal Grandmother 39  ? Colon cancer Neg Hx   ? Colon polyps Neg Hx   ? Esophageal cancer Neg Hx   ? Rectal cancer Neg Hx   ? Stomach cancer Neg Hx   ? ? ?Social History  ? ?Socioeconomic History  ? Marital status: Widowed  ?  Spouse name: Not on file  ? Number of children: Not on file  ? Years of education: Not on file  ? Highest education level: Not on file  ?Occupational History  ? Not on file  ?Tobacco Use  ? Smoking status: Never  ?  Passive exposure: Past ("MOTHER DID")  ? Smokeless tobacco: Never  ?Vaping Use  ? Vaping Use: Never used  ?Substance and Sexual Activity  ? Alcohol use: Yes  ?  Comment: socially  ? Drug use: Never  ? Sexual activity: Not Currently  ?  Birth control/protection: Post-menopausal  ?  Comment: 1st intercourse 76 yo-Fewer than 5 partners  ?Other Topics Concern  ? Not on file  ?Social History Narrative  ? Not on file  ? ?Social Determinants of Health  ? ?Financial Resource Strain: Not on file  ?Food Insecurity: Not on file  ?Transportation  Needs: Not on file  ?Physical Activity: Not on file  ?Stress: Not on file  ?Social Connections: Not on file  ?Intimate Partner Violence: Not on file  ? ? ?Review of Systems: ? ?All other review of systems negative except as mentioned in the HPI. ? ?Physical Exam: ?Vital signs ?BP 128/65   Pulse 74   Temp (!) 96.2 ?F (35.7 ?C) (Temporal)   Ht '5\' 3"'$  (1.6 m)   Wt 100 lb (45.4 kg)   SpO2 92%   BMI 17.71 kg/m?  ? ?General:   Alert,  Well-developed, well-nourished, pleasant and  cooperative in NAD ?Airway:  Mallampati 3 ?Lungs:  Clear throughout to auscultation.   ?Heart:  Regular rate and rhythm; Systolic murmur, ectopic beats ?Abdomen:  Soft, nontender and nondistended. Normal bowel sounds.   ?Neuro/Psych:  Normal mood and affect. A and O x 3 ? ? ?Alida Greiner E. Candis Schatz, MD ?[facility] ? ?

## 2021-06-10 NOTE — Op Note (Signed)
[facility] ?Patient Name: Brenda Cobb ?Procedure Date: 06/10/2021 8:20 AM ?MRN: [id number] ?Endoscopist: Clarrissa Shimkus E. Candis Schatz , MD ?Age: 76 ?Referring MD:  ?Date of Birth: [birth year] ?Gender: Female ?Account #: [id number] ?Procedure:                Colonoscopy ?Indications:              Screening for colorectal malignant neoplasm (last  ?                          colonoscopy was more than 10 years ago) ?Medicines:                Monitored Anesthesia Care ?Procedure:                Pre-Anesthesia Assessment: ?                          - Prior to the procedure, a History and Physical  ?                          was performed, and patient medications and  ?                          allergies were reviewed. The patient's tolerance of  ?                          previous anesthesia was also reviewed. The risks  ?                          and benefits of the procedure and the sedation  ?                          options and risks were discussed with the patient.  ?                          All questions were answered, and informed consent  ?                          was obtained. Prior Anticoagulants: The patient has  ?                          taken no previous anticoagulant or antiplatelet  ?                          agents. ASA Grade Assessment: III - A patient with  ?                          severe systemic disease. After reviewing the risks  ?                          and benefits, the patient was deemed in  ?                          satisfactory condition to undergo the procedure. ?  After obtaining informed consent, the colonoscope  ?                          was passed under direct vision. Throughout the  ?                          procedure, the patient's blood pressure, pulse, and  ?                          oxygen saturations were monitored continuously. The  ?                          PCF-HQ190L Colonoscope was introduced through the  ?                          anus and  advanced to the the cecum, identified by  ?                          appendiceal orifice and ileocecal valve. The  ?                          colonoscopy was unusually difficult due to  ?                          restricted mobility of the colon and a tortuous  ?                          colon. Successful completion of the procedure was  ?                          aided by using manual pressure and withdrawing the  ?                          scope and replacing with the ultraslim colonoscope.  ?                          The patient tolerated the procedure. The 6256  ?                          PCF-[id number] Slim SB Colonoscope was introduced  ?                          through the anus and advanced to the the cecum,  ?                          identified by appendiceal orifice and ileocecal  ?                          valve. The bowel preparation used was SUPREP via  ?                          split dose instruction. The patient started  ?  coughing almost immediately after sedation was  ?                          began, and coughed constantly throughout the  ?                          procedure. Her oxygen saturations remained normal  ?                          throughout the procedure and there was no suspicion  ?                          for aspiration. Shortly after cecal intubation, the  ?                          patient's blood pressure dropped precipitiously  ?                          with systolics transiently in the 60s. She received  ?                          15 mg phenylephrine and her BP improved into the  ?                          140s. Because of the hemodynamic instability and  ?                          coughing, an expedited examination of the colon was  ?                          performed. ?Scope In: 8:41:50 AM ?Scope Out: 9:05:09 AM ?Scope Withdrawal Time: 0 hours 4 minutes 40 seconds  ?Total Procedure Duration: 0 hours 23 minutes 19 seconds  ?Findings:                 The  perianal and digital rectal examinations were  ?                          normal. Pertinent negatives include normal  ?                          sphincter tone and no palpable rectal lesions. ?                          The colon (entire examined portion) appeared normal. ?Complications:            No immediate complications. ?Estimated Blood Loss:     Estimated blood loss: none. ?Impression:               - The entire examined colon is normal. ?                          - No specimens collected. ?Recommendation:           - Patient has a contact number available for  ?  emergencies. The signs and symptoms of potential  ?                          delayed complications were discussed with the  ?                          patient. Return to normal activities tomorrow.  ?                          Written discharge instructions were provided to the  ?                          patient. ?                          - Resume previous diet. ?                          - Await pathology results. ?                          - Given normal colonoscopy and advanced age,  ?                          recommend against further colon cancer screening. ?Myia Bergh E. Candis Schatz, MD ?06/10/2021 9:17:42 AM ?This report has been signed electronically. ?

## 2021-06-10 NOTE — Progress Notes (Signed)
During insertion with initial peds scope, close to time to stopping and switching scopes, pt coughing non stop.  Sedation halted to allow pt to wake up and follow commands to swallow.  No fluid on pillow or in mouth even with suctioning.  Pt alert enough to follow commands before resdation and continue procedure ? ?

## 2021-06-10 NOTE — Patient Instructions (Addendum)
?Resume your current medications today. ?No pathology from colonoscopy today. ?No repeat colonoscopy due to normal colon and age. ?Please call if any questions or concerns. ?  ? ? ?YOU HAD AN ENDOSCOPIC PROCEDURE TODAY AT [facility]:   Refer to the procedure report that was given to you for any specific questions about what was found during the examination.  If the procedure report does not answer your questions, please call your gastroenterologist to clarify.  If you requested that your care partner not be given the details of your procedure findings, then the procedure report has been included in a sealed envelope for you to review at your convenience later. ? ?YOU SHOULD EXPECT: Some feelings of bloating in the abdomen. Passage of more gas than usual.  Walking can help get rid of the air that was put into your GI tract during the procedure and reduce the bloating. If you had a lower endoscopy (such as a colonoscopy or flexible sigmoidoscopy) you may notice spotting of blood in your stool or on the toilet paper. If you underwent a bowel prep for your procedure, you may not have a normal bowel movement for a few days. ? ?Please Note:  You might notice some irritation and congestion in your nose or some drainage.  This is from the oxygen used during your procedure.  There is no need for concern and it should clear up in a day or so. ? ?SYMPTOMS TO REPORT IMMEDIATELY: ? ?Following lower endoscopy (colonoscopy or flexible sigmoidoscopy): ? Excessive amounts of blood in the stool ? Significant tenderness or worsening of abdominal pains ? Swelling of the abdomen that is new, acute ? Fever of 100?F or higher ? ? ?For urgent or emergent issues, a gastroenterologist can be reached at any hour by calling [phone]. ?Do not use MyChart messaging for urgent concerns.  ? ? ?DIET:  We do recommend a small meal at first, but then you may proceed to your regular diet.  Drink plenty of fluids but you should  avoid alcoholic beverages for 24 hours. ? ?ACTIVITY:  You should plan to take it easy for the rest of today and you should NOT DRIVE or use heavy machinery until tomorrow (because of the sedation medicines used during the test).   ? ?FOLLOW UP: ?Our staff will call the number listed on your records 48-72 hours following your procedure to check on you and address any questions or concerns that you may have regarding the information given to you following your procedure. If we do not reach you, we will leave a message.  We will attempt to reach you two times.  During this call, we will ask if you have developed any symptoms of COVID 19. If you develop any symptoms (ie: fever, flu-like symptoms, shortness of breath, cough etc.) before then, please call [phone].  If you test positive for Covid 19 in the 2 weeks post procedure, please call and report this information to [place].   ? ?If any biopsies were taken you will be contacted by phone or by letter within the next 1-3 weeks.  Please call us at [phone] if you have not heard about the biopsies in 3 weeks.  ? ? ?SIGNATURES/CONFIDENTIALITY: ?You and/or your care partner have signed paperwork which will be entered into your electronic medical record.  These signatures attest to the fact that that the information above on your After Visit Summary has been reviewed and is understood.  Full responsibility of the  confidentiality of this discharge information lies with you and/or your care-partner.  ?

## 2021-06-12 ENCOUNTER — Telehealth: Payer: Self-pay | Admitting: *Deleted

## 2021-06-12 NOTE — Telephone Encounter (Signed)
?  Follow up Call- ? ? ?  06/10/2021  ?  8:08 AM  ?Call back number  ?Post procedure Call Back phone  # [phone]  ?Permission to leave phone message Yes  ?  ? ?Patient questions: ? ?Do you have a fever, pain , or abdominal swelling? No. ?Pain Score  0 * ? ?Have you tolerated food without any problems? Yes.   ? ?Have you been able to return to your normal activities? Yes.   ? ?Do you have any questions about your discharge instructions: ?Diet   No. ?Medications  No. ?Follow up visit  No. ? ?Do you have questions or concerns about your Care? No. ? ?Actions: ?* If pain score is 4 or above: ?No action needed, pain <4. ? ? ?

## 2021-07-09 ENCOUNTER — Encounter: Payer: Self-pay | Admitting: Obstetrics & Gynecology

## 2021-09-02 ENCOUNTER — Telehealth: Payer: Self-pay | Admitting: Cardiovascular Disease

## 2021-09-02 ENCOUNTER — Other Ambulatory Visit: Payer: Self-pay | Admitting: Cardiovascular Disease

## 2021-09-02 MED ORDER — AMOXICILLIN 500 MG PO TABS: 2000.0000 mg | ORAL_TABLET | ORAL | 1 refills | Status: DC

## 2021-09-02 NOTE — Telephone Encounter (Signed)
*  STAT* If patient is at the pharmacy, call can be transferred to refill team.   1. Which medications need to be refilled? (please list name of each medication and dose if known) amoxicillin (AMOXIL) 500 MG tablet  2. Which pharmacy/location (including street and city if local pharmacy) is medication to be sent to? [facility] [phone] - [place], [address] AT [place]  3. Do they need a 30 day or 90 day supply? Needs for routine dental cleaning

## 2021-11-05 ENCOUNTER — Encounter: Payer: Self-pay | Admitting: Obstetrics & Gynecology

## 2021-11-05 ENCOUNTER — Ambulatory Visit (INDEPENDENT_AMBULATORY_CARE_PROVIDER_SITE_OTHER): Payer: Medicare Other

## 2021-11-05 ENCOUNTER — Other Ambulatory Visit (HOSPITAL_COMMUNITY)
Admission: RE | Admit: 2021-11-05 | Discharge: 2021-11-05 | Disposition: A | Discharge status: Home / Self Care | Payer: Medicare Other | Source: Ambulatory Visit | Attending: Obstetrics & Gynecology | Admitting: Obstetrics & Gynecology

## 2021-11-05 ENCOUNTER — Ambulatory Visit (INDEPENDENT_AMBULATORY_CARE_PROVIDER_SITE_OTHER): Payer: Medicare Other | Admitting: Obstetrics & Gynecology

## 2021-11-05 VITALS — BP 120/84 | HR 68

## 2021-11-05 DIAGNOSIS — N8501 Benign endometrial hyperplasia: Secondary | ICD-10-CM | POA: Diagnosis present

## 2021-11-05 DIAGNOSIS — R9389 Abnormal findings on diagnostic imaging of other specified body structures: Secondary | ICD-10-CM | POA: Insufficient documentation

## 2021-11-05 NOTE — Progress Notes (Signed)
    Brenda Cobb 1945-07-17 [id number]        76 y.o.  G0  RP: H/O Simple Endometrial Hyperplasia for Pelvic US with EBx  HPI: H/O Simple Endometrial Hyperplasia.  Endometrial Polyp removed by [facility] in 2022.   OB History  Gravida Para Term Preterm AB Living  0            SAB IAB Ectopic Multiple Live Births               Past medical history,surgical history, problem list, medications, allergies, family history and social history were all reviewed and documented in the EPIC chart.   Directed ROS with pertinent positives and negatives documented in the history of present illness/assessment and plan.  Exam:  Vitals:   11/05/21 0841  BP: 120/84  Pulse: 68   General appearance:  Normal  Pelvic US today: T/V images.  Anteverted uterus normal in size and shape except for a large posterior right lateral fibroid measured at 11.8 x 10.2 cm.  The uterus without the fibroid is measured at 7.48 x 5.25 x 3.93 cm.  Asymmetrically thickened heterogeneous endometrium measured at 21.6 mm at the widest point.  The endometrium is minimally vascular.  The left ovary is atrophic in appearance with small avascular calcifications.  Positive perfusion to the right ovary is not visualized.  Possible right hydrosalpinx showing as an anechoic tubular structure with no color Doppler activity.  No adnexal mass.  Small volume of free fluid in the right adnexa and posterior to the large fibroid.  Written consent for EBx obtained:  Speculum inserted in the vagina.  Betadine prep of the cervix.  Hurricane spray.  Tenaculum on the anterior lip of the cervix.  Os finder.  Endometrial biopsy with the pipette.  Moderate material sent to pathology.  Instruments removed.  Well tolerated with no Cx.  Post procedure precautions.    Assessment/Plan:  76 y.o. G0P0   1. Thickened endometrium  - Surgical pathology( [facility]/ [facility]) - [place] Sonohysterogram; Future  2. Simple endometrial hyperplasia  - Surgical  pathology( [facility])  Other orders - SYNTHROID 100 MCG tablet; Take 100 mcg by mouth. Take Saturday & sunday   Princess Bruins MD, 8:48 AM 11/05/2021

## 2021-11-06 LAB — SURGICAL PATHOLOGY

## 2021-11-11 ENCOUNTER — Telehealth: Payer: Self-pay

## 2021-11-11 NOTE — Telephone Encounter (Signed)
[facility] appt scheduled for 12/24/21 at 10:30am.

## 2021-11-11 NOTE — Telephone Encounter (Signed)
I called patient and informed her that Dr. Marguerita Merles wants her to schedule [facility]. Procedure and reason for doing it briefly explained. Patient is agreeable.  Message routed to appt desk to call her to schedule [facility] with Dr. Marguerita Merles.

## 2021-11-11 NOTE — Telephone Encounter (Signed)
Schedule Sonohysto Received: Today Princess Bruins, MD  [facility] Triage Very thickened endometrial line with benign EBx.  H/O Polyp.

## 2021-11-12 ENCOUNTER — Encounter: Payer: Self-pay | Admitting: Obstetrics & Gynecology

## 2021-12-24 ENCOUNTER — Other Ambulatory Visit: Payer: Self-pay | Admitting: Obstetrics & Gynecology

## 2021-12-24 ENCOUNTER — Telehealth: Payer: Self-pay

## 2021-12-24 ENCOUNTER — Ambulatory Visit (INDEPENDENT_AMBULATORY_CARE_PROVIDER_SITE_OTHER): Payer: Medicare Other

## 2021-12-24 ENCOUNTER — Ambulatory Visit (INDEPENDENT_AMBULATORY_CARE_PROVIDER_SITE_OTHER): Payer: Medicare Other | Admitting: Obstetrics & Gynecology

## 2021-12-24 ENCOUNTER — Encounter: Payer: Self-pay | Admitting: Obstetrics & Gynecology

## 2021-12-24 VITALS — BP 120/78 | HR 70 | Resp 16

## 2021-12-24 DIAGNOSIS — R9389 Abnormal findings on diagnostic imaging of other specified body structures: Secondary | ICD-10-CM | POA: Diagnosis not present

## 2021-12-24 DIAGNOSIS — R188 Other ascites: Secondary | ICD-10-CM

## 2021-12-24 DIAGNOSIS — R1900 Intra-abdominal and pelvic swelling, mass and lump, unspecified site: Secondary | ICD-10-CM

## 2021-12-24 DIAGNOSIS — D252 Subserosal leiomyoma of uterus: Secondary | ICD-10-CM | POA: Diagnosis not present

## 2021-12-24 DIAGNOSIS — N83201 Unspecified ovarian cyst, right side: Secondary | ICD-10-CM

## 2021-12-24 DIAGNOSIS — D219 Benign neoplasm of connective and other soft tissue, unspecified: Secondary | ICD-10-CM

## 2021-12-24 DIAGNOSIS — D399 Neoplasm of uncertain behavior of female genital organ, unspecified: Secondary | ICD-10-CM

## 2021-12-24 DIAGNOSIS — N840 Polyp of corpus uteri: Secondary | ICD-10-CM | POA: Diagnosis not present

## 2021-12-24 NOTE — Telephone Encounter (Signed)
Refer back to [name], MD  [facility] Triage 76 yo with Rt Ovary showing multiple small cysts. 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  Rt Ovary previously not seen possibly hidden by the large SS Fibroid on the Rt, which is stable at 12 x 10 cm.  The endometrial line is thickened again with probable polyps.  [facility] with benign patho in 2022.  Refer to Gyn-Onco with Dr Berline Lopes for opinion and management of the Rt ovarian cysts/FF in the pelvis/Thickened endometrium/Large Fibroid.  Pending Ca 125.

## 2021-12-24 NOTE — Telephone Encounter (Signed)
Referral placed in Epic.

## 2021-12-24 NOTE — Progress Notes (Signed)
    ANALY BASSFORD 02/11/1946 [id number]        76 y.o.  G0  RP: Thickened endometrium for Sonohysto  HPI: H/O Simple Endometrial Hyperplasia.  Endometrial Polyp removed by [facility] in 2022, no PMB since then.  No pelvic pain.  Referred to Dr Berline Lopes and seen on 09/08/2020. On Pelvic US 11/05/21 large posterior right lateral fibroid measured at 11.8 x 10.2 cm.  Stable in size x 09/2020.  Rt ovary not visualized. Small volume of FF.    OB History  Gravida Para Term Preterm AB Living  0            SAB IAB Ectopic Multiple Live Births               Past medical history,surgical history, problem list, medications, allergies, family history and social history were all reviewed and documented in the EPIC chart.   Directed ROS with pertinent positives and negatives documented in the history of present illness/assessment and plan.  Exam:  Vitals:   12/24/21 1129  BP: 120/78  Pulse: 70  Resp: 16   General appearance:  Normal  Pelvic US today: Comparison is made with previous scan in 2022 and in August 2023.  Enlarged uterus with large fibroid to the right side measured at 12 x 10 cm.  Review of images shows no significant change in fibroids since 2022.  Thickened endometrial lining measured at 6.2 mm.  At least 1 feeder vessel is identified to the area of thickening.  Possible endometrial polyp.  Left ovary is small with atrophic appearance.  No change since last pelvic ultrasounds.  Avascular multicystic mass in the right adnexa is seen today measured at 4 x 2.4 x 2.3 cm.  This mass was not identified on previous scan or on the previous MRI.  A pocket of free fluid is noted in the right adnexa measured at 6 x 4.8 cm.   Assessment/Plan:  76 y.o. G0P0   1. Thickened endometrium H/O Simple Endometrial Hyperplasia.  Endometrial Polyp removed by [facility] in 2022, no PMB since then. No pelvic pain.  On Pelvic US 11/05/21 large posterior right lateral fibroid measured at 11.8 x 10.2 cm.  Stable in size x  09/2020.  Rt ovary not visualized. Small volume of FF.  The endometrial lining is still thickened, measured at 6.2 mm with a feeder vessel, possible endometrial polyp.  Given the right adnexal mass, the patient will be referred to chronic oncology.  2. Endometrial polyp As above.  3. Subserous leiomyoma of uterus Large right subserosal fibroid stable in size at 12 x 10 cm.  4. Right ovarian cyst Right adnexal avascular multicystic mass measured at 4 x 2.4 x 2.3 cm.  A pocket of free fluid is noted in the right adnexa measured at 6 x 4.8 cm.  This is a new finding not seen on the previous pelvic ultrasound in 2022 and in August 2023, as well as on the MRI in March 2022.  Although mild FF was present on those studies.  Pending Ca 125.  Refer back to Dr. Berline Lopes in gynecologic oncology for opinion and management. 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

## 2021-12-25 ENCOUNTER — Encounter: Payer: Self-pay | Admitting: Obstetrics & Gynecology

## 2021-12-25 LAB — CA 125: CA 125: 26 U/mL (ref <35)

## 2021-12-27 ENCOUNTER — Encounter: Payer: Self-pay | Admitting: Obstetrics & Gynecology

## 2021-12-28 ENCOUNTER — Telehealth: Payer: Self-pay | Admitting: *Deleted

## 2021-12-28 NOTE — Telephone Encounter (Signed)
Spoke with the patient regarding the referral to GYN oncology. Patient scheduled as new patient with Dr Berline Lopes on 10/27 at 10:30 am. Patient given an arrival time of 10 am.  Explained to the patient the the doctor will perform a pelvic exam at this visit. Patient given the policy that no visitors under the 16 yrs are allowed in the [facility]. Patient given the address/phone number for the clinic and that the center offers free valet service.

## 2021-12-29 NOTE — Telephone Encounter (Signed)
Patient is scheduled with Dr. Berline Lopes on 01/08/22 at 10:30am.

## 2022-01-07 ENCOUNTER — Encounter: Payer: Self-pay | Admitting: Gynecologic Oncology

## 2022-01-08 ENCOUNTER — Encounter: Payer: Self-pay | Admitting: Gynecologic Oncology

## 2022-01-08 ENCOUNTER — Inpatient Hospital Stay (HOSPITAL_BASED_OUTPATIENT_CLINIC_OR_DEPARTMENT_OTHER): Payer: Medicare Other | Admitting: Gynecologic Oncology

## 2022-01-08 ENCOUNTER — Inpatient Hospital Stay: Payer: Medicare Other | Attending: Gynecologic Oncology | Admitting: Gynecologic Oncology

## 2022-01-08 VITALS — BP 153/67 | HR 91 | Temp 98.3°F | Resp 16 | Ht 63.0 in | Wt 101.0 lb

## 2022-01-08 DIAGNOSIS — D3910 Neoplasm of uncertain behavior of unspecified ovary: Secondary | ICD-10-CM

## 2022-01-08 DIAGNOSIS — R9389 Abnormal findings on diagnostic imaging of other specified body structures: Secondary | ICD-10-CM | POA: Diagnosis present

## 2022-01-08 DIAGNOSIS — I341 Nonrheumatic mitral (valve) prolapse: Secondary | ICD-10-CM

## 2022-01-08 DIAGNOSIS — D259 Leiomyoma of uterus, unspecified: Secondary | ICD-10-CM | POA: Diagnosis not present

## 2022-01-08 DIAGNOSIS — N83201 Unspecified ovarian cyst, right side: Secondary | ICD-10-CM

## 2022-01-08 MED ORDER — TRAMADOL HCL 50 MG PO TABS: 50.0000 mg | ORAL_TABLET | Freq: Four times a day (QID) | ORAL | 0 refills | Status: DC | PRN

## 2022-01-08 MED ORDER — SENNOSIDES-DOCUSATE SODIUM 8.6-50 MG PO TABS: 2.0000 | ORAL_TABLET | Freq: Every day | ORAL | 0 refills | Status: DC

## 2022-01-08 NOTE — Progress Notes (Signed)
Gynecologic Oncology Return Clinic Visit  01/08/22  Reason for Visit: follow-up in the setting of pelvic mass (uterine fibroid vs ovarian mass), new cystic adnexal lesion  Treatment History: I met the patient initially in 06/2020. She was noted to have a pelvic mass on routine exam prior to her visit with me.  Pelvic ultrasound revealed an enlarged uterus measuring 7.4 x 9.8 with pelvic mass associated with the lower uterine segment inferiorly into the right, measuring 9.9 x 11.6 cm, deviating the uterus to the left.  Endometrium approximately 4 mm but difficult to visualize secondary to the fibroid.  Normal bilateral ovaries.  MRI was then subsequently done on 05/29/2020 showing a large mass in the right pelvis, not clearly arising from the uterus but displacing it to the left.  Constellation of findings would favor an adnexal mass with little if any enhancement.  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  Fibrous more of the ovary and other low-grade ovarian neoplasms are also considered.  Thickening of the endometrium measuring 5 mm noted.  Small volume pelvic ascites noted.  Additionally, she had tumor markers with normal CA-125, 19 9, and a very mildly elevated CEA.  Given CEA, colonoscopy referral was suggested.  Patient underwent colonoscopy in March of this year, which was normal.  Pelvic ultrasound was repeated in June 2022 showing a similar size uterus with again a right pedunculated mass, more consistent with a fibroid.  Endometrium measuring 6 mm, cannot rule out a polyp.  I had a phone visit with the patient at this time.  Given what was overall felt to be a benign process, discussed follow-up imaging at the end of the year and continued work-up with her GYN for thickened endometrium.  11/18/20: Patient underwent hysteroscopy with resection of polypoid lesions.  This showed endometrium with focal  simple hyperplasia without atypia.  Multiple fragments of endometrial polyp with inactive glands.  No atypia or malignancy.  Patient was started on Provera.  03/26/21: Office pelvic ultrasound performed showing lateral fibroid again, measuring 10 x 9.5 cm.  This is slightly decreased in size to last ultrasound.  Endometrial lining measures 3.4 mm.  Right ovary not seen, left ovary atrophic.  Small amount of free fluid.   11/05/21: Office pelvic ultrasound reveals lateral fibroid measuring 11.8 x 10.2 cm.  Asymmetrically thickened heterogenous endometrium measures 21.6 mm and is minimally vascular.  Left ovary is atrophic with small avascular calcifications.  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  Endometrial biopsy performed on 11/18/20 showing fragments of inactive endometrium, no malignancy.  12/24/2021: Office pelvic ultrasound shows no significant change noted in the fibroid.  Endometrial lining measures 6.2 mm.  At least 1 feeder vessel identified to the area of thickening, possible polyp.  No change in avascular multicystic mass in the right adnexa measuring 4 x 2.4 x 2.3 cm.  Pocket of free fluid noted measuring 6 x 4.8 cm.  CA-125 on 10/12 was 26 (had been 27 in 2022).  Her medical history is notable for mitral valve prolapse.  She was seen last year for consideration of valve repair; recommendation at that time was to continue with close surveillance. She sees Dr. Gwenlyn Found yearly.  Interval History: The patient reports overall doing well.  She denies any significant symptoms since I saw her last including pelvic pain, change to bowel function, or new bladder symptoms.  She denies any  vaginal bleeding, including after her more recent endometrial biopsy.  She denies any cardiac symptoms including chest pain, palpitations, or shortness of breath.  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    cardiologist--- dr berry---  long hx of known mvp, requires antibiotic prophylaxis;  last echo in epic 05-21-2020 ef 60-65%, G2DD, bileaflet MV prolapse w/ severe MR, mild-mod TR, severe LAE;  per cardiology note pt is asymptomatic walks several miles daily   Osteoporosis    "RIGHT HIP BONE DENSITY LOSS AS OF 05/27/21",NOT ON MEDICATIONS"   Primary hyperparathyroidism [facility])    endocrinologist--- dr Chalmers Cater   Wears contact lenses     Past Surgical History:  Procedure Laterality Date   BREAST BIOPSY Left 2018   Benign   BREAST SURGERY Left 1990   Benign breast cyst   COLONOSCOPY     last one 2012 approx   DILATATION & CURETTAGE/HYSTEROSCOPY WITH MYOSURE N/A 11/18/2020   Procedure: [place];  Surgeon: Princess Bruins, MD;  Location: [place];  Service: Gynecology;  Laterality: N/A;   PARATHYROIDECTOMY N/A 01/23/2021   Procedure: NECK EXPLORATION WITH PARATHYROIDECTOMY;  Surgeon: Armandina Gemma, MD;  Location: [facility];  Service: General;  Laterality: N/A;    Family History  Problem Relation Age of Onset   Alzheimer's disease Father    Aneurysm Brother    Breast cancer Maternal Aunt 57   ALS Maternal Grandmother    Parkinson's disease Maternal Grandfather    Breast cancer Paternal Grandmother 54   Colon cancer Neg Hx    Colon polyps Neg Hx    Esophageal cancer Neg Hx    Rectal cancer Neg Hx    Stomach cancer Neg Hx     Social History   Socioeconomic History   Marital status: Widowed    Spouse  name: Not on file   Number of children: Not on file   Years of education: Not on file   Highest education level: Not on file  Occupational History   Not on file  Tobacco Use   Smoking status: Never    Passive exposure: Past ("MOTHER DID")   Smokeless tobacco: Never  Vaping Use   Vaping Use: Never used  Substance and Sexual Activity   Alcohol use: Yes    Comment: socially   Drug use: Never   Sexual activity: Not Currently    Birth control/protection: Post-menopausal    Comment: 1st intercourse 76 yo-Fewer than 5 partners  Other Topics Concern   Not on file  Social History Narrative   Not on file   Social Determinants of Health   Financial Resource Strain: Not on file  Food Insecurity: Not on file  Transportation Needs: Not on file  Physical Activity: Not on file  Stress: Not on file  Social Connections: Not on file    Current Medications:  Current Outpatient Medications:    aspirin 81 MG tablet, Take 1 tablet (81 mg total) by mouth  daily., Disp: 90 tablet, Rfl: 3   aspirin-acetaminophen-caffeine (EXCEDRIN MIGRAINE) 250-250-65 MG tablet, Take 1 tablet by mouth every 6 (six) hours as needed for headache., Disp: , Rfl:    cetirizine (ZYRTEC) 10 MG tablet, Take 10 mg by mouth daily as needed for allergies., Disp: , Rfl:    famotidine (PEPCID) 10 MG tablet, Take 10 mg by mouth daily as needed for heartburn or indigestion., Disp: , Rfl:    fluticasone (FLONASE) 50 MCG/ACT nasal spray, Place 1 spray into both nostrils daily as needed for allergies or rhinitis., Disp: , Rfl:    irbesartan (AVAPRO) 150 MG tablet, TAKE 1 TABLET(150 MG) BY MOUTH DAILY, Disp: 90 tablet, Rfl: 3   levocetirizine (XYZAL) 5 MG tablet, Take 5 mg by mouth at bedtime., Disp: , Rfl:    levothyroxine (SYNTHROID) 88 MCG tablet, Take 88 mcg by mouth daily before breakfast. Take Monday thru friday, Disp: , Rfl:    senna-docusate (SENOKOT-S) 8.6-50 MG tablet, Take 2 tablets by mouth at bedtime. For AFTER surgery,  do not take if having diarrhea, Disp: 30 tablet, Rfl: 0   SYNTHROID 100 MCG tablet, Take 100 mcg by mouth. Take Saturday & sunday, Disp: , Rfl:    traMADol (ULTRAM) 50 MG tablet, Take 1 tablet (50 mg total) by mouth every 6 (six) hours as needed for severe pain. For AFTER surgery only, do not take and drive, Disp: 10 tablet, Rfl: 0  Current Facility-Administered Medications:    0.9 %  sodium chloride infusion, 500 mL, Intravenous, Once, Daryel November, MD  Review of Systems: Denies appetite changes, fevers, chills, fatigue, unexplained weight changes. Denies hearing loss, neck lumps or masses, mouth sores, ringing in ears or voice changes. Denies cough or wheezing.  Denies shortness of breath. Denies chest pain or palpitations. Denies leg swelling. Denies abdominal distention, pain, blood in stools, constipation, diarrhea, nausea, vomiting, or early satiety. Denies pain with intercourse, dysuria, frequency, hematuria or incontinence. Denies hot flashes, pelvic pain, vaginal bleeding or vaginal discharge.   Denies joint pain, back pain or muscle pain/cramps. Denies itching, rash, or wounds. Denies dizziness, headaches, numbness or seizures. Denies swollen lymph nodes or glands, denies easy bruising or bleeding. Denies anxiety, depression, confusion, or decreased concentration.  Physical Exam: BP (!) 153/67 (BP Location: Left Arm, Patient Position: Sitting) Comment: Dr. Berline Lopes notified  Pulse 91   Temp 98.3 F (36.8 C) (Oral)   Resp 16   Ht _0  (1.6 m)   Wt 101 lb (45.8 kg)   SpO2 100%   BMI 17.89 kg/m  General: Alert, oriented, no acute distress.  HEENT: Normocephalic, atraumatic. Sclera anicteric.  Chest: Clear to auscultation bilaterally. No wheezes, rhonchi, or rales. Cardiovascular: Regular rate and rhythm, holosystolic murmur, no rubs. Abdomen: Normoactive bowel sounds. Soft, nondistended, nontender to palpation.  Mass appreciated in the lower pelvis almost up to the  umbilicus, mobile from side to side.   Extremities: Grossly normal range of motion. Warm, well perfused. No edema bilaterally.  Skin: No rashes or lesions.  Lymphatics: No cervical, supraclavicular, or inguinal adenopathy.  GU: Normal external female genitalia.  On bimanual exam, uterus is small and deviated to the left.  There is a smooth mass filling much of the pelvis most up to the umbilicus.  The mass is mobile in conjunction with the uterus, moves freely from the sidewalls.  No nodularity appreciated.    Laboratory & Radiologic Studies: Se above treatment history for more recent imaging  Assessment & Plan: Marcie Bal  Brenda Cobb is a 76 y.o. woman with asymptomatic solid-appearing pelvic mass, likely either uterine fibroid or solid ovarian tumor, as well as a new, avascular mass most consistent with adnexal cyst or hydrosalpinx.  We discussed management options including MRI versus proceeding with diagnostic surgery.  The patient voices significant anxiety related to multiple scans and procedures since I have seen her.  She is wanting to move forward with surgery.  My office will reach out to Dr. Gwenlyn Found regarding preoperative clearance. I will send him an Epic message about any additional testing that would need to be done prior to surgery (eg updating her ECHO).  Discussed plan for removal of the uterus and bilateral adnexa.  Given size of the solid-appearing mass, this will require at least a mini laparotomy.  My plan would be to place the mass in an Endo Catch bag and remove it through a small incision using contained morcellation.  Would plan for frozen section during surgery.  If benign, no additional procedures would be indicated.  If mass is ovarian and a borderline tumor noted, we discussed additional procedures including peritoneal biopsies and omentectomy.  In the setting of malignancy (whether uterine or ovarian), we discussed addition of lymph node assessment.  We reviewed the plan for a  robotic assisted hysterectomy, bilateral salpingo-oophorectomy, possible staging, mini-laparotomy. The risks of surgery were discussed in detail and she understands these to include infection; wound separation; hernia; vaginal cuff separation, injury to adjacent organs such as bowel, bladder, blood vessels, ureters and nerves; bleeding which may require blood transfusion; anesthesia risk; thromboembolic events; possible death; unforeseen complications; possible need for re-exploration; medical complications such as heart attack, stroke, pleural effusion and pneumonia; and, if full lymphadenectomy is performed the risk of lymphedema and lymphocyst. The patient will receive DVT and antibiotic prophylaxis as indicated. She voiced a clear understanding. She had the opportunity to ask questions. Perioperative instructions were reviewed with her. Prescriptions for post-op medications were sent to her pharmacy of choice.  50 minutes of total time was spent for this patient encounter, including preparation, face-to-face counseling with the patient and coordination of care, and documentation of the encounter.  Jeral Pinch, MD  Division of Gynecologic Oncology  Department of Obstetrics and Gynecology  [facility]

## 2022-01-08 NOTE — Patient Instructions (Signed)
Preparing for your Surgery  Plan for surgery on February 18, 2022 with Dr. Jeral Pinch at [place] will be scheduled for robotic assisted total laparoscopic hysterectomy (removal of the uterus and cervix), bilateral salpingo-oophorectomy (removal of both ovaries and fallopian tubes), possible staging if a cancer is identified, mini laparotomy (for removal of surgical specimens).  Pre-operative Testing -You will receive a phone call from presurgical testing at [facility] to arrange for a pre-operative appointment and lab work.  -Bring your insurance card, copy of an advanced directive if applicable, medication list  -At that visit, you will be asked to sign a consent for a possible blood transfusion in case a transfusion becomes necessary during surgery.  The need for a blood transfusion is rare but having consent is a necessary part of your care.     -You should not be taking blood thinners or aspirin at least ten days prior to surgery unless instructed by your surgeon.  -Do not take supplements such as fish oil (omega 3), red yeast rice, turmeric before your surgery. You want to avoid medications with aspirin in them including headache powders such as BC or Goody's), Excedrin migraine.  Day Before Surgery at [place] will be asked to take in a light diet the day before surgery. You will be advised you can have clear liquids up until 3 hours before your surgery.    Eat a light diet the day before surgery.  Examples including soups, broths, toast, yogurt, mashed potatoes.  AVOID GAS PRODUCING FOODS. Things to avoid include carbonated beverages (fizzy beverages, sodas), raw fruits and raw vegetables (uncooked), or beans.   If your bowels are filled with gas, your surgeon will have difficulty visualizing your pelvic organs which increases your surgical risks.  Your role in recovery Your role is to become active as soon as directed by your doctor, while still giving  yourself time to heal.  Rest when you feel tired. You will be asked to do the following in order to speed your recovery:  - Cough and breathe deeply. This helps to clear and expand your lungs and can prevent pneumonia after surgery.  - [place]. Do mild physical activity. Walking or moving your legs help your circulation and body functions return to normal. Do not try to get up or walk alone the first time after surgery.   -If you develop swelling on one leg or the other, pain in the back of your leg, redness/warmth in one of your legs, please call the office or go to the Emergency Room to have a doppler to rule out a blood clot. For shortness of breath, chest pain-seek care in the Emergency Room as soon as possible. - Actively manage your pain. Managing your pain lets you move in comfort. We will ask you to rate your pain on a scale of zero to 10. It is your responsibility to tell your doctor or nurse where and how much you hurt so your pain can be treated.  Special Considerations -If you are diabetic, you may be placed on insulin after surgery to have closer control over your blood sugars to promote healing and recovery.  This does not mean that you will be discharged on insulin.  If applicable, your oral antidiabetics will be resumed when you are tolerating a solid diet.  -Your final pathology results from surgery should be available around one week after surgery and the results will be relayed to you  when available.  -FMLA forms can be faxed to [phone] and please allow 5-7 business days for completion.  Pain Management After Surgery -You will be prescribed your pain medication and bowel regimen medications before surgery so that you can have these available when you are discharged from the hospital. The pain medication is for use ONLY AFTER surgery and a new prescription will not be given.   -Make sure that you have Tylenol and Ibuprofen IF YOU ARE ABLE TO TAKE THESE  MEDICATIONS at home to use on a regular basis after surgery for pain control. We recommend alternating the medications every hour to six hours since they work differently and are processed in the body differently for pain relief.  -Review the attached handout on narcotic use and their risks and side effects.   Bowel Regimen -You will be prescribed Sennakot-S to take nightly to prevent constipation especially if you are taking the narcotic pain medication intermittently.  It is important to prevent constipation and drink adequate amounts of liquids. You can stop taking this medication when you are not taking pain medication and you are back on your normal bowel routine.  Risks of Surgery Risks of surgery are low but include bleeding, infection, damage to surrounding structures, re-operation, blood clots, and very rarely death.   Blood Transfusion Information (For the consent to be signed before surgery)  We will be checking your blood type before surgery so in case of emergencies, we will know what type of blood you would need.                                            WHAT IS A BLOOD TRANSFUSION?  A transfusion is the replacement of blood or some of its parts. Blood is made up of multiple cells which provide different functions. Red blood cells carry oxygen and are used for blood loss replacement. White blood cells fight against infection. Platelets control bleeding. Plasma helps clot blood. Other blood products are available for specialized needs, such as hemophilia or other clotting disorders. BEFORE THE TRANSFUSION  Who gives blood for transfusions?  You may be able to donate blood to be used at a later date on yourself (autologous donation). Relatives can be asked to donate blood. This is generally not any safer than if you have received blood from a stranger. The same precautions are taken to ensure safety when a relative's blood is donated. Healthy volunteers who are fully evaluated  to make sure their blood is safe. This is blood bank blood. Transfusion therapy is the safest it has ever been in the practice of medicine. Before blood is taken from a donor, a complete history is taken to make sure that person has no history of diseases nor engages in risky social behavior (examples are intravenous drug use or sexual activity with multiple partners). The donor's travel history is screened to minimize risk of transmitting infections, such as malaria. The donated blood is tested for signs of infectious diseases, such as HIV and hepatitis. The blood is then tested to be sure it is compatible with you in order to minimize the chance of a transfusion reaction. If you or a relative donates blood, this is often done in anticipation of surgery and is not appropriate for emergency situations. It takes many days to process the donated blood. RISKS AND COMPLICATIONS Although transfusion therapy is very safe  and saves many lives, the main dangers of transfusion include:  Getting an infectious disease. Developing a transfusion reaction. This is an allergic reaction to something in the blood you were given. Every precaution is taken to prevent this. The decision to have a blood transfusion has been considered carefully by your caregiver before blood is given. Blood is not given unless the benefits outweigh the risks.  AFTER SURGERY INSTRUCTIONS  Return to work: 4-6 weeks if applicable  Activity: 1. Be up and out of the bed during the day.  Take a nap if needed.  You may walk up steps but be careful and use the hand rail.  Stair climbing will tire you more than you think, you may need to stop part way and rest.   2. No lifting or straining for 6 weeks over 10 pounds. No pushing, pulling, straining for 6 weeks.  3. No driving for around 1 week(s).  Do not drive if you are taking narcotic pain medicine and make sure that your reaction time has returned.   4. You can shower as soon as the next  day after surgery. Shower daily.  Use your regular soap and water (not directly on the incision) and pat your incision(s) dry afterwards; don't rub.  No tub baths or submerging your body in water until cleared by your surgeon. If you have the soap that was given to you by pre-surgical testing that was used before surgery, you do not need to use it afterwards because this can irritate your incisions.   5. No sexual activity and nothing in the vagina for 8-10 weeks.  6. You may experience a small amount of clear drainage from your incisions, which is normal.  If the drainage persists, increases, or changes color please call the office.  7. Do not use creams, lotions, or ointments such as neosporin on your incisions after surgery until advised by your surgeon because they can cause removal of the dermabond glue on your incisions.    8. You may experience vaginal spotting after surgery or around the 6-8 week mark from surgery when the stitches at the top of the vagina begin to dissolve.  The spotting is normal but if you experience heavy bleeding, call our office.  9. Take Tylenol or ibuprofen first for pain if you are able to take these medications and only use narcotic pain medication for severe pain not relieved by the Tylenol or Ibuprofen.  Monitor your Tylenol intake to a max of 4,000 mg in a 24 hour period. You can alternate these medications after surgery.  Diet: 1. Low sodium Heart Healthy Diet is recommended but you are cleared to resume your normal (before surgery) diet after your procedure.  2. It is safe to use a laxative, such as Miralax or Colace, if you have difficulty moving your bowels. You have been prescribed Sennakot-S to take at bedtime every evening after surgery to keep bowel movements regular and to prevent constipation.    Wound Care: 1. Keep clean and dry.  Shower daily.  Reasons to call the Doctor: Fever - Oral temperature greater than 100.4 degrees  Fahrenheit Foul-smelling vaginal discharge Difficulty urinating Nausea and vomiting Increased pain at the site of the incision that is unrelieved with pain medicine. Difficulty breathing with or without chest pain New calf pain especially if only on one side Sudden, continuing increased vaginal bleeding with or without clots.   Contacts: For questions or concerns you should contact:  Dr. Jeral Pinch at  [place], NP at [phone]  After Hours: call [phone] and have the GYN Oncologist paged/contacted (after 5 pm or on the weekends).  Messages sent via mychart are for non-urgent matters and are not responded to after hours so for urgent needs, please call the after hours number.

## 2022-01-09 NOTE — Progress Notes (Signed)
Patient here for consultation with Dr. Jeral Pinch and for a pre-operative appointment prior to her scheduled surgery on February 18, 2022. She is scheduled for robotic assisted total laparoscopic hysterectomy, bilateral salpingo-oophorectomy, possible staging if a cancer is identified, mini laparotomy. The surgery was discussed in detail.  See after visit summary for additional details. Visual aids used to discuss items related to surgery including sequential compression stockings, foley catheter, IV pump, multi-modal pain regimen including tylenol, photo of the surgical robot, female reproductive system to discuss surgery in detail.      Discussed post-op pain management in detail including the aspects of the enhanced recovery pathway.  Advised her that a new prescription would be sent in for tramadol and it is only to be used for after her upcoming surgery.  We discussed the use of tylenol post-op and to monitor for a maximum of 4,000 mg in a 24 hour period.  Also prescribed sennakot to be used after surgery and to hold if having loose stools.  Discussed bowel regimen in detail.     Discussed the use of SCDs and measures to take at home to prevent DVT including frequent mobility.  Reportable signs and symptoms of DVT discussed. Post-operative instructions discussed and expectations for after surgery. Incisional care discussed as well including reportable signs and symptoms including erythema, drainage, wound separation.     5 minutes spent with the patient.  Verbalizing understanding of material discussed. No needs or concerns voiced at the end of the visit.   Advised patient to call for any needs.  Advised that her post-operative medications had been prescribed and could be picked up at any time.    This appointment is included in the global surgical bundle as pre-operative teaching and has no charge.

## 2022-01-09 NOTE — Patient Instructions (Signed)
Preparing for your Surgery   Plan for surgery on February 18, 2022 with Dr. Jeral Pinch at [facility] will be scheduled for robotic assisted total laparoscopic hysterectomy (removal of the uterus and cervix), bilateral salpingo-oophorectomy (removal of both ovaries and fallopian tubes), possible staging if a cancer is identified, mini laparotomy (for removal of surgical specimens).   Pre-operative Testing -You will receive a phone call from presurgical testing at [facility] to arrange for a pre-operative appointment and lab work.   -Bring your insurance card, copy of an advanced directive if applicable, medication list   -At that visit, you will be asked to sign a consent for a possible blood transfusion in case a transfusion becomes necessary during surgery.  The need for a blood transfusion is rare but having consent is a necessary part of your care.      -You should not be taking blood thinners or aspirin at least ten days prior to surgery unless instructed by your surgeon.   -Do not take supplements such as fish oil (omega 3), red yeast rice, turmeric before your surgery. You want to avoid medications with aspirin in them including headache powders such as BC or Goody's), Excedrin migraine.   Day Before Surgery at [place] will be asked to take in a light diet the day before surgery. You will be advised you can have clear liquids up until 3 hours before your surgery.     Eat a light diet the day before surgery.  Examples including soups, broths, toast, yogurt, mashed potatoes.  AVOID GAS PRODUCING FOODS. Things to avoid include carbonated beverages (fizzy beverages, sodas), raw fruits and raw vegetables (uncooked), or beans.    If your bowels are filled with gas, your surgeon will have difficulty visualizing your pelvic organs which increases your surgical risks.   Your role in recovery Your role is to become active as soon as directed by your doctor, while  still giving yourself time to heal.  Rest when you feel tired. You will be asked to do the following in order to speed your recovery:   - Cough and breathe deeply. This helps to clear and expand your lungs and can prevent pneumonia after surgery.  - [place]. Do mild physical activity. Walking or moving your legs help your circulation and body functions return to normal. Do not try to get up or walk alone the first time after surgery.   -If you develop swelling on one leg or the other, pain in the back of your leg, redness/warmth in one of your legs, please call the office or go to the Emergency Room to have a doppler to rule out a blood clot. For shortness of breath, chest pain-seek care in the Emergency Room as soon as possible. - Actively manage your pain. Managing your pain lets you move in comfort. We will ask you to rate your pain on a scale of zero to 10. It is your responsibility to tell your doctor or nurse where and how much you hurt so your pain can be treated.   Special Considerations -If you are diabetic, you may be placed on insulin after surgery to have closer control over your blood sugars to promote healing and recovery.  This does not mean that you will be discharged on insulin.  If applicable, your oral antidiabetics will be resumed when you are tolerating a solid diet.   -Your final pathology results from surgery should be available  around one week after surgery and the results will be relayed to you when available.   -FMLA forms can be faxed to [phone] and please allow 5-7 business days for completion.   Pain Management After Surgery -You will be prescribed your pain medication and bowel regimen medications before surgery so that you can have these available when you are discharged from the hospital. The pain medication is for use ONLY AFTER surgery and a new prescription will not be given.    -Make sure that you have Tylenol and Ibuprofen IF YOU ARE  ABLE TO TAKE THESE MEDICATIONS at home to use on a regular basis after surgery for pain control. We recommend alternating the medications every hour to six hours since they work differently and are processed in the body differently for pain relief.   -Review the attached handout on narcotic use and their risks and side effects.    Bowel Regimen -You will be prescribed Sennakot-S to take nightly to prevent constipation especially if you are taking the narcotic pain medication intermittently.  It is important to prevent constipation and drink adequate amounts of liquids. You can stop taking this medication when you are not taking pain medication and you are back on your normal bowel routine.   Risks of Surgery Risks of surgery are low but include bleeding, infection, damage to surrounding structures, re-operation, blood clots, and very rarely death.     Blood Transfusion Information (For the consent to be signed before surgery)   We will be checking your blood type before surgery so in case of emergencies, we will know what type of blood you would need.                                             WHAT IS A BLOOD TRANSFUSION?   A transfusion is the replacement of blood or some of its parts. Blood is made up of multiple cells which provide different functions. Red blood cells carry oxygen and are used for blood loss replacement. White blood cells fight against infection. Platelets control bleeding. Plasma helps clot blood. Other blood products are available for specialized needs, such as hemophilia or other clotting disorders. BEFORE THE TRANSFUSION  Who gives blood for transfusions?  You may be able to donate blood to be used at a later date on yourself (autologous donation). Relatives can be asked to donate blood. This is generally not any safer than if you have received blood from a stranger. The same precautions are taken to ensure safety when a relative's blood is donated. Healthy  volunteers who are fully evaluated to make sure their blood is safe. This is blood bank blood. Transfusion therapy is the safest it has ever been in the practice of medicine. Before blood is taken from a donor, a complete history is taken to make sure that person has no history of diseases nor engages in risky social behavior (examples are intravenous drug use or sexual activity with multiple partners). The donor's travel history is screened to minimize risk of transmitting infections, such as malaria. The donated blood is tested for signs of infectious diseases, such as HIV and hepatitis. The blood is then tested to be sure it is compatible with you in order to minimize the chance of a transfusion reaction. If you or a relative donates blood, this is often done in anticipation of surgery and  is not appropriate for emergency situations. It takes many days to process the donated blood. RISKS AND COMPLICATIONS Although transfusion therapy is very safe and saves many lives, the main dangers of transfusion include:  Getting an infectious disease. Developing a transfusion reaction. This is an allergic reaction to something in the blood you were given. Every precaution is taken to prevent this. The decision to have a blood transfusion has been considered carefully by your caregiver before blood is given. Blood is not given unless the benefits outweigh the risks.   AFTER SURGERY INSTRUCTIONS   Return to work: 4-6 weeks if applicable   Activity: 1. Be up and out of the bed during the day.  Take a nap if needed.  You may walk up steps but be careful and use the hand rail.  Stair climbing will tire you more than you think, you may need to stop part way and rest.    2. No lifting or straining for 6 weeks over 10 pounds. No pushing, pulling, straining for 6 weeks.   3. No driving for around 1 week(s).  Do not drive if you are taking narcotic pain medicine and make sure that your reaction time has returned.     4. You can shower as soon as the next day after surgery. Shower daily.  Use your regular soap and water (not directly on the incision) and pat your incision(s) dry afterwards; don't rub.  No tub baths or submerging your body in water until cleared by your surgeon. If you have the soap that was given to you by pre-surgical testing that was used before surgery, you do not need to use it afterwards because this can irritate your incisions.    5. No sexual activity and nothing in the vagina for 8-10 weeks.   6. You may experience a small amount of clear drainage from your incisions, which is normal.  If the drainage persists, increases, or changes color please call the office.   7. Do not use creams, lotions, or ointments such as neosporin on your incisions after surgery until advised by your surgeon because they can cause removal of the dermabond glue on your incisions.     8. You may experience vaginal spotting after surgery or around the 6-8 week mark from surgery when the stitches at the top of the vagina begin to dissolve.  The spotting is normal but if you experience heavy bleeding, call our office.   9. Take Tylenol or ibuprofen first for pain if you are able to take these medications and only use narcotic pain medication for severe pain not relieved by the Tylenol or Ibuprofen.  Monitor your Tylenol intake to a max of 4,000 mg in a 24 hour period. You can alternate these medications after surgery.   Diet: 1. Low sodium Heart Healthy Diet is recommended but you are cleared to resume your normal (before surgery) diet after your procedure.   2. It is safe to use a laxative, such as Miralax or Colace, if you have difficulty moving your bowels. You have been prescribed Sennakot-S to take at bedtime every evening after surgery to keep bowel movements regular and to prevent constipation.     Wound Care: 1. Keep clean and dry.  Shower daily.   Reasons to call the Doctor: Fever - Oral temperature  greater than 100.4 degrees Fahrenheit Foul-smelling vaginal discharge Difficulty urinating Nausea and vomiting Increased pain at the site of the incision that is unrelieved with pain medicine. Difficulty  breathing with or without chest pain New calf pain especially if only on one side Sudden, continuing increased vaginal bleeding with or without clots.   Contacts: For questions or concerns you should contact:   Dr. Jeral Pinch at [phone]   Joylene John, NP at [phone]   After Hours: call [phone] and have the GYN Oncologist paged/contacted (after 5 pm or on the weekends).   Messages sent via mychart are for non-urgent matters and are not responded to after hours so for urgent needs, please call the after hours number.

## 2022-01-11 ENCOUNTER — Telehealth: Payer: Self-pay

## 2022-01-11 NOTE — Telephone Encounter (Signed)
Per Joylene John NP, pt is aware of Dr. Gwenlyn Found giving medical clearance for robotic hysterectomy at low CV risk. Pt also aware, per Dr. Gwenlyn Found note, someone from his office will call pt and schedule a 2D echo for early to mid November.

## 2022-01-12 ENCOUNTER — Telehealth: Payer: Self-pay | Admitting: Cardiovascular Disease

## 2022-01-12 NOTE — Telephone Encounter (Signed)
Called patient back, advised I would send a message over to Dr.Berry to review and we can call her back- she would like to know if he would  need to see her before or after the surgery but after the ECHO.   If it is after she states it may be a little bit before she can come in to be seen. She just wanted to check with Dr.Berry.   If needed she can be seen on 12/05-12/06 if needed.

## 2022-01-12 NOTE — Telephone Encounter (Signed)
Patient stated she will be having surgery on 12/7 and is due for a f/u appointment with Dr. Gwenlyn Found.  Patient is scheduled for an Echo test on 12/1 and would like to know if Dr. Gwenlyn Found would like to see her prior to her surgery or after.

## 2022-01-12 NOTE — Telephone Encounter (Signed)
Spoke with pt regarding her surgery that is scheduled for 12/7. Per Dr. Gwenlyn Found would like to get echo done sooner than 12/1 therefore if follow up is needed time with allow. Pt verbalizes understanding.

## 2022-01-13 ENCOUNTER — Encounter: Payer: Medicare Other | Admitting: Gynecologic Oncology

## 2022-02-01 ENCOUNTER — Ambulatory Visit (HOSPITAL_COMMUNITY): Payer: Medicare Other | Attending: Cardiovascular Disease

## 2022-02-01 DIAGNOSIS — I340 Nonrheumatic mitral (valve) insufficiency: Secondary | ICD-10-CM | POA: Insufficient documentation

## 2022-02-01 DIAGNOSIS — I341 Nonrheumatic mitral (valve) prolapse: Secondary | ICD-10-CM | POA: Insufficient documentation

## 2022-02-01 LAB — ECHOCARDIOGRAM COMPLETE
Area-P 1/2: 1.81 cm2
MV M vel: 6.16 m/s
MV Peak grad: 151.8 mmHg
MV VTI: 1.39 cm2
P 1/2 time: 405 msec
S' Lateral: 2.6 cm

## 2022-02-01 MED ORDER — PERFLUTREN LIPID MICROSPHERE
1.0000 mL | INTRAVENOUS | Status: AC | PRN
  Administered 2022-02-01: 1 mL via INTRAVENOUS

## 2022-02-08 NOTE — Progress Notes (Signed)
COVID Vaccine received:  _0  No _1  Yes Date of any COVID positive Test in last 90 days:  PCP - Princess Bruins, MD Cardiologist - Quay Burow, MD Cardiac clearance- Telephone note 01-12-2022   Endocrinology- Jacelyn Pi, MD  Chest x-ray -  EKG - 02-24-2021 epic Stress Test -  ECHO - 02-01-2022  epic Cardiac Cath -   PCR screen: _2  Ordered & Completed                      _3   No Order but Needs PROFEND                      _4   N/A for this surgery  Surgery Plan:  _5  Ambulatory                            _6  Outpatient in bed                            _7  Admit  Anesthesia:    _8  General  _9  Spinal                           _10   Choice _11   MAC  Bowel Prep - _12  No  _13   Yes _GYN Diet preop___  Pacemaker / ICD device _14  No _15  Yes        Device order form faxed _16  No    _17   Yes      Faxed to:  History of Sleep Apnea? _18  No _19  Yes   CPAP used?- _20  No _21  Yes    Does the patient monitor blood sugar? _22  No _23  Yes  _24  N/A  Blood Thinner / Instructions: none Aspirin Instructions: ASA 81 mg  ERAS Protocol Ordered: _25  No  _26  Yes PRE-SURGERY _27  ENSURE  _28  G2  _29  No Drink Ordered  Patient is to be NPO after: 08:00 am  Comments: Patient lives in [place]: Can go up a flight of stairs and perform activities of daily living without stopping and without symptoms of chest pain or shortness of breath.   Anesthesia review: HTN  Patient denies shortness of breath, fever, cough and chest pain at PAT appointment.  Patient verbalized understanding and agreement to the Pre-Surgical Instructions that were given to them at this PAT appointment. Patient was also educated of the need to review these PAT instructions again prior to his/her surgery.I reviewed the appropriate phone numbers to call if they have any and questions or concerns.

## 2022-02-08 NOTE — Patient Instructions (Addendum)
SURGICAL WAITING ROOM VISITATION Patients having surgery or a procedure may have no more than 2 support people in the waiting area - these visitors may rotate in the visitor waiting room.   Children under the age of 52 must have an adult with them who is not the patient. If the patient needs to stay at the hospital during part of their recovery, the visitor guidelines for inpatient rooms apply.  PRE-OP VISITATION  Pre-op nurse will coordinate an appropriate time for 1 support person to accompany the patient in pre-op.  This support person may not rotate.  This visitor will be contacted when the time is appropriate for the visitor to come back in the pre-op area.  Please refer to the [facility] website for the visitor guidelines for Inpatients (after your surgery is over and you are in a regular room).  You are not required to quarantine at this time prior to your surgery. However, you must do this: Hand Hygiene often Do NOT share personal items Notify your provider if you are in close contact with someone who has COVID or you develop fever 100.4 or greater, new onset of sneezing, cough, sore throat, shortness of breath or body aches.  If you test positive for Covid or have been in contact with anyone that has tested positive in the last 10 days please notify you surgeon.    Your procedure is scheduled on:  Thursday February 18, 2022  Report to [facility] Main Entrance: [place] entrance where the [employer] is available.   Report to admitting at: 08:45  AM  +++++Call this number if you have any questions or problems the morning of surgery [phone]   FOLLOW BOWEL PREP AND ANY ADDITIONAL PRE OP INSTRUCTIONS YOU RECEIVED FROM YOUR SURGEON'S OFFICE!!! Eat a light diet the day before surgery.  Examples including soups, broths, toast, yogurt, mashed potatoes.  AVOID GAS PRODUCING FOODS. Things to avoid include carbonated beverages (fizzy beverages, sodas), raw fruits and raw  vegetables (uncooked), or beans.   Do not eat food after Midnight the night prior to your surgery/procedure.  After Midnight you may have the following liquids until   08:00  AM / PM DAY OF SURGERY  Clear Liquid Diet Water Black Coffee (sugar ok, NO MILK/CREAM OR CREAMERS)  Tea (sugar ok, NO MILK/CREAM OR CREAMERS) regular and decaf                             Plain Jell-O  with no fruit (NO RED)                                           Fruit ices (not with fruit pulp, NO RED)                                     Popsicles (NO RED)                                                                  Juice: apple, WHITE grape, WHITE cranberry  Sports drinks like Gatorade or Powerade (NO RED)     Oral Hygiene is also important to reduce your risk of infection.        Remember - BRUSH YOUR TEETH THE MORNING OF SURGERY WITH YOUR REGULAR TOOTHPASTE  Take ONLY these medicines the morning of surgery with A SIP OF WATER: Synthroid.  If needed, you may take Famotidine( Pepcid) and use your Flonase Nasal spray.                   You may not have any metal on your body including hair pins, jewelry, and body piercing  Do not wear make-up, lotions, powders, perfumes or deodorant  Do not wear nail polish including gel and S&S, artificial / acrylic nails, or any other type of covering on natural nails including finger and toenails. If you have artificial nails, gel coating, etc., that needs to be removed by a nail salon, Please have this removed prior to surgery. Not doing so may mean that your surgery could be cancelled or delayed if the Surgeon or anesthesia staff feels like they are unable to monitor you safely.   Do not shave 48 hours prior to surgery to avoid nicks in your skin which may contribute to postoperative infections.   Contacts, Hearing Aids, dentures or bridgework may not be worn into surgery.   Patients discharged on the day of surgery will not be allowed to drive home.  Someone NEEDS to  stay with you for the first 24 hours after anesthesia.  Do not bring your home medications to the hospital. The Pharmacy will dispense medications listed on your medication list to you during your admission in the Hospital.  Special Instructions: Bring a copy of your healthcare power of attorney and living will documents the day of surgery, if you wish to have them scanned into your [facility] Records- EPIC  Please read over the following fact sheets you were given: IF YOU HAVE QUESTIONS ABOUT YOUR PRE-OP INSTRUCTIONS, PLEASE CALL [phone]  ([employer])   [facility] - Preparing for Surgery Before surgery, you can play an important role.  Because skin is not sterile, your skin needs to be as free of germs as possible.  You can reduce the number of germs on your skin by washing with CHG (chlorahexidine gluconate) soap before surgery.  CHG is an antiseptic cleaner which kills germs and bonds with the skin to continue killing germs even after washing. Please DO NOT use if you have an allergy to CHG or antibacterial soaps.  If your skin becomes reddened/irritated stop using the CHG and inform your nurse when you arrive at Short Stay. Do not shave (including legs and underarms) for at least 48 hours prior to the first CHG shower.  You may shave your face/neck.  Please follow these instructions carefully:  1.  Shower with CHG Soap the night before surgery and the  morning of surgery.  2.  If you choose to wash your hair, wash your hair first as usual with your normal  shampoo.  3.  After you shampoo, rinse your hair and body thoroughly to remove the shampoo.                             4.  Use CHG as you would any other liquid soap.  You can apply chg directly to the skin and wash.  Gently with a scrungie or clean washcloth.  5.  Apply the CHG Soap to your body ONLY FROM THE NECK DOWN.   Do not use on face/ open                           Wound or open sores. Avoid contact with eyes, ears mouth and  genitals (private parts).                       Wash face,  Genitals (private parts) with your normal soap.             6.  Wash thoroughly, paying special attention to the area where your  surgery  will be performed.  7.  Thoroughly rinse your body with warm water from the neck down.  8.  DO NOT shower/wash with your normal soap after using and rinsing off the CHG Soap.            9.  Pat yourself dry with a clean towel.            10.  Wear clean pajamas.            11.  Place clean sheets on your bed the night of your first shower and do not  sleep with pets.  ON THE DAY OF SURGERY : Do not apply any lotions/deodorants the morning of surgery.  Please wear clean clothes to the hospital/surgery center.    FAILURE TO FOLLOW THESE INSTRUCTIONS MAY RESULT IN THE CANCELLATION OF YOUR SURGERY  PATIENT SIGNATURE_________________________________  NURSE SIGNATURE__________________________________  ________________________________________________________________________       WHAT IS A BLOOD TRANSFUSION? Blood Transfusion Information  A transfusion is the replacement of blood or some of its parts. Blood is made up of multiple cells which provide different functions. Red blood cells carry oxygen and are used for blood loss replacement. White blood cells fight against infection. Platelets control bleeding. Plasma helps clot blood. Other blood products are available for specialized needs, such as hemophilia or other clotting disorders. BEFORE THE TRANSFUSION  Who gives blood for transfusions?  Healthy volunteers who are fully evaluated to make sure their blood is safe. This is blood bank blood. Transfusion therapy is the safest it has ever been in the practice of medicine. Before blood is taken from a donor, a complete history is taken to make sure that person has no history of diseases nor engages in risky social behavior (examples are intravenous drug use or sexual activity with multiple  partners). The donor's travel history is screened to minimize risk of transmitting infections, such as malaria. The donated blood is tested for signs of infectious diseases, such as HIV and hepatitis. The blood is then tested to be sure it is compatible with you in order to minimize the chance of a transfusion reaction. If you or a relative donates blood, this is often done in anticipation of surgery and is not appropriate for emergency situations. It takes many days to process the donated blood. RISKS AND COMPLICATIONS Although transfusion therapy is very safe and saves many lives, the main dangers of transfusion include:  Getting an infectious disease. Developing a transfusion reaction. This is an allergic reaction to something in the blood you were given. Every precaution is taken to prevent this. The decision to have a blood transfusion has been considered carefully by your caregiver before blood is given. Blood is not given unless the benefits outweigh the risks. AFTER THE TRANSFUSION Right after receiving a blood  transfusion, you will usually feel much better and more energetic. This is especially true if your red blood cells have gotten low (anemic). The transfusion raises the level of the red blood cells which carry oxygen, and this usually causes an energy increase. The nurse administering the transfusion will monitor you carefully for complications. HOME CARE INSTRUCTIONS  No special instructions are needed after a transfusion. You may find your energy is better. Speak with your caregiver about any limitations on activity for underlying diseases you may have. SEEK MEDICAL CARE IF:  Your condition is not improving after your transfusion. You develop redness or irritation at the intravenous (IV) site. SEEK IMMEDIATE MEDICAL CARE IF:  Any of the following symptoms occur over the next 12 hours: Shaking chills. You have a temperature by mouth above 102 F (38.9 C), not controlled by  medicine. Chest, back, or muscle pain. People around you feel you are not acting correctly or are confused. Shortness of breath or difficulty breathing. Dizziness and fainting. You get a rash or develop hives. You have a decrease in urine output. Your urine turns a dark color or changes to pink, red, or brown. Any of the following symptoms occur over the next 10 days: You have a temperature by mouth above 102 F (38.9 C), not controlled by medicine. Shortness of breath. Weakness after normal activity. The white part of the eye turns yellow (jaundice). You have a decrease in the amount of urine or are urinating less often. Your urine turns a dark color or changes to pink, red, or brown. Document Released: 02/27/2000 Document Revised: 05/24/2011 Document Reviewed: 10/16/2007 [facility] Patient Information 2014 [place], [place].  _______________________________________________________________________

## 2022-02-09 ENCOUNTER — Other Ambulatory Visit: Payer: Self-pay

## 2022-02-09 DIAGNOSIS — I341 Nonrheumatic mitral (valve) prolapse: Secondary | ICD-10-CM

## 2022-02-09 DIAGNOSIS — I340 Nonrheumatic mitral (valve) insufficiency: Secondary | ICD-10-CM

## 2022-02-09 NOTE — Telephone Encounter (Signed)
Spoke with pt regarding echo results. See result note. Per Dr. Gwenlyn Found pt has been cleared at low risk from cardiac standpoint for robotic assisted hysterectomy. Pt verbalizes understanding.

## 2022-02-10 ENCOUNTER — Other Ambulatory Visit: Payer: Self-pay

## 2022-02-10 ENCOUNTER — Encounter (HOSPITAL_COMMUNITY)
Admission: RE | Admit: 2022-02-10 | Discharge: 2022-02-10 | Disposition: A | Discharge status: Home / Self Care | Payer: Medicare Other | Source: Ambulatory Visit | Attending: Gynecologic Oncology | Admitting: Gynecologic Oncology

## 2022-02-10 ENCOUNTER — Encounter (HOSPITAL_COMMUNITY): Payer: Self-pay

## 2022-02-10 ENCOUNTER — Telehealth: Payer: Self-pay

## 2022-02-10 VITALS — BP 134/84 | HR 72 | Temp 98.2°F | Resp 14 | Ht 63.0 in | Wt 100.0 lb

## 2022-02-10 DIAGNOSIS — I493 Ventricular premature depolarization: Secondary | ICD-10-CM | POA: Insufficient documentation

## 2022-02-10 DIAGNOSIS — I083 Combined rheumatic disorders of mitral, aortic and tricuspid valves: Secondary | ICD-10-CM | POA: Insufficient documentation

## 2022-02-10 DIAGNOSIS — R1900 Intra-abdominal and pelvic swelling, mass and lump, unspecified site: Secondary | ICD-10-CM | POA: Diagnosis not present

## 2022-02-10 DIAGNOSIS — Z79899 Other long term (current) drug therapy: Secondary | ICD-10-CM | POA: Diagnosis not present

## 2022-02-10 DIAGNOSIS — Z01818 Encounter for other preprocedural examination: Secondary | ICD-10-CM | POA: Diagnosis present

## 2022-02-10 DIAGNOSIS — I10 Essential (primary) hypertension: Secondary | ICD-10-CM | POA: Insufficient documentation

## 2022-02-10 DIAGNOSIS — I2510 Atherosclerotic heart disease of native coronary artery without angina pectoris: Secondary | ICD-10-CM

## 2022-02-10 DIAGNOSIS — R9389 Abnormal findings on diagnostic imaging of other specified body structures: Secondary | ICD-10-CM | POA: Diagnosis not present

## 2022-02-10 LAB — COMPREHENSIVE METABOLIC PANEL
ALT: 19 U/L (ref 0–44)
AST: 23 U/L (ref 15–41)
Albumin: 4.2 g/dL (ref 3.5–5.0)
Alkaline Phosphatase: 35 U/L — ABNORMAL LOW (ref 38–126)
Anion gap: 7 (ref 5–15)
BUN: 25 mg/dL — ABNORMAL HIGH (ref 8–23)
CO2: 25 mmol/L (ref 22–32)
Calcium: 9.1 mg/dL (ref 8.9–10.3)
Chloride: 106 mmol/L (ref 98–111)
Creatinine, Ser: 1.11 mg/dL — ABNORMAL HIGH (ref 0.44–1.00)
GFR, Estimated: 52 mL/min — ABNORMAL LOW (ref >60)
Glucose, Bld: 96 mg/dL (ref 70–99)
Potassium: 4 mmol/L (ref 3.5–5.1)
Sodium: 138 mmol/L (ref 135–145)
Total Bilirubin: 0.8 mg/dL (ref 0.3–1.2)
Total Protein: 6.7 g/dL (ref 6.5–8.1)

## 2022-02-10 LAB — CBC
HCT: 40.3 % (ref 36.0–46.0)
Hemoglobin: 13.2 g/dL (ref 12.0–15.0)
MCH: 32 pg (ref 26.0–34.0)
MCHC: 32.8 g/dL (ref 30.0–36.0)
MCV: 97.8 fL (ref 80.0–100.0)
Platelets: 257 10*3/uL (ref 150–400)
RBC: 4.12 MIL/uL (ref 3.87–5.11)
RDW: 12.2 % (ref 11.5–15.5)
WBC: 4.7 10*3/uL (ref 4.0–10.5)
nRBC: 0 % (ref 0.0–0.2)

## 2022-02-10 NOTE — Telephone Encounter (Signed)
Recent lab results faxed and confirmed to PCP  Princess Bruins MD Fax# [phone]

## 2022-02-10 NOTE — Telephone Encounter (Signed)
Pt aware of elevated BUN and Creatinine levels from recent labs. Per Joylene John NP, Pt to stay hydrated and avoid frequent use of NSAIDS.   Pt voiced an understanding and states she was dehydrated today when the labs were drawn.

## 2022-02-11 NOTE — Progress Notes (Signed)
Anesthesia Chart Review   Case: [id number] Date/Time: 02/18/22 1045   Procedures:      XI ROBOTIC ASSISTED TOTAL HYSTERECTOMY WITH BILATERAL SALPINGO OOPHORECTOMY     MINI LAPAROTOMY WITH POSSIBLE STAGING   Anesthesia type: General   Pre-op diagnosis: THICKENED ENDOMETRIUM, PELVIC MASS   Location: [facility] ROOM 05 / [facility]   Surgeons: Lafonda Mosses, MD       DISCUSSION:76 y.o. never smoker with h/o HTN, MVP, thickened endometrium, pelvic mass scheduled for above procedure 02/18/2022 with Dr. Jeral Pinch.   Recent Echo 02/01/2022 with EF 60-65%, severe mitral regurgitation, mild aortic regurgitation, no aortic stenosis.   Per Dr. Gwenlyn Found, "Pt cleared at low risk for upcoming surgery."  Anticipate pt can proceed with planned procedure barring acute status change.   VS: BP 134/84 Comment: right arm sitting  Pulse 72   Temp 36.8 C (Oral)   Resp 14   Ht '5\' 3"'$  (1.6 m)   Wt 45.4 kg   BMI 17.71 kg/m   PROVIDERS: Princess Bruins, MD is PCP   Cardiologist - Quay Burow, MD  LABS: Labs reviewed: Acceptable for surgery. (all labs ordered are listed, but only abnormal results are displayed)  Labs Reviewed  COMPREHENSIVE METABOLIC PANEL - Abnormal; Notable for the following components:      Result Value   BUN 25 (*)    Creatinine, Ser 1.11 (*)    Alkaline Phosphatase 35 (*)    GFR, Estimated 52 (*)    All other components within normal limits  CBC  TYPE AND SCREEN     IMAGES:   EKG:   CV: Echo 02/01/2022 1. Left ventricular ejection fraction, by estimation, is 60 to 65%. The  left ventricle has normal function. The left ventricle has no regional  wall motion abnormalities. The left ventricular internal cavity size was  posterior. There is severe asymmetric   left ventricular hypertrophy. Left ventricular diastolic parameters are  indeterminate.   2. Right ventricular systolic function is normal. The right ventricular  size is normal. There is normal  pulmonary artery systolic pressure.   3. Left atrial size was severely dilated.   4. The mitral valve is myxomatous. Severe mitral valve regurgitation. No  evidence of mitral stenosis. There is moderate holosystolic prolapse of  both leaflets of the mitral valve.   5. Tricuspid valve regurgitation is mild to moderate.   6. The aortic valve is tricuspid. Aortic valve regurgitation is mild. No  aortic stenosis is present.   7. The inferior vena cava is normal in size with greater than 50%  respiratory variability, suggesting right atrial pressure of 3 mmHg.  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    cardiologist--- dr berry---  long hx of known mvp, requires antibiotic prophylaxis;  last echo in epic 05-21-2020 ef 60-65%, G2DD, bileaflet MV prolapse w/ severe MR, mild-mod TR, severe LAE;  per cardiology note pt is asymptomatic walks several miles daily   Osteoporosis    "RIGHT  HIP BONE DENSITY LOSS AS OF 05/27/21",NOT ON MEDICATIONS"   Primary hyperparathyroidism [facility])    endocrinologist--- dr Chalmers Cater   Wears contact lenses     Past Surgical History:  Procedure Laterality Date   BREAST BIOPSY Left 2018   Benign   BREAST SURGERY Left 1990   Benign breast cyst   COLONOSCOPY     2023 at [facility] N/A 11/18/2020   Procedure: [place];  Surgeon: Princess Bruins, MD;  Location: [facility] [place];  Service: Gynecology;  Laterality: N/A;    PARATHYROIDECTOMY N/A 01/23/2021   Procedure: NECK EXPLORATION WITH PARATHYROIDECTOMY;  Surgeon: Armandina Gemma, MD;  Location: [facility];  Service: General;  Laterality: N/A;   WISDOM TOOTH EXTRACTION      MEDICATIONS:  aspirin 81 MG tablet   aspirin-acetaminophen-caffeine (EXCEDRIN MIGRAINE) 250-250-65 MG tablet   cetirizine (ZYRTEC) 10 MG tablet   diphenhydramine-acetaminophen (TYLENOL PM) 25-500 MG TABS tablet   famotidine (PEPCID) 10 MG tablet   fluticasone (FLONASE) 50 MCG/ACT nasal spray   irbesartan (AVAPRO) 150 MG tablet   levocetirizine (XYZAL) 5 MG tablet   levothyroxine (SYNTHROID) 88 MCG tablet   senna-docusate (SENOKOT-S) 8.6-50 MG tablet   SYNTHROID 100 MCG tablet   traMADol (ULTRAM) 50 MG tablet    0.9 %  sodium chloride infusion   Kailyn Vanderslice Ward, PA-C [facility]-Surgical Testing [phone]

## 2022-02-12 ENCOUNTER — Other Ambulatory Visit (HOSPITAL_COMMUNITY): Payer: Medicare Other

## 2022-02-17 ENCOUNTER — Telehealth: Payer: Self-pay

## 2022-02-17 NOTE — Telephone Encounter (Signed)
Called patient to review pre-op instructions for surgery scheduled 02/18/22. Patient verbalized arrival time, NPO instructions. Patient had just finished a 4 mile walk prior to our conversation. She states that she is ready for her surgery. Encouraged patient to call clinic with any questions or concerns. She is aware that we will call day after surgery to answer questions or concerns.

## 2022-02-18 ENCOUNTER — Ambulatory Visit (HOSPITAL_COMMUNITY)
Admission: RE | Admit: 2022-02-18 | Discharge: 2022-02-18 | Disposition: A | Discharge status: Home / Self Care | Payer: Medicare Other | Source: Ambulatory Visit | Attending: Gynecologic Oncology | Admitting: Gynecologic Oncology

## 2022-02-18 ENCOUNTER — Encounter (HOSPITAL_COMMUNITY)
Admission: RE | Disposition: A | Discharge status: Home / Self Care | Payer: Self-pay | Source: Ambulatory Visit | Attending: Gynecologic Oncology

## 2022-02-18 ENCOUNTER — Ambulatory Visit (HOSPITAL_BASED_OUTPATIENT_CLINIC_OR_DEPARTMENT_OTHER): Payer: Medicare Other | Admitting: Anesthesiology

## 2022-02-18 ENCOUNTER — Telehealth: Payer: Medicare Other | Admitting: Gynecologic Oncology

## 2022-02-18 ENCOUNTER — Other Ambulatory Visit: Payer: Self-pay

## 2022-02-18 ENCOUNTER — Encounter (HOSPITAL_COMMUNITY): Payer: Self-pay | Admitting: Gynecologic Oncology

## 2022-02-18 ENCOUNTER — Ambulatory Visit (HOSPITAL_COMMUNITY): Payer: Medicare Other | Admitting: Physician Assistant

## 2022-02-18 DIAGNOSIS — Z79899 Other long term (current) drug therapy: Secondary | ICD-10-CM | POA: Insufficient documentation

## 2022-02-18 DIAGNOSIS — E213 Hyperparathyroidism, unspecified: Secondary | ICD-10-CM | POA: Insufficient documentation

## 2022-02-18 DIAGNOSIS — Z7722 Contact with and (suspected) exposure to environmental tobacco smoke (acute) (chronic): Secondary | ICD-10-CM

## 2022-02-18 DIAGNOSIS — E039 Hypothyroidism, unspecified: Secondary | ICD-10-CM

## 2022-02-18 DIAGNOSIS — D251 Intramural leiomyoma of uterus: Secondary | ICD-10-CM | POA: Diagnosis not present

## 2022-02-18 DIAGNOSIS — D270 Benign neoplasm of right ovary: Secondary | ICD-10-CM | POA: Diagnosis not present

## 2022-02-18 DIAGNOSIS — D259 Leiomyoma of uterus, unspecified: Secondary | ICD-10-CM

## 2022-02-18 DIAGNOSIS — N838 Other noninflammatory disorders of ovary, fallopian tube and broad ligament: Secondary | ICD-10-CM

## 2022-02-18 DIAGNOSIS — N8003 Adenomyosis of the uterus: Secondary | ICD-10-CM | POA: Insufficient documentation

## 2022-02-18 DIAGNOSIS — R1900 Intra-abdominal and pelvic swelling, mass and lump, unspecified site: Secondary | ICD-10-CM | POA: Diagnosis present

## 2022-02-18 DIAGNOSIS — I10 Essential (primary) hypertension: Secondary | ICD-10-CM | POA: Insufficient documentation

## 2022-02-18 DIAGNOSIS — I083 Combined rheumatic disorders of mitral, aortic and tricuspid valves: Secondary | ICD-10-CM | POA: Insufficient documentation

## 2022-02-18 DIAGNOSIS — N888 Other specified noninflammatory disorders of cervix uteri: Secondary | ICD-10-CM

## 2022-02-18 HISTORY — PX: LAPAROTOMY WITH STAGING: SHX5938

## 2022-02-18 HISTORY — PX: ROBOTIC ASSISTED TOTAL HYSTERECTOMY WITH BILATERAL SALPINGO OOPHERECTOMY: SHX6086

## 2022-02-18 LAB — ABO/RH: ABO/RH(D): B POS

## 2022-02-18 LAB — TYPE AND SCREEN
ABO/RH(D): B POS
Antibody Screen: NEGATIVE

## 2022-02-18 SURGERY — HYSTERECTOMY, TOTAL, ROBOT-ASSISTED, LAPAROSCOPIC, WITH BILATERAL SALPINGO-OOPHORECTOMY
Anesthesia: General

## 2022-02-18 MED ORDER — BUPIVACAINE HCL 0.25 % IJ SOLN
INTRAMUSCULAR | Status: DC | PRN
  Administered 2022-02-18: 30 mL
  Administered 2022-02-18: 20 mL

## 2022-02-18 MED ORDER — BUPIVACAINE LIPOSOME 1.3 % IJ SUSP
INTRAMUSCULAR | Status: DC | PRN
  Administered 2022-02-18: 20 mL

## 2022-02-18 MED ORDER — CEFAZOLIN SODIUM-DEXTROSE 2-4 GM/100ML-% IV SOLN
2.0000 g | INTRAVENOUS | Status: AC
  Administered 2022-02-18: 2 g via INTRAVENOUS
  Filled 2022-02-18: qty 100

## 2022-02-18 MED ORDER — ONDANSETRON HCL 4 MG/2ML IJ SOLN
INTRAMUSCULAR | Status: DC | PRN
  Administered 2022-02-18: 4 mg via INTRAVENOUS

## 2022-02-18 MED ORDER — DEXAMETHASONE SODIUM PHOSPHATE 10 MG/ML IJ SOLN
INTRAMUSCULAR | Status: AC
  Filled 2022-02-18: qty 1

## 2022-02-18 MED ORDER — FENTANYL CITRATE (PF) 100 MCG/2ML IJ SOLN
INTRAMUSCULAR | Status: DC | PRN
  Administered 2022-02-18 (×5): 50 ug via INTRAVENOUS

## 2022-02-18 MED ORDER — ACETAMINOPHEN 500 MG PO TABS
1000.0000 mg | ORAL_TABLET | ORAL | Status: AC
  Administered 2022-02-18: 1000 mg via ORAL
  Filled 2022-02-18: qty 2

## 2022-02-18 MED ORDER — SUGAMMADEX SODIUM 500 MG/5ML IV SOLN
INTRAVENOUS | Status: DC | PRN
  Administered 2022-02-18: 200 mg via INTRAVENOUS

## 2022-02-18 MED ORDER — LIDOCAINE HCL (PF) 2 % IJ SOLN
INTRAMUSCULAR | Status: AC
  Filled 2022-02-18: qty 15

## 2022-02-18 MED ORDER — ORAL CARE MOUTH RINSE: 15.0000 mL | Freq: Once | OROMUCOSAL | Status: AC

## 2022-02-18 MED ORDER — HEPARIN SODIUM (PORCINE) 5000 UNIT/ML IJ SOLN
5000.0000 [IU] | INTRAMUSCULAR | Status: AC
  Administered 2022-02-18: 5000 [IU] via SUBCUTANEOUS
  Filled 2022-02-18: qty 1

## 2022-02-18 MED ORDER — POVIDONE-IODINE 10 % EX SWAB
2.0000 | Freq: Once | CUTANEOUS | Status: AC
  Administered 2022-02-18: 2 via TOPICAL

## 2022-02-18 MED ORDER — ASPIRIN-ACETAMINOPHEN-CAFFEINE 250-250-65 MG PO TABS: 1.0000 | ORAL_TABLET | Freq: Four times a day (QID) | ORAL | 0 refills | Status: DC | PRN

## 2022-02-18 MED ORDER — CHLORHEXIDINE GLUCONATE 0.12 % MT SOLN
15.0000 mL | Freq: Once | OROMUCOSAL | Status: AC
  Administered 2022-02-18: 15 mL via OROMUCOSAL

## 2022-02-18 MED ORDER — PROPOFOL 10 MG/ML IV BOLUS
INTRAVENOUS | Status: DC | PRN
  Administered 2022-02-18: 20 mg via INTRAVENOUS
  Administered 2022-02-18: 80 mg via INTRAVENOUS

## 2022-02-18 MED ORDER — DEXAMETHASONE SODIUM PHOSPHATE 4 MG/ML IJ SOLN
4.0000 mg | INTRAMUSCULAR | Status: AC
  Administered 2022-02-18: 4 mg via INTRAVENOUS

## 2022-02-18 MED ORDER — ONDANSETRON HCL 4 MG/2ML IJ SOLN
INTRAMUSCULAR | Status: AC
  Filled 2022-02-18: qty 2

## 2022-02-18 MED ORDER — LIDOCAINE HCL (PF) 2 % IJ SOLN
INTRAMUSCULAR | Status: DC | PRN
  Administered 2022-02-18: 1.5 mg/kg/h via INTRADERMAL

## 2022-02-18 MED ORDER — ASPIRIN 81 MG PO TABS: 81.0000 mg | ORAL_TABLET | Freq: Every day | ORAL | 3 refills | Status: AC

## 2022-02-18 MED ORDER — EPHEDRINE SULFATE (PRESSORS) 50 MG/ML IJ SOLN
INTRAMUSCULAR | Status: DC | PRN
  Administered 2022-02-18 (×4): 5 mg via INTRAVENOUS

## 2022-02-18 MED ORDER — BUPIVACAINE LIPOSOME 1.3 % IJ SUSP
INTRAMUSCULAR | Status: AC
  Filled 2022-02-18: qty 20

## 2022-02-18 MED ORDER — BUPIVACAINE HCL 0.25 % IJ SOLN
INTRAMUSCULAR | Status: AC
  Filled 2022-02-18: qty 1

## 2022-02-18 MED ORDER — LIDOCAINE HCL (CARDIAC) PF 100 MG/5ML IV SOSY
PREFILLED_SYRINGE | INTRAVENOUS | Status: DC | PRN
  Administered 2022-02-18: 50 mg via INTRAVENOUS

## 2022-02-18 MED ORDER — LACTATED RINGERS IV SOLN: INTRAVENOUS | Status: DC

## 2022-02-18 MED ORDER — ROCURONIUM BROMIDE 100 MG/10ML IV SOLN
INTRAVENOUS | Status: DC | PRN
  Administered 2022-02-18: 10 mg via INTRAVENOUS
  Administered 2022-02-18: 50 mg via INTRAVENOUS

## 2022-02-18 MED ORDER — PHENYLEPHRINE HCL (PRESSORS) 10 MG/ML IV SOLN
INTRAVENOUS | Status: DC | PRN
  Administered 2022-02-18 (×5): 80 ug via INTRAVENOUS
  Administered 2022-02-18: 160 ug via INTRAVENOUS
  Administered 2022-02-18 (×3): 80 ug via INTRAVENOUS

## 2022-02-18 MED ORDER — STERILE WATER FOR IRRIGATION IR SOLN
Status: DC | PRN
  Administered 2022-02-18: 1000 mL

## 2022-02-18 MED ORDER — LACTATED RINGERS IR SOLN
Status: DC | PRN
  Administered 2022-02-18: 1000 mL

## 2022-02-18 MED ORDER — ROCURONIUM BROMIDE 10 MG/ML (PF) SYRINGE
PREFILLED_SYRINGE | INTRAVENOUS | Status: AC
  Filled 2022-02-18: qty 10

## 2022-02-18 MED ORDER — LACTATED RINGERS IV SOLN: INTRAVENOUS | Status: DC | PRN

## 2022-02-18 MED ORDER — FENTANYL CITRATE (PF) 250 MCG/5ML IJ SOLN
INTRAMUSCULAR | Status: AC
  Filled 2022-02-18: qty 5

## 2022-02-18 SURGICAL SUPPLY — 80 items
APPLICATOR SURGIFLO ENDO (HEMOSTASIS) IMPLANT
BAG ISOLATION DRAPE 18X18 (DRAPES) IMPLANT
BAG LAPAROSCOPIC 12 15 PORT 16 (BASKET) IMPLANT
BAG RETRIEVAL 12/15 (BASKET) ×1
BLADE SURG SZ10 CARB STEEL (BLADE) IMPLANT
COVER BACK TABLE 60X90IN (DRAPES) ×1 IMPLANT
COVER TIP SHEARS 8 DVNC (MISCELLANEOUS) ×1 IMPLANT
COVER TIP SHEARS 8MM DA VINCI (MISCELLANEOUS) ×1
DERMABOND ADVANCED .7 DNX12 (GAUZE/BANDAGES/DRESSINGS) ×1 IMPLANT
DERMABOND ADVANCED .7 DNX6 (GAUZE/BANDAGES/DRESSINGS) IMPLANT
DRAPE ARM DVNC X/XI (DISPOSABLE) ×4 IMPLANT
DRAPE COLUMN DVNC XI (DISPOSABLE) ×1 IMPLANT
DRAPE DA VINCI XI ARM (DISPOSABLE) ×4
DRAPE DA VINCI XI COLUMN (DISPOSABLE) ×1
DRAPE ISOLATION BAG 18X18 (DRAPES) ×1
DRAPE SHEET LG 3/4 BI-LAMINATE (DRAPES) ×1 IMPLANT
DRAPE SURG IRRIG POUCH 19X23 (DRAPES) ×1 IMPLANT
DRSG OPSITE POSTOP 4X6 (GAUZE/BANDAGES/DRESSINGS) IMPLANT
DRSG OPSITE POSTOP 4X8 (GAUZE/BANDAGES/DRESSINGS) IMPLANT
ELECT PENCIL ROCKER SW 15FT (MISCELLANEOUS) IMPLANT
ELECT REM PT RETURN 15FT ADLT (MISCELLANEOUS) ×1 IMPLANT
GAUZE 4X4 16PLY ~~LOC~~+RFID DBL (SPONGE) ×2 IMPLANT
GLOVE BIO SURGEON STRL SZ 6 (GLOVE) ×4 IMPLANT
GLOVE BIO SURGEON STRL SZ 6.5 (GLOVE) ×1 IMPLANT
GOWN STRL REUS W/ TWL LRG LVL3 (GOWN DISPOSABLE) ×4 IMPLANT
GOWN STRL REUS W/TWL LRG LVL3 (GOWN DISPOSABLE) ×4
GRASPER SUT TROCAR 14GX15 (MISCELLANEOUS) IMPLANT
HANDLE SUCTION POOLE (INSTRUMENTS) IMPLANT
HOLDER FOLEY CATH W/STRAP (MISCELLANEOUS) IMPLANT
IRRIG SUCT STRYKERFLOW 2 WTIP (MISCELLANEOUS) ×1
IRRIGATION SUCT STRKRFLW 2 WTP (MISCELLANEOUS) ×1 IMPLANT
KIT PROCEDURE DA VINCI SI (MISCELLANEOUS)
KIT PROCEDURE DVNC SI (MISCELLANEOUS) IMPLANT
KIT TURNOVER KIT A (KITS) IMPLANT
LIGASURE IMPACT 36 18CM CVD LR (INSTRUMENTS) IMPLANT
MANIPULATOR ADVINCU DEL 3.0 PL (MISCELLANEOUS) IMPLANT
MANIPULATOR ADVINCU DEL 3.5 PL (MISCELLANEOUS) IMPLANT
MANIPULATOR UTERINE 4.5 ZUMI (MISCELLANEOUS) IMPLANT
NDL HYPO 21X1.5 SAFETY (NEEDLE) ×1 IMPLANT
NDL SPNL 18GX3.5 QUINCKE PK (NEEDLE) IMPLANT
NEEDLE HYPO 21X1.5 SAFETY (NEEDLE) ×1 IMPLANT
NEEDLE SPNL 18GX3.5 QUINCKE PK (NEEDLE) IMPLANT
OBTURATOR OPTICAL STANDARD 8MM (TROCAR) ×1
OBTURATOR OPTICAL STND 8 DVNC (TROCAR) ×1
OBTURATOR OPTICALSTD 8 DVNC (TROCAR) ×1 IMPLANT
PACK ROBOT GYN CUSTOM WL (TRAY / TRAY PROCEDURE) ×1 IMPLANT
PAD POSITIONING PINK XL (MISCELLANEOUS) ×1 IMPLANT
PORT ACCESS TROCAR AIRSEAL 12 (TROCAR) IMPLANT
SEAL CANN UNIV 5-8 DVNC XI (MISCELLANEOUS) ×3 IMPLANT
SEAL XI 5MM-8MM UNIVERSAL (MISCELLANEOUS) ×3
SET TRI-LUMEN FLTR TB AIRSEAL (TUBING) ×1 IMPLANT
SOL PREP POV-IOD 4OZ 10% (MISCELLANEOUS) ×2 IMPLANT
SPIKE FLUID TRANSFER (MISCELLANEOUS) ×1 IMPLANT
SPONGE T-LAP 18X18 ~~LOC~~+RFID (SPONGE) IMPLANT
SUCTION POOLE HANDLE (INSTRUMENTS) ×1
SURGIFLO W/THROMBIN 8M KIT (HEMOSTASIS) IMPLANT
SUT MNCRL AB 4-0 PS2 18 (SUTURE) IMPLANT
SUT PDS AB 1 TP1 96 (SUTURE) IMPLANT
SUT V-LOC 180 0-0 GS22 (SUTURE) IMPLANT
SUT VIC AB 0 CT1 27 (SUTURE)
SUT VIC AB 0 CT1 27XBRD ANTBC (SUTURE) IMPLANT
SUT VIC AB 2-0 CT1 27 (SUTURE)
SUT VIC AB 2-0 CT1 TAPERPNT 27 (SUTURE) IMPLANT
SUT VIC AB 2-0 SH 27 (SUTURE) ×1
SUT VIC AB 2-0 SH 27X BRD (SUTURE) IMPLANT
SUT VIC AB 4-0 PS2 18 (SUTURE) ×2 IMPLANT
SUT VLOC 180 0 9IN  GS21 (SUTURE)
SUT VLOC 180 0 9IN GS21 (SUTURE) IMPLANT
SYR 10ML LL (SYRINGE) IMPLANT
SYS BAG RETRIEVAL 10MM (BASKET)
SYS WOUND ALEXIS 18CM MED (MISCELLANEOUS) ×1
SYSTEM BAG RETRIEVAL 10MM (BASKET) IMPLANT
SYSTEM WOUND ALEXIS 18CM MED (MISCELLANEOUS) IMPLANT
TOWEL OR NON WOVEN STRL DISP B (DISPOSABLE) IMPLANT
TRAP SPECIMEN MUCUS 40CC (MISCELLANEOUS) IMPLANT
TRAY FOLEY MTR SLVR 16FR STAT (SET/KITS/TRAYS/PACK) ×1 IMPLANT
TROCAR PORT AIRSEAL 5X120 (TROCAR) IMPLANT
UNDERPAD 30X36 HEAVY ABSORB (UNDERPADS AND DIAPERS) ×2 IMPLANT
WATER STERILE IRR 1000ML POUR (IV SOLUTION) ×1 IMPLANT
YANKAUER SUCT BULB TIP 10FT TU (MISCELLANEOUS) IMPLANT

## 2022-02-18 NOTE — Op Note (Signed)
OPERATIVE NOTE  Pre-operative Diagnosis: Adnexal mass, fibroid versus ovarian mass  Post-operative Diagnosis: Ovarian fibroma (right), normal uterus and left adnexa.  Operation: Robotic-assisted laparoscopic total hysterectomy with bilateral salpingo-oophorectomy, mini laparotomy for right ovarian extraction  Surgeon: Jeral Pinch MD  Assistant Surgeon: Joylene John NP  Anesthesia: GET  Urine Output: 150 cc  Operative Findings: On EUA, large, approximately 14 cm mobile mass, feels separate from the uterus but moves in conjunction with it.  On intra-abdominal entry, normal appearing diaphragm.  Liver edge with some scarring bilaterally.  Normal-appearing stomach, omentum, small and large bowel.  No ascites.  14 cm mass replacing the right ovary, white and smooth in appearance.  Normal-appearing right fallopian tube.  Left tube and ovary normal-appearing with some evidence of endosalpingosis.  Uterus approximately 8 cm in size, normal in appearance. No intra-abdominal or pelvic evidence of metastatic disease.  On frozen section, endometrium normal, right ovary consistent with a fibroma.  Estimated Blood Loss:  75 cc      Total IV Fluids: see I&O flowsheet         Specimens: uterus, cervix, bilateral tubes and ovaries, pelvic washings         Complications:  None apparent; patient tolerated the procedure well.         Disposition: PACU - hemodynamically stable.  Procedure Details  The patient was seen in the Holding Room. The risks, benefits, complications, treatment options, and expected outcomes were discussed with the patient.  The patient concurred with the proposed plan, giving informed consent.  The site of surgery properly noted/marked. The patient was identified as Brenda Cobb and the procedure verified as a Robotic-assisted hysterectomy with bilateral salpingo oophorectomy, possible staging.   After induction of anesthesia, the patient was draped and prepped in the  usual sterile manner. Patient was placed in supine position after anesthesia and draped and prepped in the usual sterile manner as follows: Her arms were tucked to her side with all appropriate precautions.  The patient was secured to the bed using padding and tape across her chest.  The patient was placed in the semi-lithotomy position in [place].  The perineum and vagina were prepped with Betadine. The patient's abdomen was prepped with ChloraPrep and then she was draped after the prep had been allowed to dry for 3 minutes.  A Time Out was held and the above information confirmed.  The urethra was prepped with Betadine. Foley catheter was placed.  A sterile speculum was placed in the vagina.  The cervix was grasped with a single-tooth tenaculum. The cervix was dilated with Kennon Rounds dilators.  The ZUMI uterine manipulator without a colpotomizer ring was placed without difficulty.  A pneum occluder balloon was placed over the manipulator.  OG tube placement was confirmed and to suction.   Next, a 10 mm skin incision was made 1 cm below the subcostal margin in the midclavicular line.  The 5 mm Optiview port and scope was used for direct entry.  Opening pressure was under 10 mm CO2.  The abdomen was insufflated and the findings were noted as above.   At this point and all points during the procedure, the patient's intra-abdominal pressure did not exceed 15 mmHg. Next, an 8 mm skin incision was made superior to the umbilicus and a right and left port were placed about 8 cm lateral to the robot port on the right and left side.  A fourth arm was placed on the right.  The 5 mm assist trocar  was exchanged for a 10-12 mm port. All ports were placed under direct visualization.  The patient was placed in steep Trendelenburg.  Bowel was folded away into the upper abdomen.  The robot was docked in the normal manner.  Pelvic washings were collected.  Given the size of the right adnexal mass, decision made to proceed  with RSO first.  The right retroperitoneum was opened parallel to the IP ligament.  The ureter was noted to be along the medial leaf of the broad ligament.  The peritoneum above the ureter was incised and stretched.  The IP ligament was skeletonized, cauterized and cut.  With traction on the adnexa, the utero-ovarian ligament and fallopian tube were cauterized and transected just lateral to the uterine fundus on the right.  The mass was too large and heavy to place in a 15 mm Endo Catch bag and was moved to the upper abdomen.  The left peritoneum was opened parallel to the IP ligament to open the retroperitoneal space. The round ligaments were transected. The ureter was noted to be on the medial leaf of the broad ligament.  The peritoneum above the ureter on the left was incised and stretched and the infundibulopelvic ligament was skeletonized, cauterized and cut.    The posterior peritoneum was taken down to the level of the cervix.  The anterior peritoneum was also taken down.  At this time, the [place] manipulator was removed and an EEA sizer was placed within the vagina.  The bladder flap was created to the level of the EEA sizer.  The uterine artery on the right side was skeletonized, cauterized and cut in the normal manner.  A similar procedure was performed on the left.  The colpotomy was made and the uterus, cervix, left tube and ovary were amputated and delivered through the vagina.  Uterine specimen was sent for endometrial evaluation.  Pedicles were inspected and excellent hemostasis was achieved.    The colpotomy at the vaginal cuff was closed with 0 Vicryl on a CT1 needle in a running manner.  Irrigation was used and excellent hemostasis was achieved.   Robotic instruments were removed under direct visulaization.  The robot was undocked.  The fascia at the 10-12 port was closed with 0 Vicryl using a PMI fascial closure device under direct visualization.  The supraumbilical trocar was removed and  the incision extended 10-12 cm with a scalpel. The incision was carried down to and through the fascia, with the abdomen insufflated, using monopolar electrocautery. The peritoneal incision was extended under direct visualization.  The right ovarian mass was attempted to be placed in a large 20 L bag, but given its weight, this was not feasible.  The mass was then grasped with triple tooth Lahey's and brought to the skin incision.  The mass was morcellated until it could be delivered through the incision and handed off the field to be sent for frozen section.   Once frozen section returned, the mini-lap incision was closed with running #1 looped PDS tied in the midline. The subcutaneous tissue was irrigated and hemostasis achieved. Exparel was injected for local anesthesia. The PDS knots were buried using interrupted sutures of 2-0 Vicryl.  The subcuticular tissue of all incisions was closed with 4-0 Vicryl and the skin was closed with 4-0 Monocryl in a subcuticular manner.  Dermabond was applied.  Honeycomb dressing was applied to the mini lap.  The vagina was swabbed with  minimal bleeding noted. Foley catheter was removed.  All sponge,  lap and needle counts were correct x  3.   The patient was transferred to the recovery room in stable condition.  Jeral Pinch, MD

## 2022-02-18 NOTE — Discharge Instructions (Addendum)
AFTER SURGERY INSTRUCTIONS   Return to work: 4-6 weeks if applicable  You will have a white honeycomb dressing over your larger incision. This dressing can be removed 5 days after surgery and you do not need to reapply a new dressing. Once you remove the dressing, you will notice that you have the surgical glue (dermabond) on the incision and this will peel off on its own. You can get this dressing wet in the shower the days after surgery prior to removal on the 5th day.   Today Dr. Berline Lopes removed the uterus, cervix, both fallopian tubes and ovaries. The uterus was normal and the mass was coming from the right ovary (not the uterus). Under the microscope, they did not feel this was a cancer but we will await the results of the final pathology.  DO NOT RESUME YOUR ASPIRIN OR ASPIRIN PRODUCTS FOR 3 DAYS AFTER SURGERY   Activity: 1. Be up and out of the bed during the day.  Take a nap if needed.  You may walk up steps but be careful and use the hand rail.  Stair climbing will tire you more than you think, you may need to stop part way and rest.    2. No lifting or straining for 6 weeks over 10 pounds. No pushing, pulling, straining for 6 weeks.   3. No driving for around 1 week(s).  Do not drive if you are taking narcotic pain medicine and make sure that your reaction time has returned.    4. You can shower as soon as the next day after surgery. Shower daily.  Use your regular soap and water (not directly on the incision) and pat your incision(s) dry afterwards; don't rub.  No tub baths or submerging your body in water until cleared by your surgeon. If you have the soap that was given to you by pre-surgical testing that was used before surgery, you do not need to use it afterwards because this can irritate your incisions.    5. No sexual activity and nothing in the vagina for 8-10 weeks.   6. You may experience a small amount of clear drainage from your incisions, which is normal.  If the drainage  persists, increases, or changes color please call the office.   7. Do not use creams, lotions, or ointments such as neosporin on your incisions after surgery until advised by your surgeon because they can cause removal of the dermabond glue on your incisions.     8. You may experience vaginal spotting after surgery or around the 6-8 week mark from surgery when the stitches at the top of the vagina begin to dissolve.  The spotting is normal but if you experience heavy bleeding, call our office.   9. Take Tylenol or ibuprofen first for pain if you are able to take these medications and only use narcotic pain medication for severe pain not relieved by the Tylenol or Ibuprofen.  Monitor your Tylenol intake to a max of 4,000 mg in a 24 hour period. You can alternate these medications after surgery.   Diet: 1. Low sodium Heart Healthy Diet is recommended but you are cleared to resume your normal (before surgery) diet after your procedure.   2. It is safe to use a laxative, such as Miralax or Colace, if you have difficulty moving your bowels. You have been prescribed Sennakot-S to take at bedtime every evening after surgery to keep bowel movements regular and to prevent constipation.     Wound  Care: 1. Keep clean and dry.  Shower daily.   Reasons to call the Doctor: Fever - Oral temperature greater than 100.4 degrees Fahrenheit Foul-smelling vaginal discharge Difficulty urinating Nausea and vomiting Increased pain at the site of the incision that is unrelieved with pain medicine. Difficulty breathing with or without chest pain New calf pain especially if only on one side Sudden, continuing increased vaginal bleeding with or without clots.   Contacts: For questions or concerns you should contact:   Dr. Jeral Pinch at [phone]   Joylene John, NP at [phone]   After Hours: call [phone] and have the GYN Oncologist paged/contacted (after 5 pm or on the weekends).   Messages  sent via mychart are for non-urgent matters and are not responded to after hours so for urgent needs, please call the after hours number.

## 2022-02-18 NOTE — Anesthesia Postprocedure Evaluation (Signed)
Anesthesia Post Note  Patient: Brenda Cobb  Procedure(s) Performed: XI ROBOTIC ASSISTED TOTAL HYSTERECTOMY WITH BILATERAL SALPINGO OOPHORECTOMY MINI LAPAROTOMY     Patient location during evaluation: PACU Anesthesia Type: General Level of consciousness: sedated and patient cooperative Pain management: pain level controlled Vital Signs Assessment: post-procedure vital signs reviewed and stable Respiratory status: spontaneous breathing Cardiovascular status: stable Anesthetic complications: no   No notable events documented.  Last Vitals:  Vitals:   02/18/22 1545 02/18/22 1600  BP: (!) 140/85 133/79  Pulse: 78 74  Resp: 13 15  Temp:  36.6 C  SpO2: 95% 97%    Last Pain:  Vitals:   02/18/22 1644  TempSrc:   PainSc: 0-No pain                 Nolon Nations

## 2022-02-18 NOTE — H&P (Signed)
Gynecologic Oncology H&P  02/18/22  Treatment History: I met the patient initially in 06/2020. She was noted to have a pelvic mass on routine exam prior to her visit with me.  Pelvic ultrasound revealed an enlarged uterus measuring 7.4 x 9.8 with pelvic mass associated with the lower uterine segment inferiorly into the right, measuring 9.9 x 11.6 cm, deviating the uterus to the left.  Endometrium approximately 4 mm but difficult to visualize secondary to the fibroid.  Normal bilateral ovaries.  MRI was then subsequently done on 05/29/2020 showing a large mass in the right pelvis, not clearly arising from the uterus but displacing it to the left.  Constellation of findings would favor an adnexal mass with little if any enhancement.  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  Fibrous more of the ovary and other low-grade ovarian neoplasms are also considered.  Thickening of the endometrium measuring 5 mm noted.  Small volume pelvic ascites noted.  Additionally, she had tumor markers with normal CA-125, 19 9, and a very mildly elevated CEA.   Given CEA, colonoscopy referral was suggested.  Patient underwent colonoscopy in March of this year, which was normal.   Pelvic ultrasound was repeated in June 2022 showing a similar size uterus with again a right pedunculated mass, more consistent with a fibroid.  Endometrium measuring 6 mm, cannot rule out a polyp.  I had a phone visit with the patient at this time.  Given what was overall felt to be a benign process, discussed follow-up imaging at the end of the year and continued work-up with her GYN for thickened endometrium.   11/18/20: Patient underwent hysteroscopy with resection of polypoid lesions.  This showed endometrium with focal simple hyperplasia without atypia.  Multiple fragments of endometrial polyp with inactive glands.  No atypia or malignancy.  Patient was  started on Provera.   03/26/21: Office pelvic ultrasound performed showing lateral fibroid again, measuring 10 x 9.5 cm.  This is slightly decreased in size to last ultrasound.  Endometrial lining measures 3.4 mm.  Right ovary not seen, left ovary atrophic.  Small amount of free fluid.   11/05/21: Office pelvic ultrasound reveals lateral fibroid measuring 11.8 x 10.2 cm.  Asymmetrically thickened heterogenous endometrium measures 21.6 mm and is minimally vascular.  Left ovary is atrophic with small avascular calcifications.  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   Endometrial biopsy performed on 11/18/20 showing fragments of inactive endometrium, no malignancy.   12/24/2021: Office pelvic ultrasound shows no significant change noted in the fibroid.  Endometrial lining measures 6.2 mm.  At least 1 feeder vessel identified to the area of thickening, possible polyp.  No change in avascular multicystic mass in the right adnexa measuring 4 x 2.4 x 2.3 cm.  Pocket of free fluid noted measuring 6 x 4.8 cm.   CA-125 on 10/12 was 26 (had been 27 in 2022).   Her medical history is notable for mitral valve prolapse.  She was seen last year for consideration of valve repair; recommendation at that time was to continue with close surveillance. She sees Dr. Gwenlyn Found yearly.  Interval History: Doing well, no change since her recent office visit.  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    cardiologist--- dr berry---  long hx of known mvp, requires antibiotic prophylaxis;  last echo in epic 05-21-2020 ef 60-65%, G2DD, bileaflet MV prolapse w/ severe MR, mild-mod TR, severe LAE;  per cardiology note pt is asymptomatic walks several miles daily   Osteoporosis    "RIGHT HIP BONE DENSITY LOSS AS OF 05/27/21",NOT ON MEDICATIONS"   Primary hyperparathyroidism [facility])    endocrinologist--- dr Chalmers Cater   Wears contact lenses     Past Surgical History:  Procedure Laterality Date   BREAST BIOPSY Left 2018   Benign   BREAST SURGERY Left 1990   Benign breast cyst   COLONOSCOPY     2023 at [place] N/A 11/18/2020   Procedure: Wilder;  Surgeon: Princess Bruins, MD;  Location: [facility];  Service: Gynecology;  Laterality: N/A;   PARATHYROIDECTOMY N/A 01/23/2021   Procedure: NECK EXPLORATION WITH PARATHYROIDECTOMY;  Surgeon: Armandina Gemma, MD;  Location: [facility];  Service: General;  Laterality: N/A;   WISDOM TOOTH EXTRACTION      Family History  Problem Relation Age of Onset   Alzheimer's disease Father    Aneurysm Brother    Breast cancer Maternal Aunt 61   ALS Maternal Grandmother    Parkinson's disease Maternal Grandfather    Breast cancer Paternal Grandmother 4   Colon cancer Neg Hx    Colon polyps Neg Hx    Esophageal cancer Neg Hx    Rectal cancer Neg Hx    Stomach cancer Neg Hx     Social History   Socioeconomic History   Marital status: Widowed    Spouse name: Not on file   Number of children: Not on file   Years of education: Not on file   Highest education level: Not on file  Occupational History   Not on file  Tobacco Use   Smoking status: Never    Passive exposure: Past ("MOTHER DID")   Smokeless tobacco: Never  Vaping Use   Vaping Use: Never used  Substance and Sexual Activity   Alcohol use: Yes    Comment: socially   Drug use:  Never   Sexual activity: Not Currently    Birth control/protection: Post-menopausal    Comment: 1st intercourse 76 yo-Fewer than 5 partners  Other Topics Concern   Not on file  Social History Narrative   Not on file   Social Determinants of Health   Financial Resource Strain: Not on file  Food Insecurity: Not on file  Transportation Needs: Not on file  Physical Activity: Not on file  Stress: Not on file  Social Connections: Not on file    Current Medications: No current facility-administered medications for this encounter.  Review of Systems: Denies appetite changes, fevers, chills, fatigue, unexplained weight changes. Denies hearing loss, neck lumps or masses, mouth sores, ringing in ears or voice changes. Denies cough or wheezing.  Denies shortness of breath. Denies chest pain or palpitations. Denies leg swelling. Denies abdominal distention, pain, blood in stools, constipation, diarrhea, nausea, vomiting, or early satiety. Denies pain with intercourse, dysuria, frequency, hematuria  or incontinence. Denies hot flashes, pelvic pain, vaginal bleeding or vaginal discharge.   Denies joint pain, back pain or muscle pain/cramps. Denies itching, rash, or wounds. Denies dizziness, headaches, numbness or seizures. Denies swollen lymph nodes or glands, denies easy bruising or bleeding. Denies anxiety, depression, confusion, or decreased concentration.  Physical Exam: There were no vitals taken for this visit. General: Alert, oriented, no acute distress.  HEENT: Normocephalic, atraumatic. Sclera anicteric.  Chest: Clear to auscultation bilaterally. No wheezes, rhonchi, or rales. Cardiovascular: Regular rate and rhythm, holosystolic murmur, no rubs. Abdomen: Normoactive bowel sounds. Soft, nondistended, nontender to palpation.  Mass appreciated in the lower pelvis almost up to the umbilicus, mobile from side to side.   Extremities: Grossly normal range of motion. Warm, well perfused.  No edema bilaterally.  Skin: No rashes or lesions.   Laboratory & Radiologic Studies: CBC    Component Value Date/Time   WBC 4.7 02/10/2022 0947   RBC 4.12 02/10/2022 0947   HGB 13.2 02/10/2022 0947   HGB 13.4 07/22/2020 0951   HCT 40.3 02/10/2022 0947   HCT 39.2 07/22/2020 0951   PLT 257 02/10/2022 0947   PLT 318 07/22/2020 0951   MCV 97.8 02/10/2022 0947   MCV 90 07/22/2020 0951   MCH 32.0 02/10/2022 0947   MCHC 32.8 02/10/2022 0947   RDW 12.2 02/10/2022 0947   RDW 12.1 07/22/2020 0951      Latest Ref Rng & Units 02/10/2022    9:47 AM 01/24/2021    5:11 AM 01/06/2021   10:19 AM  BMP  Glucose 70 - 99 mg/dL 96  99  96   BUN 8 - 23 mg/dL _0 Creatinine 0.44 - 1.00 mg/dL 1.11  0.91  0.83   Sodium 135 - 145 mmol/L 138  135  139   Potassium 3.5 - 5.1 mmol/L 4.0  4.6  4.8   Chloride 98 - 111 mmol/L 106  104  108   CO2 22 - 32 mmol/L _1 Calcium 8.9 - 10.3 mg/dL 9.1  9.2  10.3    Assessment & Plan: Brenda Cobb is a 76 y.o. woman with asymptomatic solid-appearing pelvic mass, likely either uterine fibroid or solid ovarian tumor, as well as a new, avascular mass most consistent with adnexal cyst or hydrosalpinx.   See counseling from 10/27. Plan for definitive surgery today with TRH/BSO, mini-lap, possible staging.  Jeral Pinch, MD  Division of Gynecologic Oncology  Department of Obstetrics and Gynecology  [facility]

## 2022-02-18 NOTE — Transfer of Care (Signed)
Immediate Anesthesia Transfer of Care Note  Patient: ANICKA STUCKERT  Procedure(s) Performed: XI ROBOTIC ASSISTED TOTAL HYSTERECTOMY WITH BILATERAL SALPINGO OOPHORECTOMY MINI LAPAROTOMY  Patient Location: PACU  Anesthesia Type:General  Level of Consciousness: awake, alert , oriented, and patient cooperative  Airway & Oxygen Therapy: Patient Spontanous Breathing and Patient connected to face mask oxygen  Post-op Assessment: Report given to RN, Post -op Vital signs reviewed and stable, and Patient moving all extremities X 4  Post vital signs: Reviewed and stable  Last Vitals:  Vitals Value Taken Time  BP 146/84 02/18/22 1526  Temp    Pulse 85 02/18/22 1528  Resp    SpO2 99 % 02/18/22 1528  Vitals shown include unvalidated device data.  Last Pain:  Vitals:   02/18/22 0952  TempSrc:   PainSc: 0-No pain      Patients Stated Pain Goal: 4 (30/05/11 0211)  Complications: No notable events documented.

## 2022-02-18 NOTE — Anesthesia Preprocedure Evaluation (Signed)
Anesthesia Evaluation    Reviewed: Allergy & Precautions, Patient's Chart, lab work & pertinent test results, Unable to perform ROS - Chart review only  History of Anesthesia Complications Negative for: history of anesthetic complications  Airway Mallampati: I  TM Distance: >3 FB Neck ROM: Full    Dental  (+) Dental Advisory Given   Pulmonary neg pulmonary ROS   Pulmonary exam normal        Cardiovascular hypertension, Pt. on medications Normal cardiovascular exam+ Valvular Problems/Murmurs (Mild-mod TR) MR, MVP and AI   Echo 01/2022  1. Left ventricular ejection fraction, by estimation, is 60 to 65%. The left ventricle has normal function. The left ventricle has no regional wall motion abnormalities. The left ventricular internal cavity size was posterior. There is severe asymmetric left ventricular hypertrophy. Left ventricular diastolic parameters are indeterminate.   2. Right ventricular systolic function is normal. The right ventricular size is normal. There is normal pulmonary artery systolic pressure.   3. Left atrial size was severely dilated.   4. The mitral valve is myxomatous. Severe mitral valve regurgitation. No evidence of mitral stenosis. There is moderate holosystolic prolapse of both leaflets of the mitral valve.   5. Tricuspid valve regurgitation is mild to moderate.   6. The aortic valve is tricuspid. Aortic valve regurgitation is mild. No aortic stenosis is present.   7. The inferior vena cava is normal in size with greater than 50% respiratory variability, suggesting right atrial pressure of 3 mmHg.   Comparison(s): No significant change from prior study. 05/21/20 EF 60-65%.  Severe MR.    '22 Cardiac MRI - There is no apparent mass in the left atrium. Side by side comparison performed to echocardiograms from 2020, 2021, and 2022. Lesion in question is adjacent to the right lower pulmonary vein on echo, however on  multiple views with thin slice assessment, perfusion, and post contrast images, no comparable lesion is seen on MRI. Suspect this finding on echo may be secondary to artifact or tissue plane.  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 The jet is broad based, eccentric and posteriorly directed and therefore unable to quantify based on PISA. 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 EF 60 to 65%. There is mild asymmetric left ventricular hypertrophy of the basal-septal segment. Grade II diastolic dysfunction (pseudonormalization). Left atrial size was severely dilated. Tricuspid valve regurgitation is mild to moderate. Aortic valve regurgitation is mild. Mild aortic valve sclerosis is present, with no evidence of AS   Neuro/Psych negative neurological ROS  negative psych ROS   GI/Hepatic negative GI ROS, Neg liver ROS,,,  Endo/Other  Hypothyroidism   Hyperparathyroidism   Renal/GU negative Renal ROS     Musculoskeletal negative musculoskeletal ROS (+)    Abdominal   Peds  Hematology negative hematology ROS (+)   Anesthesia Other Findings   Reproductive/Obstetrics                             Anesthesia Physical Anesthesia Plan  ASA: 4  Anesthesia Plan: General   Post-op Pain Management: Tylenol PO (pre-op)*, Gabapentin PO (pre-op)* and Lidocaine infusion*   Induction: Intravenous  PONV Risk Score and Plan: 3 and Treatment may vary due to age or medical condition,  Ondansetron and Dexamethasone  Airway Management Planned: Oral ETT  Additional Equipment: ClearSight  Intra-op Plan:   Post-operative Plan: Extubation in OR  Informed Consent: I have reviewed the patients History and Physical, chart, labs and discussed  the procedure including the risks, benefits and alternatives for the proposed anesthesia with the patient or authorized representative who has indicated his/her understanding and acceptance.     Dental advisory given  Plan Discussed with: CRNA and Anesthesiologist  Anesthesia Plan Comments: (2 x PIV)        Anesthesia Quick Evaluation

## 2022-02-18 NOTE — Anesthesia Procedure Notes (Signed)
Procedure Name: Intubation Date/Time: 02/18/2022 12:28 PM  Performed by: Jonna Munro, CRNAPre-anesthesia Checklist: Patient identified, Emergency Drugs available, Suction available, Patient being monitored and Timeout performed Patient Re-evaluated:Patient Re-evaluated prior to induction Oxygen Delivery Method: Circle system utilized Preoxygenation: Pre-oxygenation with 100% oxygen Induction Type: IV induction Ventilation: Mask ventilation without difficulty Laryngoscope Size: Mac and 3 Grade View: Grade I Tube type: Oral Tube size: 7.0 mm Number of attempts: 1 Airway Equipment and Method: Stylet Placement Confirmation: ETT inserted through vocal cords under direct vision, positive ETCO2, CO2 detector and breath sounds checked- equal and bilateral Secured at: 22 cm Tube secured with: Tape Dental Injury: Teeth and Oropharynx as per pre-operative assessment

## 2022-02-19 ENCOUNTER — Telehealth: Payer: Self-pay

## 2022-02-19 ENCOUNTER — Encounter (HOSPITAL_COMMUNITY): Payer: Self-pay | Admitting: Gynecologic Oncology

## 2022-02-19 NOTE — Telephone Encounter (Signed)
Spoke with Brenda Cobb this morning. Prior to my phone call patient had reviewed , in detail, all post-op instructions. She reports pain at 1/10  with Tylenol. Patient passing gas. She has Senna if need arises. Reviewed restrictions on lifting and vaginal instructions.  Provided patient with after hours number if urgent needs present over the weekend.  No concerns voiced.

## 2022-02-22 ENCOUNTER — Telehealth: Payer: Self-pay | Admitting: Surgery

## 2022-02-22 LAB — CYTOLOGY - NON PAP

## 2022-02-22 LAB — SURGICAL PATHOLOGY

## 2022-02-22 NOTE — Telephone Encounter (Signed)
Thank you for the update!

## 2022-02-22 NOTE — Telephone Encounter (Signed)
Patient called in stating she has still not had a bowel movement. Advised patient to increase senna to twice daily and take miralax daily. If she has still not had a bowel movement by tomorrow instructed to increase miralax to twice a day. Patient also states that on Friday she had an incidence of puffy skin on L eyelid that progressed to itchiness on chest, throat, and upper arm. Denies itching or swelling in throat or shortness of breath. States that she took benadryl and it cleared it up and hasn't had symptoms today. Dr Berline Lopes notified.

## 2022-02-25 ENCOUNTER — Inpatient Hospital Stay: Payer: Medicare Other | Attending: Gynecologic Oncology | Admitting: Gynecologic Oncology

## 2022-02-25 ENCOUNTER — Encounter: Payer: Self-pay | Admitting: Gynecologic Oncology

## 2022-02-25 DIAGNOSIS — Z90722 Acquired absence of ovaries, bilateral: Secondary | ICD-10-CM

## 2022-02-25 DIAGNOSIS — D259 Leiomyoma of uterus, unspecified: Secondary | ICD-10-CM

## 2022-02-25 DIAGNOSIS — R9389 Abnormal findings on diagnostic imaging of other specified body structures: Secondary | ICD-10-CM

## 2022-02-25 DIAGNOSIS — N83201 Unspecified ovarian cyst, right side: Secondary | ICD-10-CM

## 2022-02-25 DIAGNOSIS — Z90710 Acquired absence of both cervix and uterus: Secondary | ICD-10-CM

## 2022-02-25 NOTE — Progress Notes (Signed)
Gynecologic Oncology Telehealth Note: Gyn-Onc  I connected with [name] on 02/25/22 at  4:00 PM EST by telephone and verified that I am speaking with the correct person using two identifiers.  I discussed the limitations, risks, security and privacy concerns of performing an evaluation and management service by telemedicine and the availability of in-person appointments. I also discussed with the patient that there may be a patient responsible charge related to this service. The patient expressed understanding and agreed to proceed.  Other persons participating in the visit and their role in the encounter: none.  Patient's location: home Provider's location: [facility]  Reason for Visit:  follow-up   Treatment History: 02/18/22: Robotic-assisted laparoscopic total hysterectomy with bilateral salpingo-oophorectomy, mini laparotomy for right ovarian extraction   Interval History: Finally had BM on Tuesday. Itching resolved. Removed honeycomb dressing yesterday, incisions look good. Denies urinary symptoms.  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    cardiologist--- dr berry---  long hx of known mvp, requires antibiotic prophylaxis;  last echo in epic 05-21-2020 ef 60-65%, G2DD, bileaflet MV prolapse w/ severe MR, mild-mod TR, severe LAE;  per cardiology note pt is asymptomatic walks several miles daily   Osteoporosis    "RIGHT HIP BONE DENSITY LOSS AS OF 05/27/21",NOT ON MEDICATIONS"   Primary hyperparathyroidism  [facility])    endocrinologist--- dr Chalmers Cater   Wears contact lenses     Past Surgical History:  Procedure Laterality Date   BREAST BIOPSY Left 2018   Benign   BREAST SURGERY Left 1990   Benign breast cyst   COLONOSCOPY     2023 at [place] N/A 11/18/2020   Procedure: [place];  Surgeon: Princess Bruins, MD;  Location: [place];  Service: Gynecology;  Laterality: N/A;   LAPAROTOMY WITH STAGING N/A 02/18/2022   Procedure: MINI LAPAROTOMY;  Surgeon: Lafonda Mosses, MD;  Location: [facility];  Service: Gynecology;  Laterality: N/A;   PARATHYROIDECTOMY N/A 01/23/2021   Procedure: NECK EXPLORATION WITH PARATHYROIDECTOMY;  Surgeon: Armandina Gemma, MD;  Location: [facility];  Service: General;  Laterality: N/A;   ROBOTIC ASSISTED TOTAL HYSTERECTOMY WITH BILATERAL SALPINGO OOPHERECTOMY N/A 02/18/2022   Procedure: XI ROBOTIC ASSISTED TOTAL HYSTERECTOMY WITH BILATERAL SALPINGO OOPHORECTOMY;  Surgeon: Lafonda Mosses, MD;  Location: [facility];  Service: Gynecology;  Laterality: N/A;   WISDOM TOOTH EXTRACTION      Family History  Problem Relation Age of Onset   Alzheimer's disease Father    Aneurysm Brother    Breast cancer Maternal Aunt 17   ALS Maternal Grandmother    Parkinson's disease Maternal Grandfather    Breast cancer Paternal Grandmother 65   Colon cancer Neg Hx    Colon polyps Neg Hx    Esophageal cancer Neg Hx    Rectal cancer Neg Hx    Stomach cancer Neg Hx     Social History   Socioeconomic History  Marital status: Widowed    Spouse name: Not on file   Number of children: Not on file   Years of education: Not on file   Highest education level: Not on file  Occupational History   Not on file  Tobacco Use   Smoking status: Never    Passive exposure: Past ("MOTHER DID")   Smokeless tobacco: Never  Vaping Use   Vaping Use: Never used  Substance and Sexual Activity    Alcohol use: Yes    Comment: socially   Drug use: Never   Sexual activity: Not Currently    Birth control/protection: Post-menopausal    Comment: 1st intercourse 76 yo-Fewer than 5 partners  Other Topics Concern   Not on file  Social History Narrative   Not on file   Social Determinants of Health   Financial Resource Strain: Not on file  Food Insecurity: Not on file  Transportation Needs: Not on file  Physical Activity: Not on file  Stress: Not on file  Social Connections: Not on file    Current Medications:  Current Outpatient Medications:    aspirin 81 MG tablet, Take 1 tablet (81 mg total) by mouth daily., Disp: 90 tablet, Rfl: 3   aspirin-acetaminophen-caffeine (EXCEDRIN MIGRAINE) 250-250-65 MG tablet, Take 1 tablet by mouth every 6 (six) hours as needed for headache., Disp: 30 tablet, Rfl: 0   cetirizine (ZYRTEC) 10 MG tablet, Take 10 mg by mouth daily as needed for allergies., Disp: , Rfl:    diphenhydramine-acetaminophen (TYLENOL PM) 25-500 MG TABS tablet, Take 1 tablet by mouth at bedtime as needed (sleep)., Disp: , Rfl:    famotidine (PEPCID) 10 MG tablet, Take 10 mg by mouth daily as needed for heartburn or indigestion., Disp: , Rfl:    fluticasone (FLONASE) 50 MCG/ACT nasal spray, Place 1 spray into both nostrils daily as needed for allergies or rhinitis., Disp: , Rfl:    irbesartan (AVAPRO) 150 MG tablet, TAKE 1 TABLET(150 MG) BY MOUTH DAILY, Disp: 90 tablet, Rfl: 3   levocetirizine (XYZAL) 5 MG tablet, Take 5 mg by mouth at bedtime., Disp: , Rfl:    levothyroxine (SYNTHROID) 88 MCG tablet, Take 88 mcg by mouth See admin instructions. Take 88 mcg by mouth on Tuesday, Wednesday and Thursday, Disp: , Rfl:    senna-docusate (SENOKOT-S) 8.6-50 MG tablet, Take 2 tablets by mouth at bedtime. For AFTER surgery, do not take if having diarrhea, Disp: 30 tablet, Rfl: 0   SYNTHROID 100 MCG tablet, Take 100 mcg by mouth See admin instructions. Take 100 mcg by mouth Friday-Monday,  Disp: , Rfl:    traMADol (ULTRAM) 50 MG tablet, Take 1 tablet (50 mg total) by mouth every 6 (six) hours as needed for severe pain. For AFTER surgery only, do not take and drive, Disp: 10 tablet, Rfl: 0  Review of Symptoms: Pertinent positives as per HPI.  Physical Exam: Deferred given limitations of phone visit.  Laboratory & Radiologic Studies: A. UTERUS, CERVIX, LEFT FALLOPIAN TUBE AND OVARY, RESECTION:  - Cervix: Benign, nabothian cysts  - Endometrium: Benign inactive endometrium  - Myometrium: Adenomyosis, leiomyoma  - Left fallopian tube: Benign, paratubal cyst  - Left ovary: No significant pathologic changes   B. RIGHT FALLOPIAN TUBE AND OVARY, RESECTION:  - Benign fibroma  - Right fallopian tube: Benign, paratubal cyst   Assessment & Plan: Brenda Cobb is a 76 y.o. woman s/p TRH/BSO.  Doing well post-op. Reviewed continued expectations. Reviewed pathology again from surgery, she is happy with  this news. Aware of inperson follow-up.  I discussed the assessment and treatment plan with the patient. The patient was provided with an opportunity to ask questions and all were answered. The patient agreed with the plan and demonstrated an understanding of the instructions.   The patient was advised to call back or see an in-person evaluation if the symptoms worsen or if the condition fails to improve as anticipated.   8 minutes of total time was spent for this patient encounter, including preparation, phone counseling with the patient and coordination of care, and documentation of the encounter.   Jeral Pinch, MD  Division of Gynecologic Oncology  Department of Obstetrics and Gynecology  [facility]

## 2022-03-09 ENCOUNTER — Encounter: Payer: Self-pay | Admitting: Gynecologic Oncology

## 2022-03-12 ENCOUNTER — Telehealth: Payer: Self-pay | Admitting: Surgery

## 2022-03-12 ENCOUNTER — Ambulatory Visit: Payer: Medicare Other | Admitting: Gynecologic Oncology

## 2022-03-12 NOTE — Telephone Encounter (Signed)
Patient called to reschedule post op visit due to illness. Rescheduled for 03/26/22 with Joylene John.

## 2022-03-26 ENCOUNTER — Inpatient Hospital Stay: Payer: Medicare Other | Attending: Gynecologic Oncology | Admitting: Gynecologic Oncology

## 2022-03-26 ENCOUNTER — Other Ambulatory Visit: Payer: Self-pay

## 2022-03-26 VITALS — BP 155/85 | HR 62 | Temp 98.0°F | Resp 14 | Wt 97.8 lb

## 2022-03-26 DIAGNOSIS — D270 Benign neoplasm of right ovary: Secondary | ICD-10-CM

## 2022-03-26 DIAGNOSIS — D259 Leiomyoma of uterus, unspecified: Secondary | ICD-10-CM

## 2022-03-26 NOTE — Progress Notes (Signed)
Gynecologic Oncology Post Op Follow Up  Brenda Cobb 77 y.o. female  CC:  Chief Complaint  Patient presents with   Uterine leiomyoma, unspecified location    HPI: Brenda Cobb is a 77 year old female initially seen in 06/2020. She was noted to have a pelvic mass on routine exam prior to her visit with GYN ONC.  Pelvic ultrasound revealed an enlarged uterus measuring 7.4 x 9.8 with pelvic mass associated with the lower uterine segment inferiorly into the right, measuring 9.9 x 11.6 cm, deviating the uterus to the left.  Endometrium approximately 4 mm but difficult to visualize secondary to the fibroid.  Normal bilateral ovaries.  MRI was then subsequently done on 05/29/2020 showing a large mass in the right pelvis, not clearly arising from the uterus but displacing it to the left.  Constellation of findings would favor an adnexal mass with little if any enhancement.  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  Fibrous more of the ovary and other low-grade ovarian neoplasms are also considered.  Thickening of the endometrium measuring 5 mm noted.  Small volume pelvic ascites noted.  Additionally, she had tumor markers with normal CA-125, 19 9, and a very mildly elevated CEA.   Given CEA, colonoscopy referral was suggested.  Patient underwent colonoscopy in March of 2023, which was normal.   Pelvic ultrasound was repeated in June 2022 showing a similar size uterus with again a right pedunculated mass, more consistent with a fibroid.  Endometrium measuring 6 mm, cannot rule out a polyp.  I had a phone visit with the patient at this time.  Given what was overall felt to be a benign process, discussed follow-up imaging at the end of the year and continued work-up with her GYN for thickened endometrium.   11/18/20: Patient underwent hysteroscopy with resection of polypoid lesions.  This showed endometrium with  focal simple hyperplasia without atypia.  Multiple fragments of endometrial polyp with inactive glands.  No atypia or malignancy.  Patient was started on Provera.   03/26/21: Office pelvic ultrasound performed showing lateral fibroid again, measuring 10 x 9.5 cm.  This is slightly decreased in size to last ultrasound.  Endometrial lining measures 3.4 mm.  Right ovary not seen, left ovary atrophic.  Small amount of free fluid.   11/05/21: Office pelvic ultrasound reveals lateral fibroid measuring 11.8 x 10.2 cm.  Asymmetrically thickened heterogenous endometrium measures 21.6 mm and is minimally vascular.  Left ovary is atrophic with small avascular calcifications.  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   Endometrial biopsy performed on 11/18/20 showing fragments of inactive endometrium, no malignancy.   12/24/2021: Office pelvic ultrasound shows no significant change noted in the fibroid.  Endometrial lining measures 6.2 mm.  At least 1 feeder vessel identified to the area of thickening, possible polyp.  No change in avascular multicystic mass in the right adnexa measuring 4 x 2.4 x 2.3 cm.  Pocket of free fluid noted measuring 6 x 4.8 cm.   CA-125 on 10/12 was 26 (had been 27 in 2022).  On 02/18/22, she underwent robotic-assisted laparoscopic total hysterectomy with bilateral salpingo-oophorectomy, mini laparotomy for right ovarian extraction by Dr. Jeral Pinch. Final pathology returned as: FINAL MICROSCOPIC DIAGNOSIS:   A. UTERUS, CERVIX, LEFT FALLOPIAN TUBE AND OVARY, RESECTION:  - Cervix: Benign, nabothian cysts  - Endometrium: Benign inactive endometrium  - Myometrium: Adenomyosis, leiomyoma  -  Left fallopian tube: Benign, paratubal cyst  - Left ovary: No significant pathologic changes   B. RIGHT FALLOPIAN TUBE AND OVARY, RESECTION:  - Benign fibroma  - Right fallopian tube: Benign, paratubal cyst   Pelvic washings  negative for malignant cells.   Her medical history is notable for mitral valve prolapse.  She was seen last year for consideration of valve repair; recommendation at that time was to continue with close surveillance. She sees Dr. Gwenlyn Found yearly.  Interval History: She presents today for post-op follow up. She reports doing well since surgery. She experienced itching under her left eye, arms, chest, and neck starting the night of surgery. No rash or hives reported. She had only taken tramadol twice and had never taken this before. She used benadryl 3-4 days and her symptoms improved. No spotting or drainage reported vaginally. Tolerating diet. Bowels and bladder functioning without difficulty. No concerns voiced.  Review of Systems: Additional review of systems negative   Current Meds:  Outpatient Encounter Medications as of 03/26/2022  Medication Sig   aspirin 81 MG tablet Take 1 tablet (81 mg total) by mouth daily.   aspirin-acetaminophen-caffeine (EXCEDRIN MIGRAINE) 250-250-65 MG tablet Take 1 tablet by mouth every 6 (six) hours as needed for headache.   cetirizine (ZYRTEC) 10 MG tablet Take 10 mg by mouth daily as needed for allergies.   diphenhydramine-acetaminophen (TYLENOL PM) 25-500 MG TABS tablet Take 1 tablet by mouth at bedtime as needed (sleep).   famotidine (PEPCID) 10 MG tablet Take 10 mg by mouth daily as needed for heartburn or indigestion.   fluticasone (FLONASE) 50 MCG/ACT nasal spray Place 1 spray into both nostrils daily as needed for allergies or rhinitis.   irbesartan (AVAPRO) 150 MG tablet TAKE 1 TABLET(150 MG) BY MOUTH DAILY   levocetirizine (XYZAL) 5 MG tablet Take 5 mg by mouth at bedtime.   levothyroxine (SYNTHROID) 100 MCG tablet Take 100 mcg by mouth See admin instructions.   senna-docusate (SENOKOT-S) 8.6-50 MG tablet Take 2 tablets by mouth at bedtime. For AFTER surgery, do not take if having diarrhea   SYNTHROID 100 MCG tablet Take 100 mcg by mouth See admin  instructions. Take 100 mcg by mouth Friday-Monday   traMADol (ULTRAM) 50 MG tablet Take 1 tablet (50 mg total) by mouth every 6 (six) hours as needed for severe pain. For AFTER surgery only, do not take and drive   No facility-administered encounter medications on file as of 03/26/2022.    Allergy: No Known Allergies  Social Hx:   Social History   Socioeconomic History   Marital status: Widowed    Spouse name: Not on file   Number of children: Not on file   Years of education: Not on file   Highest education level: Not on file  Occupational History   Not on file  Tobacco Use   Smoking status: Never    Passive exposure: Past ("MOTHER DID")   Smokeless tobacco: Never  Vaping Use   Vaping Use: Never used  Substance and Sexual Activity   Alcohol use: Yes    Comment: socially   Drug use: Never   Sexual activity: Not Currently    Birth control/protection: Post-menopausal    Comment: 1st intercourse 77 yo-Fewer than 5 partners  Other Topics Concern   Not on file  Social History Narrative   Not on file   Social Determinants of Health   Financial Resource Strain: Not on file  Food Insecurity: Not on file  Transportation Needs: Not  on file  Physical Activity: Not on file  Stress: Not on file  Social Connections: Not on file  Intimate Partner Violence: Not on file    Past Surgical Hx:  Past Surgical History:  Procedure Laterality Date   BREAST BIOPSY Left 2018   Benign   BREAST SURGERY Left 1990   Benign breast cyst   COLONOSCOPY     2023 at [place] N/A 11/18/2020   Procedure: [place];  Surgeon: Princess Bruins, MD;  Location: [place];  Service: Gynecology;  Laterality: N/A;   LAPAROTOMY WITH STAGING N/A 02/18/2022   Procedure: MINI LAPAROTOMY;  Surgeon: Lafonda Mosses, MD;  Location: [facility];  Service: Gynecology;  Laterality: N/A;   PARATHYROIDECTOMY  N/A 01/23/2021   Procedure: NECK EXPLORATION WITH PARATHYROIDECTOMY;  Surgeon: Armandina Gemma, MD;  Location: [facility];  Service: General;  Laterality: N/A;   ROBOTIC ASSISTED TOTAL HYSTERECTOMY WITH BILATERAL SALPINGO OOPHERECTOMY N/A 02/18/2022   Procedure: XI ROBOTIC ASSISTED TOTAL HYSTERECTOMY WITH BILATERAL SALPINGO OOPHORECTOMY;  Surgeon: Lafonda Mosses, MD;  Location: [facility];  Service: Gynecology;  Laterality: N/A;   WISDOM TOOTH EXTRACTION      Past Medical Hx:  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    cardiologist--- dr berry---  long hx of known mvp, requires antibiotic prophylaxis;  last echo in epic 05-21-2020 ef 60-65%, G2DD, bileaflet MV prolapse w/ severe MR, mild-mod TR, severe LAE;  per cardiology note pt is asymptomatic walks several miles daily   Osteoporosis    "RIGHT HIP BONE DENSITY LOSS AS OF 05/27/21",NOT ON MEDICATIONS"   Primary hyperparathyroidism [facility])    endocrinologist--- dr Chalmers Cater   Wears contact lenses     Family Hx:  Family History  Problem Relation Age of Onset   Alzheimer's disease Father    Aneurysm Brother    Breast cancer Maternal Aunt 39   ALS Maternal Grandmother    Parkinson's disease Maternal Grandfather    Breast cancer Paternal Grandmother 51   Colon cancer Neg Hx    Colon polyps Neg Hx    Esophageal cancer Neg Hx    Rectal cancer Neg Hx    Stomach cancer Neg Hx     Vitals:  Blood pressure (!) 155/85, pulse 62, temperature 98 F (36.7 C), temperature source Oral, resp. rate 14, weight 97 lb  12.8 oz (44.4 kg), SpO2 99 %.  Physical Exam:  General: Well developed, well nourished female in no acute distress. Alert and oriented x 3.  Cardiovascular: Regular rate and rhythm. S1 and S2 normal.  Lungs: Clear to auscultation bilaterally. No wheezes/crackles/rhonchi noted.  Skin: No rashes or lesions present. Abdomen: Abdomen soft, non-tender and non-obese. Active bowel sounds in all quadrants. No evidence of a fluid wave or abdominal masses. Abdominal incisions healing well without erythema or drainage.  Genitourinary:    Vulva/vagina: Normal external female genitalia. No lesions.    Urethra: No lesions or  masses    Vagina: Atrophic without any lesions. Vaginal cuff intact with suture material present, confirmed on bimanual. No palpable masses. No vaginal bleeding or drainage noted.  Extremities: No bilateral cyanosis, edema, or clubbing.   Assessment/Plan:  Brenda Cobb is a 77 year old female s/p robotic-assisted laparoscopic total hysterectomy with bilateral salpingo-oophorectomy, mini laparotomy for right ovarian extraction by Dr. Jeral Pinch on 02/18/22. 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 She is doing well post-operatively. No follow up needed with GYN Oncology. She is released back to follow with Dr. Dellis Filbert for her well woman care. She is advised to call for any needs, concerns, or new symptoms related to post-op recovery.    Dorothyann Gibbs, NP 04/03/2022, 10:28 AM

## 2022-03-26 NOTE — Patient Instructions (Signed)
Plan to continue follow up with Dr. Dellis Filbert for your well woman care.   No follow up necessary with Dr. Berline Lopes unless needed. Please call for any needs or concerns.  Continue with the no lifting restriction for the full 6 weeks. Nothing in the vagina for 8-10 weeks.

## 2022-04-03 ENCOUNTER — Encounter: Payer: Self-pay | Admitting: Gynecologic Oncology

## 2022-04-03 DIAGNOSIS — D270 Benign neoplasm of right ovary: Secondary | ICD-10-CM | POA: Insufficient documentation

## 2022-04-30 ENCOUNTER — Ambulatory Visit: Payer: Medicare Other | Attending: Cardiovascular Disease | Admitting: Cardiovascular Disease

## 2022-04-30 ENCOUNTER — Encounter: Payer: Self-pay | Admitting: Cardiovascular Disease

## 2022-04-30 VITALS — BP 134/76 | HR 78 | Ht 63.0 in | Wt 99.2 lb

## 2022-04-30 DIAGNOSIS — E782 Mixed hyperlipidemia: Secondary | ICD-10-CM | POA: Insufficient documentation

## 2022-04-30 DIAGNOSIS — I341 Nonrheumatic mitral (valve) prolapse: Secondary | ICD-10-CM | POA: Diagnosis not present

## 2022-04-30 MED ORDER — AMOXICILLIN 500 MG PO TABS: 2000.0000 mg | ORAL_TABLET | ORAL | 1 refills | Status: AC | PRN

## 2022-04-30 NOTE — Assessment & Plan Note (Signed)
History of mild hyperlipidemia lipid profile performed 08/15/2020 revealing total cholesterol 230, LDL 124 and HDL of 94.  At this point, I do not feel compelled to begin her on a statin drug.

## 2022-04-30 NOTE — Patient Instructions (Signed)
Medication Instructions:  Your physician recommends that you continue on your current medications as directed. Please refer to the Current Medication list given to you today.  *If you need a refill on your cardiac medications before your next appointment, please call your pharmacy*   Testing/Procedures: Your physician has requested that you have an echocardiogram. Echocardiography is a painless test that uses sound waves to create images of your heart. It provides your doctor with information about the size and shape of your heart and how well your heart's chambers and valves are working. This procedure takes approximately one hour. There are no restrictions for this procedure. Please do NOT wear cologne, perfume, aftershave, or lotions (deodorant is allowed). Please arrive 15 minutes prior to your appointment time. This procedure will be done at [address]. To be done in November.    Follow-Up: At [facility], you and your health needs are our priority.  As part of our continuing mission to provide you with exceptional heart care, we have created designated Provider Care Teams.  These Care Teams include your primary Cardiologist (physician) and Advanced Practice Providers (APPs -  Physician Assistants and Nurse Practitioners) who all work together to provide you with the care you need, when you need it.  We recommend signing up for the patient portal called "MyChart".  Sign up information is provided on this After Visit Summary.  MyChart is used to connect with patients for Virtual Visits (Telemedicine).  Patients are able to view lab/test results, encounter notes, upcoming appointments, etc.  Non-urgent messages can be sent to your provider as well.   To learn more about what you can do with MyChart, go to [email or web address].    Your next appointment:   12 month(s)  Provider:   Quay Burow, MD

## 2022-04-30 NOTE — Progress Notes (Signed)
04/30/2022 Brenda Cobb   30-Mar-1945  CM:[id number]  Primary Physician Princess Bruins, MD Primary Cardiologist: Lorretta Harp MD FACP, [place], [place], [place]  HPI:  Brenda Cobb is a 77 y.o.  thin and fit-appearing, married Caucasian female with no children who I last saw in the office 02/24/2021.Marland Kitchen She has a history of bileaflet severe mitral valve prolapse with regurgitation which she is totally asymptomatic from. She also has mild hyperlipidemia. Her recent 2D echo performed January 07, 2012, revealed normal LV size and function with mild concentric LVH, mildly dilated left atrium of 42 mm. Her echo really has not changed significantly since it was done prior to that.since I saw her a year ago she is completely asymptomatic. Recent 2-D echocardiogram performed  05/17/2019 showed severe bileaflet prolapse with moderate to severe MR and basal septal hypertrophy with severe left atrial enlargement unchanged from her prior echo.  She just got a new puppy, Springer spaniel, and [place] several miles at a time without limitation or symptoms.   She walks 3 miles a day 5 days a week without symptoms with her springer spaniel.  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   She had a cardiac MRI that did not show an intra-atrial mass.  She also saw Dr.Owen for cardiothoracic surgical evaluation for minimal invasive mitral valve repair.  He thought she was an acceptable candidate.  She is scheduled to have a TEE but this never was performed.  She remains asymptomatic walking 3 miles at a time with her new puppy.  Her LV function has remained normal in size and function.  She does have a uterine fibroid which they are watching and following conservatively.  She had a parathyroid removed by Dr. Harlow Asa.   Since I saw her in the office a  little over a year ago she is remained stable.  She did have a total abdominal hysterectomy and removal of the large fibroid at [facility] 02/18/2022 done as an outpatient.  She continues to walk 3 to 4 miles a day 5 to 7 days a week and is completely asymptomatic.  Her most recent 2D echo performed 02/01/2022 was unchanged from prior echo 05/21/2020 with normal LV systolic function and severe MR.  There is no evidence of pulmonary hypertension.   Current Meds  Medication Sig   aspirin 81 MG tablet Take 1 tablet (81 mg total) by mouth daily.   aspirin-acetaminophen-caffeine (EXCEDRIN MIGRAINE) 250-250-65 MG tablet Take 1 tablet by mouth every 6 (six) hours as needed for headache.   cetirizine (ZYRTEC) 10 MG tablet Take 10 mg by mouth daily as needed for allergies.   diphenhydramine-acetaminophen (TYLENOL PM) 25-500 MG TABS tablet Take 1 tablet by mouth at bedtime as needed (sleep).   famotidine (PEPCID) 10 MG tablet Take 10 mg by mouth daily as needed for heartburn or indigestion.   fluticasone (FLONASE) 50 MCG/ACT nasal spray Place 1 spray into both nostrils daily as needed for allergies or rhinitis.   irbesartan (AVAPRO) 150 MG tablet TAKE 1 TABLET(150 MG) BY MOUTH DAILY   levocetirizine (XYZAL) 5 MG tablet Take 5 mg by mouth at bedtime.   levothyroxine (SYNTHROID) 100 MCG tablet Take 100 mcg by mouth See admin instructions.   SYNTHROID 100 MCG tablet Take 100 mcg by mouth See admin  instructions. Take 100 mcg by mouth Friday-Monday     No Known Allergies  Social History   Socioeconomic History   Marital status: Widowed    Spouse name: Not on file   Number of children: Not on file   Years of education: Not on file   Highest education level: Not on file  Occupational History   Not on file  Tobacco Use   Smoking status: Never    Passive exposure: Past ("MOTHER DID")   Smokeless tobacco: Never  Vaping Use   Vaping Use: Never used  Substance and Sexual Activity   Alcohol use:  Yes    Comment: socially   Drug use: Never   Sexual activity: Not Currently    Birth control/protection: Post-menopausal    Comment: 1st intercourse 77 yo-Fewer than 5 partners  Other Topics Concern   Not on file  Social History Narrative   Not on file   Social Determinants of Health   Financial Resource Strain: Not on file  Food Insecurity: Not on file  Transportation Needs: Not on file  Physical Activity: Not on file  Stress: Not on file  Social Connections: Not on file  Intimate Partner Violence: Not on file     Review of Systems: General: negative for chills, fever, night sweats or weight changes.  Cardiovascular: negative for chest pain, dyspnea on exertion, edema, orthopnea, palpitations, paroxysmal nocturnal dyspnea or shortness of breath Dermatological: negative for rash Respiratory: negative for cough or wheezing Urologic: negative for hematuria Abdominal: negative for nausea, vomiting, diarrhea, bright red blood per rectum, melena, or hematemesis Neurologic: negative for visual changes, syncope, or dizziness All other systems reviewed and are otherwise negative except as noted above.    Blood pressure 134/76, pulse 78, height 5' 3"$  (1.6 m), weight 99 lb 3.2 oz (45 kg).  General appearance: alert and no distress Neck: no adenopathy, no JVD, supple, symmetrical, trachea midline, thyroid not enlarged, symmetric, no tenderness/mass/nodules, and soft right carotid bruit Lungs: clear to auscultation bilaterally Heart: Holosystolic murmur at the apex with a late systolic click consistent with mitral valve prolapse and severe MR. Extremities: extremities normal, atraumatic, no cyanosis or edema Pulses: 2+ and symmetric Skin: Skin color, texture, turgor normal. No rashes or lesions Neurologic: Grossly normal  EKG not performed today  ASSESSMENT AND PLAN:   MVP (mitral valve prolapse) History of mitral valve prolapse with severe MR with echo last checked 02/01/2022.   This is unchanged from her prior echo.  She is is completely asymptomatic.  She requires SBE prophylaxis for dental procedures.  Will continue to follow this on an annual basis.  Hyperlipidemia History of mild hyperlipidemia lipid profile performed 08/15/2020 revealing total cholesterol 230, LDL 124 and HDL of 94.  At this point, I do not feel compelled to begin her on a statin drug.     Lorretta Harp MD FACP,FACC,FAHA, [facility] 04/30/2022 10:07 AM

## 2022-04-30 NOTE — Assessment & Plan Note (Signed)
History of mitral valve prolapse with severe MR with echo last checked 02/01/2022.  This is unchanged from her prior echo.  She is is completely asymptomatic.  She requires SBE prophylaxis for dental procedures.  Will continue to follow this on an annual basis.

## 2022-05-23 ENCOUNTER — Other Ambulatory Visit: Payer: Self-pay | Admitting: Cardiovascular Disease

## 2022-07-15 ENCOUNTER — Encounter: Payer: Self-pay | Admitting: Obstetrics & Gynecology

## 2022-09-15 ENCOUNTER — Ambulatory Visit (INDEPENDENT_AMBULATORY_CARE_PROVIDER_SITE_OTHER): Payer: Medicare Other | Admitting: Obstetrics & Gynecology

## 2022-09-15 ENCOUNTER — Encounter: Payer: Self-pay | Admitting: Obstetrics & Gynecology

## 2022-09-15 VITALS — BP 110/70 | HR 68 | Resp 16

## 2022-09-15 DIAGNOSIS — M8589 Other specified disorders of bone density and structure, multiple sites: Secondary | ICD-10-CM | POA: Diagnosis not present

## 2022-09-15 DIAGNOSIS — Z90722 Acquired absence of ovaries, bilateral: Secondary | ICD-10-CM | POA: Diagnosis not present

## 2022-09-15 DIAGNOSIS — D270 Benign neoplasm of right ovary: Secondary | ICD-10-CM

## 2022-09-15 DIAGNOSIS — Z90710 Acquired absence of both cervix and uterus: Secondary | ICD-10-CM

## 2022-09-15 NOTE — Progress Notes (Signed)
    Brenda Cobb [birth year] [id number]        77 y.o.  G0  RP: Counseling on Gyn management  HPI: S/P Robotic TLH/BSO, minilap to extract the Rt Ovary with the large Fibroma on 02/18/2022 with Dr Pricilla Holm.  Patho Benign. Osteopenia T-Score -1.6 at Bilateral Total Hips on 06/2020.  Will repeat a BD here if the BD program restarts in 12/2022.  Mammo Neg 07/14/2022.  Patho 02/18/22: FINAL MICROSCOPIC DIAGNOSIS:   A. UTERUS, CERVIX, LEFT FALLOPIAN TUBE AND OVARY, RESECTION:  - Cervix: Benign, nabothian cysts  - Endometrium: Benign inactive endometrium  - Myometrium: Adenomyosis, leiomyoma  - Left fallopian tube: Benign, paratubal cyst  - Left ovary: No significant pathologic changes   B. RIGHT FALLOPIAN TUBE AND OVARY, RESECTION:  - Benign fibroma  - Right fallopian tube: Benign, paratubal cyst   INTRAOPERATIVE DIAGNOSIS:   A.  Uterus, cervix, left tube and ovary: "Benign endometrium with focal  crowding of glands"  Intraoperative diagnosis rendered by Dr. [place] Callas at 2:27 PM on 02/18/2022.   B.  Right tube and ovary: "Fibroma"  Intraoperative diagnosis rendered by Dr. [place] Callas at 2:50 PM on 02/18/2022.   OB History  Gravida Para Term Preterm AB Living  0            SAB IAB Ectopic Multiple Live Births               Past medical history,surgical history, problem list, medications, allergies, family history and social history were all reviewed and documented in the EPIC chart.   Directed ROS with pertinent positives and negatives documented in the history of present illness/assessment and plan.  Exam:  Vitals:   09/15/22 1036  BP: 110/70  Pulse: 68  Resp: 16   General appearance:  Normal  Abdomen: Incisions well healed.  Gynecologic exam: Deferred   Assessment/Plan:  77 y.o. G0P0   1. H/O Ovarian fibroma, right S/P Robotic TLH/BSO, minilap to extract the Rt Ovary with the large Fibroma on 02/18/2022 with Dr Pricilla Holm.  Patho Benign. Osteopenia T-Score -1.6 at Bilateral Total  Hips on 06/2020.  Will repeat a BD here if the BD program restarts in 12/2022.  Mammo Neg 07/14/2022. Will f/u next year for Breast and Pelvic exam with Dr Edward Jolly.  2. H/O total hysterectomy with bilateral salpingo-oophorectomy (BSO)  3. Osteopenia of multiple sites  Osteopenia T-Score -1.6 at Bilateral Total Hips on 06/2020.  Will repeat a BD here if the BD program restarts in 12/2022.  Genia Del MD, 10:54 AM 09/15/2022

## 2023-02-02 ENCOUNTER — Ambulatory Visit (HOSPITAL_COMMUNITY): Payer: Medicare Other | Attending: Cardiovascular Disease

## 2023-02-02 DIAGNOSIS — I341 Nonrheumatic mitral (valve) prolapse: Secondary | ICD-10-CM | POA: Diagnosis not present

## 2023-02-02 DIAGNOSIS — I340 Nonrheumatic mitral (valve) insufficiency: Secondary | ICD-10-CM | POA: Insufficient documentation

## 2023-02-02 LAB — ECHOCARDIOGRAM COMPLETE
Area-P 1/2: 2.15 cm2
P 1/2 time: 512 ms
S' Lateral: 3.1 cm

## 2023-05-04 ENCOUNTER — Ambulatory Visit: Payer: Medicare Other | Admitting: Cardiovascular Disease

## 2023-05-19 ENCOUNTER — Encounter: Payer: Self-pay | Admitting: Cardiovascular Disease

## 2023-05-19 ENCOUNTER — Ambulatory Visit: Payer: Medicare Other | Attending: Cardiovascular Disease | Admitting: Cardiovascular Disease

## 2023-05-19 VITALS — BP 138/80 | HR 70 | Ht 63.0 in | Wt 96.2 lb

## 2023-05-19 DIAGNOSIS — Z136 Encounter for screening for cardiovascular disorders: Secondary | ICD-10-CM | POA: Insufficient documentation

## 2023-05-19 DIAGNOSIS — I10 Essential (primary) hypertension: Secondary | ICD-10-CM | POA: Diagnosis not present

## 2023-05-19 DIAGNOSIS — E782 Mixed hyperlipidemia: Secondary | ICD-10-CM | POA: Insufficient documentation

## 2023-05-19 DIAGNOSIS — I341 Nonrheumatic mitral (valve) prolapse: Secondary | ICD-10-CM | POA: Diagnosis present

## 2023-05-19 DIAGNOSIS — I340 Nonrheumatic mitral (valve) insufficiency: Secondary | ICD-10-CM | POA: Insufficient documentation

## 2023-05-19 MED ORDER — IRBESARTAN 150 MG PO TABS: ORAL_TABLET | ORAL | 3 refills | Status: AC

## 2023-05-19 NOTE — Assessment & Plan Note (Signed)
 History of mitral valve prolapse with severe MR which she has had for many years that we follow by 2D echo recently checked 02/02/2023.  She had normal LV systolic function.  She also had moderate to severe TR and severe asymmetric septal hypertrophy.  She is completely asymptomatic.  Will continue to follow her noninvasively.

## 2023-05-19 NOTE — Progress Notes (Signed)
 05/19/2023 Aurelio Brash   May 11, 1945  [id number]  Primary Physician Pcp, No Primary Cardiologist: Runell Gess MD Milagros Loll, [place], [place]  HPI:  Brenda Cobb is a 78 y.o.   thin and fit-appearing, married Caucasian female with no children who I last saw in the office 04/30/2022.Marland Kitchen She has a history of bileaflet severe mitral valve prolapse with regurgitation which she is totally asymptomatic from. She also has mild hyperlipidemia. Her recent 2D echo performed January 07, 2012, revealed normal LV size and function with mild concentric LVH, mildly dilated left atrium of 42 mm. Her echo really has not changed significantly since it was done prior to that.since I saw her a year ago she is completely asymptomatic. Recent 2-D echocardiogram performed  05/17/2019 showed severe bileaflet prolapse with moderate to severe MR and basal septal hypertrophy with severe left atrial enlargement unchanged from her prior echo.  She just got a new puppy, Springer spaniel, and [place] daily basis several miles at a time without limitation or symptoms.   She walks 3 miles a day 5 days a week without symptoms with her springer spaniel.  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   She had a cardiac MRI that did not show an intra-atrial mass.  She also saw Dr.Owen for cardiothoracic surgical evaluation for minimal invasive mitral valve repair.  He thought she was an acceptable candidate.  She is scheduled to have a TEE but this never was performed.  She remains asymptomatic walking 3 miles at a time with her new puppy.  Her LV function has remained normal in size and function.  She does have a uterine fibroid which they are watching and following conservatively.  She had a parathyroid removed by Dr. Gerrit Friends.   She did have a total abdominal hysterectomy and  removal of the large fibroid at [facility] 02/18/2022 done as an outpatient.  She continues to walk 3 to 4 miles a day 5 to 7 days a week and is completely asymptomatic.    Since I saw her a year ago she remained stable.  She is very active and continues to walk 3 to 4 miles a day 5 to 7 days a week.  Her most recent 2D echo is unchanged 02/02/2023 with severe MR, moderate MVP, moderate to severe TR, severe asymmetric septal hypertrophy.   Current Meds  Medication Sig   amoxicillin (AMOXIL) 500 MG tablet Take 4 tablets (2,000 mg total) by mouth as needed (for dental procedures). Take 1 hours prior to dental procedures.   aspirin 81 MG tablet Take 1 tablet (81 mg total) by mouth daily.   aspirin-acetaminophen-caffeine (EXCEDRIN MIGRAINE) 250-250-65 MG tablet Take 1 tablet by mouth every 6 (six) hours as needed for headache.   cetirizine (ZYRTEC) 10 MG tablet Take 10 mg by mouth daily as needed for allergies.   famotidine (PEPCID) 10 MG tablet Take 10 mg by mouth daily as needed for heartburn or indigestion.   fluticasone (FLONASE) 50 MCG/ACT nasal spray Place 1 spray into both nostrils daily as needed for allergies or rhinitis.   irbesartan (AVAPRO) 150 MG tablet TAKE 1 TABLET(150 MG) BY MOUTH DAILY   levocetirizine (XYZAL) 5 MG tablet Take 5 mg by mouth at bedtime.   levothyroxine (SYNTHROID) 100 MCG tablet Take 100 mcg by mouth  See admin instructions.     No Known Allergies  Social History   Socioeconomic History   Marital status: Widowed    Spouse name: Not on file   Number of children: Not on file   Years of education: Not on file   Highest education level: Not on file  Occupational History   Not on file  Tobacco Use   Smoking status: Never    Passive exposure: Past ("MOTHER DID")   Smokeless tobacco: Never  Vaping Use   Vaping status: Never Used  Substance and Sexual Activity   Alcohol use: Yes    Comment: socially   Drug use: Never   Sexual activity: Not  Currently    Birth control/protection: Post-menopausal    Comment: 1st intercourse 78 yo-Fewer than 5 partners  Other Topics Concern   Not on file  Social History Narrative   Not on file   Social Drivers of Health   Financial Resource Strain: Not on file  Food Insecurity: Not on file  Transportation Needs: Not on file  Physical Activity: Not on file  Stress: Not on file  Social Connections: Not on file  Intimate Partner Violence: Not on file     Review of Systems: General: negative for chills, fever, night sweats or weight changes.  Cardiovascular: negative for chest pain, dyspnea on exertion, edema, orthopnea, palpitations, paroxysmal nocturnal dyspnea or shortness of breath Dermatological: negative for rash Respiratory: negative for cough or wheezing Urologic: negative for hematuria Abdominal: negative for nausea, vomiting, diarrhea, bright red blood per rectum, melena, or hematemesis Neurologic: negative for visual changes, syncope, or dizziness All other systems reviewed and are otherwise negative except as noted above.    Blood pressure 138/80, pulse 70, height 5\' 3"  (1.6 m), weight 96 lb 3.2 oz (43.6 kg).  General appearance: alert and no distress Neck: no adenopathy, no carotid bruit, no JVD, supple, symmetrical, trachea midline, and thyroid not enlarged, symmetric, no tenderness/mass/nodules Lungs: clear to auscultation bilaterally Heart: 2/6 apical systolic murmur and 1/6 left parasternal murmur. Extremities: extremities normal, atraumatic, no cyanosis or edema Pulses: 2+ and symmetric Skin: Skin color, texture, turgor normal. 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  She had normal LV systolic function.  She also had moderate to severe TR and severe asymmetric septal hypertrophy.  She is completely asymptomatic.  Will continue to follow her noninvasively.  Hyperlipidemia History of hyperlipidemia not on statin therapy with lipid profile performed 08/20/2020 revealing total cholesterol of 230, LDL 124 and HDL of 94.  Essential hypertension History of essential hypertension with blood pressure measured today at 138/80.  She is on Avapro.     Runell Gess MD FACP,FACC,FAHA, [facility] 05/19/2023 11:02 AM

## 2023-05-19 NOTE — Assessment & Plan Note (Signed)
 History of hyperlipidemia not on statin therapy with lipid profile performed 08/20/2020 revealing total cholesterol of 230, LDL 124 and HDL of 94.

## 2023-05-19 NOTE — Assessment & Plan Note (Signed)
 History of essential hypertension with blood pressure measured today at 138/80.  She is on Avapro.

## 2023-05-19 NOTE — Patient Instructions (Signed)
 Medication Instructions:  Your physician recommends that you continue on your current medications as directed. Please refer to the Current Medication list given to you today.  *If you need a refill on your cardiac medications before your next appointment, please call your pharmacy*   Lab Work: Your physician recommends that you you have labs drawn today: CMET, CBC & lipids  If you have labs (blood work) drawn today and your tests are completely normal, you will receive your results only by: MyChart Message (if you have MyChart) OR A paper copy in the mail If you have any lab test that is abnormal or we need to change your treatment, we will call you to review the results.   Testing/Procedures: Your physician has requested that you have an echocardiogram. Echocardiography is a painless test that uses sound waves to create images of your heart. It provides your doctor with information about the size and shape of your heart and how well your heart's chambers and valves are working. This procedure takes approximately one hour. There are no restrictions for this procedure. Please do NOT wear cologne, perfume, aftershave, or lotions (deodorant is allowed). Please arrive 15 minutes prior to your appointment time. **To do in November**  Please note: We ask at that you not bring children with you during ultrasound (echo/ vascular) testing. Due to room size and safety concerns, children are not allowed in the ultrasound rooms during exams. Our front office staff cannot provide observation of children in our lobby area while testing is being conducted. An adult accompanying a patient to their appointment will only be allowed in the ultrasound room at the discretion of the ultrasound technician under special circumstances. We apologize for any inconvenience.    Follow-Up: At [facility], you and your health needs are our priority.  As part of our continuing mission to provide you with  exceptional heart care, we have created designated Provider Care Teams.  These Care Teams include your primary Cardiologist (physician) and Advanced Practice Providers (APPs -  Physician Assistants and Nurse Practitioners) who all work together to provide you with the care you need, when you need it.  We recommend signing up for the patient portal called "MyChart".  Sign up information is provided on this After Visit Summary.  MyChart is used to connect with patients for Virtual Visits (Telemedicine).  Patients are able to view lab/test results, encounter notes, upcoming appointments, etc.  Non-urgent messages can be sent to your provider as well.   To learn more about what you can do with MyChart, go to [email or web address].    Your next appointment:   12 month(s)  Provider:   Nanetta Batty, MD     Other Instructions   1st Floor: - Lobby - Registration  - Pharmacy  - Lab - Cafe  2nd Floor: - PV Lab - Diagnostic Testing (echo, CT, nuclear med)  3rd Floor: - Vacant  4th Floor: - TCTS (cardiothoracic surgery) - AFib Clinic - Structural Heart Clinic - Vascular Surgery  - Vascular Ultrasound  5th Floor: - HeartCare Cardiology (general and EP) - Clinical Pharmacy for coumadin, hypertension, lipid, weight-loss medications, and med management appointments    Valet parking services will be available as well.

## 2023-05-20 LAB — COMPREHENSIVE METABOLIC PANEL
ALT: 15 IU/L (ref 0–32)
AST: 19 IU/L (ref 0–40)
Albumin: 4.6 g/dL (ref 3.8–4.8)
Alkaline Phosphatase: 64 IU/L (ref 44–121)
BUN/Creatinine Ratio: 16 (ref 12–28)
BUN: 14 mg/dL (ref 8–27)
Bilirubin Total: 0.5 mg/dL (ref 0.0–1.2)
CO2: 24 mmol/L (ref 20–29)
Calcium: 9.8 mg/dL (ref 8.7–10.3)
Chloride: 102 mmol/L (ref 96–106)
Creatinine, Ser: 0.86 mg/dL (ref 0.57–1.00)
Globulin, Total: 1.9 g/dL (ref 1.5–4.5)
Glucose: 91 mg/dL (ref 70–99)
Potassium: 5.4 mmol/L — ABNORMAL HIGH (ref 3.5–5.2)
Sodium: 143 mmol/L (ref 134–144)
Total Protein: 6.5 g/dL (ref 6.0–8.5)
eGFR: 70 mL/min/{1.73_m2} (ref >59)

## 2023-05-20 LAB — LIPID PANEL
Chol/HDL Ratio: 2 ratio (ref 0.0–4.4)
Cholesterol, Total: 212 mg/dL — ABNORMAL HIGH (ref 100–199)
HDL: 105 mg/dL (ref >39)
LDL Chol Calc (NIH): 95 mg/dL (ref 0–99)
Triglycerides: 70 mg/dL (ref 0–149)
VLDL Cholesterol Cal: 12 mg/dL (ref 5–40)

## 2023-05-20 LAB — CBC
Hematocrit: 37.7 % (ref 34.0–46.6)
Hemoglobin: 13.3 g/dL (ref 11.1–15.9)
MCH: 31.6 pg (ref 26.6–33.0)
MCHC: 35.3 g/dL (ref 31.5–35.7)
MCV: 90 fL (ref 79–97)
Platelets: 236 10*3/uL (ref 150–450)
RBC: 4.21 x10E6/uL (ref 3.77–5.28)
RDW: 12 % (ref 11.7–15.4)
WBC: 3.8 10*3/uL (ref 3.4–10.8)

## 2023-07-21 ENCOUNTER — Encounter: Payer: Self-pay | Admitting: Obstetrics and Gynecology

## 2023-07-21 DIAGNOSIS — M816 Localized osteoporosis [Lequesne]: Secondary | ICD-10-CM

## 2023-07-21 DIAGNOSIS — M8580 Other specified disorders of bone density and structure, unspecified site: Secondary | ICD-10-CM

## 2023-09-14 ENCOUNTER — Ambulatory Visit: Admitting: Radiology

## 2023-11-23 ENCOUNTER — Encounter: Payer: Self-pay | Admitting: Obstetrics and Gynecology

## 2023-11-23 ENCOUNTER — Ambulatory Visit (INDEPENDENT_AMBULATORY_CARE_PROVIDER_SITE_OTHER): Admitting: Obstetrics and Gynecology

## 2023-11-23 VITALS — HR 2 | Ht 62.4 in | Wt 93.4 lb

## 2023-11-23 DIAGNOSIS — Z780 Asymptomatic menopausal state: Secondary | ICD-10-CM

## 2023-11-23 DIAGNOSIS — Z9189 Other specified personal risk factors, not elsewhere classified: Secondary | ICD-10-CM

## 2023-11-23 DIAGNOSIS — M8580 Other specified disorders of bone density and structure, unspecified site: Secondary | ICD-10-CM

## 2023-11-23 DIAGNOSIS — Z01419 Encounter for gynecological examination (general) (routine) without abnormal findings: Secondary | ICD-10-CM

## 2023-11-23 DIAGNOSIS — N952 Postmenopausal atrophic vaginitis: Secondary | ICD-10-CM

## 2023-11-23 NOTE — Progress Notes (Addendum)
 78 y.o. y.o. female here for established annual medicare gyn exam. Lives in [place] doctors are here  No LMP recorded. Patient has had a hysterectomy.   H/o graves with RAI H/o hyperparathyroid with removal of one- normal labs now followed by endocrinology PAP-04/25/20 normal Mammo-07/14/23 Dexa-06/24/20 repeat at [facility]. Referral placed. No fractures Colonoscopy-06/10/21 Dermatology: has annual checks  S/P Robotic TLH/BSO, minilap to extract the Rt Ovary with the large Fibroma on 02/18/2022 with Dr Viktoria.  Patho Benign. Osteopenia T-Score -1.6 at Bilateral Total Hips on 06/2020 frax 8.6, 1.7.  Will repeat ordered.  Mammo Neg 07/14/2022.  Patho 02/18/22: FINAL MICROSCOPIC DIAGNOSIS:   A. UTERUS, CERVIX, LEFT FALLOPIAN TUBE AND OVARY, RESECTION:  - Cervix: Benign, nabothian cysts  - Endometrium: Benign inactive endometrium  - Myometrium: Adenomyosis, leiomyoma  - Left fallopian tube: Benign, paratubal cyst  - Left ovary: No significant pathologic changes   B. RIGHT FALLOPIAN TUBE AND OVARY, RESECTION:  - Benign fibroma  - Right fallopian tube: Benign, paratubal cyst   INTRAOPERATIVE DIAGNOSIS:   A.  Uterus, cervix, left tube and ovary: Benign endometrium with focal  crowding of glands  Intraoperative diagnosis rendered by Dr. Frutoso at 2:27 PM on 02/18/2022.   B.  Right tube and ovary: Fibroma  Intraoperative diagnosis rendered by Dr. Frutoso at 2:50 PM on 02/18/2022.  Body mass index is 16.86 kg/m.      No data to display          Pulse (!) 2, height 5' 2.4 (1.585 m), weight 93 lb 6.4 oz (42.4 kg).  No results found for: DIAGPAP, HPVHIGH, ADEQPAP  GYN HISTORY: No results found for: DIAGPAP, HPVHIGH, ADEQPAP  OB History  Gravida Para Term Preterm AB Living  0       SAB IAB Ectopic Multiple Live Births          Past Medical History:  Diagnosis Date   Allergy    SEASONAL   Endometrial polyp    History of COVID-19 07/22/2020   positive test result in  epic, pt was tested prior to procedure, per pt asymptomatic   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   post RAI   Hyperlipidemia    Hypertension    PT.DENIES AS OF 05/27/21,STATED IT RUNS LOW   Hypothyroidism, postradioiodine therapy    endocrinologist--  dr tommas---  post RAI 10/ 2007 for grave's   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    cardiologist--- dr berry---  long hx of known mvp, requires antibiotic prophylaxis;  last echo in epic 05-21-2020 ef 60-65%, G2DD, bileaflet MV prolapse w/ severe MR, mild-mod TR, severe LAE;  per cardiology note pt is asymptomatic walks several miles daily   Osteoporosis    RIGHT HIP BONE DENSITY LOSS AS OF 05/27/21,NOT ON MEDICATIONS   Primary hyperparathyroidism [facility])    endocrinologist--- dr tommas   Wears contact lenses     Past Surgical History:  Procedure Laterality Date   BREAST BIOPSY Left 2018   Benign   BREAST SURGERY Left 1990   Benign breast cyst   COLONOSCOPY     2023 at [facility]   DILATATION & CURETTAGE/HYSTEROSCOPY WITH MYOSURE N/A 11/18/2020   Procedure: DILATATION & CURETTAGE/HYSTEROSCOPY WITH MYOSURE;  Surgeon: Lavoie, Marie-Lyne, MD;  Location: [facility];  Service: Gynecology;  Laterality: N/A;   LAPAROTOMY WITH STAGING N/A 02/18/2022   Procedure: MINI LAPAROTOMY;  Surgeon: Viktoria Comer SAUNDERS, MD;  Location: [facility];  Service: Gynecology;  Laterality: N/A;   PARATHYROIDECTOMY  N/A 01/23/2021   Procedure: NECK EXPLORATION WITH PARATHYROIDECTOMY;  Surgeon: Eletha Boas, MD;  Location: [facility];  Service: General;  Laterality: N/A;   ROBOTIC ASSISTED TOTAL HYSTERECTOMY WITH BILATERAL SALPINGO OOPHERECTOMY N/A 02/18/2022   Procedure: XI ROBOTIC ASSISTED TOTAL HYSTERECTOMY WITH BILATERAL SALPINGO OOPHORECTOMY;  Surgeon: Viktoria Comer SAUNDERS, MD;  Location: [facility];  Service: Gynecology;  Laterality: N/A;   WISDOM TOOTH EXTRACTION      Current Outpatient Medications on File Prior to Visit  Medication Sig Dispense Refill    amoxicillin  (AMOXIL ) 500 MG tablet Take 4 tablets (2,000 mg total) by mouth as needed (for dental procedures). Take 1 hours prior to dental procedures. 16 tablet 1   aspirin  81 MG tablet Take 1 tablet (81 mg total) by mouth daily. 90 tablet 3   irbesartan  (AVAPRO ) 150 MG tablet TAKE 1 TABLET(150 MG) BY MOUTH DAILY 90 tablet 3   levothyroxine  (SYNTHROID ) 100 MCG tablet Take 100 mcg by mouth See admin instructions.     No current facility-administered medications on file prior to visit.    Social History   Socioeconomic History   Marital status: Widowed    Spouse name: Not on file   Number of children: Not on file   Years of education: Not on file   Highest education level: Not on file  Occupational History   Not on file  Tobacco Use   Smoking status: Never    Passive exposure: Past (MOTHER DID)   Smokeless tobacco: Never  Vaping Use   Vaping status: Never Used  Substance and Sexual Activity   Alcohol use: Yes    Comment: socially   Drug use: Never   Sexual activity: Not Currently    Birth control/protection: Post-menopausal    Comment: 1st intercourse 78 yo-Fewer than 5 partners  Other Topics Concern   Not on file  Social History Narrative   Not on file   Social Drivers of Health   Financial Resource Strain: Not on file  Food Insecurity: Not on file  Transportation Needs: Not on file  Physical Activity: Not on file  Stress: Not on file  Social Connections: Not on file  Intimate Partner Violence: Not on file    Family History  Problem Relation Age of Onset   Alzheimer's disease Father    Aneurysm Brother    Breast cancer Maternal Aunt 50   ALS Maternal Grandmother    Parkinson's disease Maternal Grandfather    Breast cancer Paternal Grandmother 62   Colon cancer Neg Hx    Colon polyps Neg Hx    Esophageal cancer Neg Hx    Rectal cancer Neg Hx    Stomach cancer Neg Hx      No Known Allergies    Patient's last menstrual period was No LMP recorded.  Patient has had a hysterectomy..            Review of Systems Alls systems reviewed and are negative.     Physical Exam Constitutional:      Appearance: Normal appearance.  Genitourinary:     Vulva normal.     No lesions in the vagina.     Right Labia: No rash, lesions or skin changes.    Left Labia: No lesions, skin changes or rash.    Vaginal cuff intact.    No vaginal discharge or tenderness.     No vaginal prolapse present.    Mild vaginal atrophy present.     Right Adnexa: absent.    Left Adnexa:  absent.    Cervix is absent.     Uterus is absent.  Breasts:    Right: Normal.     Left: Normal.  HENT:     Head: Normocephalic.  Neck:     Thyroid : No thyroid  mass, thyromegaly or thyroid  tenderness.  Cardiovascular:     Rate and Rhythm: Normal rate and regular rhythm.     Heart sounds: Normal heart sounds, S1 normal and S2 normal.  Pulmonary:     Effort: Pulmonary effort is normal.     Breath sounds: Normal breath sounds and air entry.  Abdominal:     General: Bowel sounds are normal. There is no distension.     Palpations: Abdomen is soft. There is no mass.     Tenderness: There is no abdominal tenderness. There is no guarding or rebound.  Musculoskeletal:     Cervical back: Full passive range of motion without pain, normal range of motion and neck supple. No tenderness.     Right lower leg: No edema.     Left lower leg: No edema.  Neurological:     Mental Status: She is alert.  Skin:    General: Skin is warm.  Psychiatric:        Mood and Affect: Mood normal.        Behavior: Behavior normal.        Thought Content: Thought content normal.  Vitals and nursing note reviewed. Exam conducted with a chaperone present.    Geni, CMA was present for the entire exam today   A:         [facility] GYN medicare exam                             P:        Pap smear not indicated Encouraged annual mammogram screening Colon cancer screening up-to-date DXA ordered  today Labs and immunizations to do with PMD Discussed breast self exams Encouraged healthy lifestyle practices Encouraged Vit D and Calcium   No follow-ups on file.  Brenda Cobb

## 2023-12-20 LAB — HM DEXA SCAN

## 2023-12-21 ENCOUNTER — Other Ambulatory Visit: Payer: Self-pay | Admitting: Obstetrics and Gynecology

## 2023-12-21 ENCOUNTER — Ambulatory Visit: Payer: Self-pay | Admitting: Obstetrics and Gynecology

## 2023-12-21 DIAGNOSIS — M810 Age-related osteoporosis without current pathological fracture: Secondary | ICD-10-CM

## 2023-12-21 MED ORDER — JUBBONTI 60 MG/ML ~~LOC~~ SOSY: 60.0000 mL | PREFILLED_SYRINGE | SUBCUTANEOUS | 6 refills | Status: AC

## 2023-12-21 MED ORDER — DENOSUMAB 60 MG/ML ~~LOC~~ SOSY
60.0000 mg | PREFILLED_SYRINGE | Freq: Once | SUBCUTANEOUS | 0 refills | Status: AC
Start: 2023-12-21 — End: 2023-12-21

## 2023-12-28 ENCOUNTER — Other Ambulatory Visit: Payer: Self-pay | Admitting: *Deleted

## 2023-12-28 ENCOUNTER — Encounter: Payer: Self-pay | Admitting: *Deleted

## 2023-12-28 DIAGNOSIS — M810 Age-related osteoporosis without current pathological fracture: Secondary | ICD-10-CM

## 2023-12-29 NOTE — Telephone Encounter (Signed)
 Patient left message on triage line stating she went to [facility] to have labs drawn this morning, labs not available in the system.   Upon review, future labs entered for [facility], not released.  Vit D and CMET released.   Call returned to patient, left detailed message, ok per dpr. Advised lab orders were entered, not released. These have now been released. Apologized for any inconvenience, return call to [place] at [place] [phone], opt 5 if any additional assistance is needed.

## 2024-01-02 ENCOUNTER — Other Ambulatory Visit: Payer: Self-pay | Admitting: Obstetrics and Gynecology

## 2024-01-02 NOTE — Telephone Encounter (Signed)
 12/29/23 lab order requisitions printed and faxed to [facility] number provided.   Call placed to [facility] [phone], left message requesting return call to confirm labs received via fax. Return call to [facility] at [phone], opt 4 to confirm.     This message was sent via FAXCOM, a product from [employer]. http://www.biscom.com/                    -------Fax Transmission Report-------  To:               Recipient at [phone] Subject:          SECURE: Lab Orders Result:           The transmission was successful. Explanation:      All Pages Ok Pages Sent:       5 Connect Time:     1 minutes, 31 seconds Transmit Time:    01/02/2024 07:59 Transfer Rate:    14400 Status Code:      0000 Retry Count:      0 Job Id:           [id number] Unique Id:        [id number] Fax Line:         14 Fax Server:       [employer]

## 2024-01-03 LAB — COMPREHENSIVE METABOLIC PANEL WITH GFR
ALT: 18 IU/L (ref 0–32)
AST: 20 IU/L (ref 0–40)
Albumin: 4.7 g/dL (ref 3.8–4.8)
Alkaline Phosphatase: 67 IU/L (ref 49–135)
BUN/Creatinine Ratio: 24 (ref 12–28)
BUN: 22 mg/dL (ref 8–27)
Bilirubin Total: 0.6 mg/dL (ref 0.0–1.2)
CO2: 23 mmol/L (ref 20–29)
Calcium: 9.8 mg/dL (ref 8.7–10.3)
Chloride: 103 mmol/L (ref 96–106)
Creatinine, Ser: 0.9 mg/dL (ref 0.57–1.00)
Globulin, Total: 1.9 g/dL (ref 1.5–4.5)
Glucose: 75 mg/dL (ref 70–99)
Potassium: 4.8 mmol/L (ref 3.5–5.2)
Sodium: 141 mmol/L (ref 134–144)
Total Protein: 6.6 g/dL (ref 6.0–8.5)
eGFR: 66 mL/min/1.73 (ref >59)

## 2024-01-03 LAB — VITAMIN D 25 HYDROXY (VIT D DEFICIENCY, FRACTURES): Vit D, 25-Hydroxy: 37.4 ng/mL (ref 30.0–100.0)

## 2024-01-04 ENCOUNTER — Ambulatory Visit: Payer: Self-pay | Admitting: Obstetrics and Gynecology

## 2024-01-09 ENCOUNTER — Other Ambulatory Visit: Payer: Self-pay

## 2024-01-09 ENCOUNTER — Encounter: Payer: Self-pay | Admitting: Obstetrics and Gynecology

## 2024-01-09 NOTE — Telephone Encounter (Signed)
 Patient has prescriptions sent to [facility] on 12/21/23 for Jubontti and Prolia. No review of benefits.   Damien -enter medication referral and update

## 2024-01-16 ENCOUNTER — Encounter: Payer: Self-pay | Admitting: Obstetrics and Gynecology

## 2024-01-16 ENCOUNTER — Ambulatory Visit: Admitting: Obstetrics and Gynecology

## 2024-01-16 VITALS — BP 108/68 | HR 72 | Ht 63.0 in | Wt 93.0 lb

## 2024-01-16 DIAGNOSIS — M810 Age-related osteoporosis without current pathological fracture: Secondary | ICD-10-CM | POA: Diagnosis not present

## 2024-01-16 MED ORDER — DENOSUMAB-BBDZ 60 MG/ML ~~LOC~~ SOSY
60.0000 mg | PREFILLED_SYRINGE | Freq: Once | SUBCUTANEOUS | Status: AC
  Administered 2024-01-16: 60 mg via SUBCUTANEOUS

## 2024-01-16 NOTE — Progress Notes (Addendum)
   Acute Office Visit  Subjective:    Patient ID: Brenda Cobb, female    DOB: January 08, 1946, 78 y.o.   MRN: [id number]   HPI 78 y.o. presents today for Consult (Dexa-06/24/20 osteopenia frax 8.6, -1.7, repeat ordered at [facility]. No meds. No fractures/Risks h/o hyperparathyroid with removal of one/DEXA: 12/20/23 -2.1 frax pos 11, 3% positive frax + osteoporosis) Osteoporosis with positive frax. Both hips affected No fractures Patient is very active and doesn't want a fracture Has purchased from [facility] Jubbonti and would like to do this today No changes in her PMHX VitD37, ca 9.8, GFR66 Labs are normal for bone support   No LMP recorded. Patient has had a hysterectomy.    Review of Systems     Objective:    OBGyn Exam  BP 108/68 (BP Location: Left Arm, Patient Position: Sitting, Cuff Size: Normal)   Pulse 72   Ht 5' 3 (1.6 m)   Wt 93 lb (42.2 kg)   SpO2 99%   BMI 16.47 kg/m  Wt Readings from Last 3 Encounters:  01/16/24 93 lb (42.2 kg)  11/23/23 93 lb 6.4 oz (42.4 kg)  05/19/23 96 lb 3.2 oz (43.6 kg)        Assessment & Plan:  Osteoporosis Discussed osteopenia but with a positive frax 3% score 10 year risk of hip fracture, she is considered osteoporosis and treatment is indicated. Counseled on prolia/biosimilar Jubbonti discussed has dual action with the antiresorption and anabolic side, although anabolic effects are slower than evenity. She has not recent MI or stroke. She has good dentition and is taking calcium and Vit D. She was counseled of long term goal treatment with prolia but if she decides to change, will need to have an option ready. Discussed the medication can come off the receptors quicker than a bisphosphonate and fracture can occur. To repeat the bone density in one year from starting to ensure benefit and not decline. Discussed insurance will cover with medication initiation. To notify clinic of any new fractures or side effects.  Counseled on the r/b/a/I of  the medication.  30 minutes spent on reviewing records, imaging,  and one on one patient time and counseling patient and documentation Dr. Glennon Almarie MARLA Glennon

## 2024-01-19 ENCOUNTER — Other Ambulatory Visit (HOSPITAL_COMMUNITY): Payer: Self-pay

## 2024-03-22 ENCOUNTER — Ambulatory Visit (HOSPITAL_COMMUNITY)
Admission: RE | Admit: 2024-03-22 | Discharge: 2024-03-22 | Disposition: A | Discharge status: Home / Self Care | Source: Ambulatory Visit | Attending: Cardiovascular Disease | Admitting: Cardiovascular Disease

## 2024-03-22 DIAGNOSIS — I10 Essential (primary) hypertension: Secondary | ICD-10-CM | POA: Diagnosis present

## 2024-03-22 DIAGNOSIS — I341 Nonrheumatic mitral (valve) prolapse: Secondary | ICD-10-CM | POA: Insufficient documentation

## 2024-03-22 DIAGNOSIS — I340 Nonrheumatic mitral (valve) insufficiency: Secondary | ICD-10-CM | POA: Diagnosis present

## 2024-03-22 DIAGNOSIS — E782 Mixed hyperlipidemia: Secondary | ICD-10-CM | POA: Diagnosis present

## 2024-03-22 LAB — ECHOCARDIOGRAM COMPLETE
Area-P 1/2: 2.41 cm2
MV M vel: 6.6 m/s
MV Peak grad: 174.2 mmHg
MV VTI: 0.68 cm2
P 1/2 time: 455 ms
S' Lateral: 3.4 cm

## 2024-03-23 ENCOUNTER — Ambulatory Visit: Payer: Self-pay | Admitting: Cardiovascular Disease
